# Patient Record
Sex: Female | Born: 1940 | Race: White | Hispanic: No | Smoking: Never smoker
Health system: Southern US, Community
[De-identification: ages and names within clinical notes are randomized; demographics above are authoritative.]

## PROBLEM LIST (undated history)

## (undated) DIAGNOSIS — R0600 Dyspnea, unspecified: Secondary | ICD-10-CM

## (undated) DIAGNOSIS — K5790 Diverticulosis of intestine, part unspecified, without perforation or abscess without bleeding: Secondary | ICD-10-CM

## (undated) DIAGNOSIS — M1990 Unspecified osteoarthritis, unspecified site: Secondary | ICD-10-CM

## (undated) DIAGNOSIS — R413 Other amnesia: Secondary | ICD-10-CM

## (undated) DIAGNOSIS — K219 Gastro-esophageal reflux disease without esophagitis: Secondary | ICD-10-CM

## (undated) DIAGNOSIS — I10 Essential (primary) hypertension: Secondary | ICD-10-CM

## (undated) DIAGNOSIS — Z800 Family history of malignant neoplasm of digestive organs: Secondary | ICD-10-CM

## (undated) DIAGNOSIS — K625 Hemorrhage of anus and rectum: Secondary | ICD-10-CM

## (undated) DIAGNOSIS — E785 Hyperlipidemia, unspecified: Secondary | ICD-10-CM

## (undated) HISTORY — PX: COLONOSCOPY: SHX174

## (undated) HISTORY — DX: Other amnesia: R41.3

## (undated) HISTORY — PX: TOTAL KNEE ARTHROPLASTY: SHX125

## (undated) HISTORY — PX: VAGINAL HYSTERECTOMY: SUR661

## (undated) HISTORY — PX: NASAL SEPTUM SURGERY: SHX37

## (undated) HISTORY — DX: Unspecified osteoarthritis, unspecified site: M19.90

## (undated) HISTORY — DX: Hyperlipidemia, unspecified: E78.5

## (undated) HISTORY — DX: Essential (primary) hypertension: I10

## (undated) HISTORY — DX: Gastro-esophageal reflux disease without esophagitis: K21.9

## (undated) HISTORY — DX: Diverticulosis of intestine, part unspecified, without perforation or abscess without bleeding: K57.90

## (undated) HISTORY — DX: Family history of malignant neoplasm of digestive organs: Z80.0

## (undated) HISTORY — PX: CARPAL TUNNEL RELEASE: SHX101

## (undated) HISTORY — PX: BREAST BIOPSY: SHX20

## (undated) HISTORY — PX: KNEE ARTHROSCOPY: SHX127

---
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
## 2004-10-04 ENCOUNTER — Encounter: Admission: RE | Admit: 2004-10-04 | Discharge: 2005-01-02 | Payer: Self-pay | Admitting: Cardiovascular Disease

## 2005-02-14 ENCOUNTER — Other Ambulatory Visit: Admission: RE | Admit: 2005-02-14 | Discharge: 2005-02-14 | Payer: Self-pay | Admitting: Obstetrics & Gynecology

## 2005-08-23 ENCOUNTER — Emergency Department (HOSPITAL_COMMUNITY): Admission: EM | Admit: 2005-08-23 | Discharge: 2005-08-24 | Payer: Self-pay | Admitting: Emergency Medicine

## 2006-04-18 ENCOUNTER — Ambulatory Visit: Payer: Self-pay | Admitting: Internal Medicine

## 2006-05-30 ENCOUNTER — Ambulatory Visit: Payer: Self-pay | Admitting: Internal Medicine

## 2006-12-25 ENCOUNTER — Ambulatory Visit: Payer: Self-pay | Admitting: Internal Medicine

## 2006-12-25 LAB — CONVERTED CEMR LAB
Basophils Absolute: 0.4 10*3/uL — ABNORMAL HIGH (ref 0.0–0.1)
Basophils Relative: 4.2 % — ABNORMAL HIGH (ref 0.0–1.0)
CO2: 27 meq/L (ref 19–32)
Creatinine, Ser: 0.7 mg/dL (ref 0.4–1.2)
Eosinophils Absolute: 0.3 10*3/uL (ref 0.0–0.6)
GFR calc Af Amer: 108 mL/min
GFR calc non Af Amer: 89 mL/min
Glucose, Bld: 96 mg/dL (ref 70–99)
Lymphocytes Relative: 18.6 % (ref 12.0–46.0)
MCHC: 34.4 g/dL (ref 30.0–36.0)
Monocytes Absolute: 0.5 10*3/uL (ref 0.2–0.7)
Platelets: 234 10*3/uL (ref 150–400)
RBC: 4.2 M/uL (ref 3.87–5.11)
Sodium: 138 meq/L (ref 135–145)
WBC: 9.1 10*3/uL (ref 4.5–10.5)

## 2007-01-02 ENCOUNTER — Ambulatory Visit (HOSPITAL_COMMUNITY): Admission: RE | Admit: 2007-01-02 | Discharge: 2007-01-02 | Payer: Self-pay | Admitting: Internal Medicine

## 2007-02-27 HISTORY — PX: TOTAL KNEE ARTHROPLASTY: SHX125

## 2007-04-22 DIAGNOSIS — E119 Type 2 diabetes mellitus without complications: Secondary | ICD-10-CM

## 2007-04-22 DIAGNOSIS — M129 Arthropathy, unspecified: Secondary | ICD-10-CM | POA: Insufficient documentation

## 2007-04-22 DIAGNOSIS — K5730 Diverticulosis of large intestine without perforation or abscess without bleeding: Secondary | ICD-10-CM | POA: Insufficient documentation

## 2007-04-22 DIAGNOSIS — J329 Chronic sinusitis, unspecified: Secondary | ICD-10-CM | POA: Insufficient documentation

## 2007-04-22 DIAGNOSIS — E785 Hyperlipidemia, unspecified: Secondary | ICD-10-CM

## 2007-04-22 DIAGNOSIS — R030 Elevated blood-pressure reading, without diagnosis of hypertension: Secondary | ICD-10-CM | POA: Insufficient documentation

## 2007-04-22 DIAGNOSIS — E079 Disorder of thyroid, unspecified: Secondary | ICD-10-CM | POA: Insufficient documentation

## 2007-09-22 ENCOUNTER — Telehealth: Payer: Self-pay | Admitting: Internal Medicine

## 2007-10-15 ENCOUNTER — Ambulatory Visit: Payer: Self-pay | Admitting: Internal Medicine

## 2007-12-18 ENCOUNTER — Inpatient Hospital Stay (HOSPITAL_COMMUNITY): Admission: RE | Admit: 2007-12-18 | Discharge: 2007-12-20 | Payer: Self-pay | Admitting: Obstetrics & Gynecology

## 2007-12-18 ENCOUNTER — Encounter (INDEPENDENT_AMBULATORY_CARE_PROVIDER_SITE_OTHER): Payer: Self-pay | Admitting: Obstetrics & Gynecology

## 2008-03-08 ENCOUNTER — Inpatient Hospital Stay (HOSPITAL_COMMUNITY): Admission: RE | Admit: 2008-03-08 | Discharge: 2008-03-11 | Payer: Self-pay | Admitting: Orthopedic Surgery

## 2008-03-26 ENCOUNTER — Ambulatory Visit: Payer: Self-pay | Admitting: Surgery

## 2008-03-26 ENCOUNTER — Ambulatory Visit: Admission: RE | Admit: 2008-03-26 | Discharge: 2008-03-26 | Payer: Self-pay | Admitting: Orthopedic Surgery

## 2008-03-26 ENCOUNTER — Encounter (INDEPENDENT_AMBULATORY_CARE_PROVIDER_SITE_OTHER): Payer: Self-pay | Admitting: Orthopedic Surgery

## 2009-02-04 ENCOUNTER — Encounter: Admission: RE | Admit: 2009-02-04 | Discharge: 2009-02-04 | Payer: Self-pay | Admitting: Family Medicine

## 2010-04-14 ENCOUNTER — Other Ambulatory Visit: Payer: Self-pay | Admitting: Orthopedic Surgery

## 2010-04-14 DIAGNOSIS — M25552 Pain in left hip: Secondary | ICD-10-CM

## 2010-04-16 ENCOUNTER — Ambulatory Visit
Admission: RE | Admit: 2010-04-16 | Discharge: 2010-04-16 | Disposition: A | Discharge status: Home / Self Care | Payer: Medicare Other | Source: Ambulatory Visit | Attending: Orthopedic Surgery | Admitting: Orthopedic Surgery

## 2010-04-16 DIAGNOSIS — M25552 Pain in left hip: Secondary | ICD-10-CM

## 2010-06-12 LAB — GLUCOSE, CAPILLARY
Glucose-Capillary: 148 mg/dL — ABNORMAL HIGH (ref 70–99)
Glucose-Capillary: 150 mg/dL — ABNORMAL HIGH (ref 70–99)
Glucose-Capillary: 153 mg/dL — ABNORMAL HIGH (ref 70–99)
Glucose-Capillary: 156 mg/dL — ABNORMAL HIGH (ref 70–99)
Glucose-Capillary: 162 mg/dL — ABNORMAL HIGH (ref 70–99)
Glucose-Capillary: 172 mg/dL — ABNORMAL HIGH (ref 70–99)
Glucose-Capillary: 286 mg/dL — ABNORMAL HIGH (ref 70–99)
Glucose-Capillary: 96 mg/dL (ref 70–99)

## 2010-06-12 LAB — ABO/RH: ABO/RH(D): O POS

## 2010-06-12 LAB — CBC
HCT: 27.7 % — ABNORMAL LOW (ref 36.0–46.0)
HCT: 37.1 % (ref 36.0–46.0)
Hemoglobin: 9.4 g/dL — ABNORMAL LOW (ref 12.0–15.0)
MCHC: 33.2 g/dL (ref 30.0–36.0)
MCHC: 33.5 g/dL (ref 30.0–36.0)
MCHC: 33.8 g/dL (ref 30.0–36.0)
MCV: 97.5 fL (ref 78.0–100.0)
Platelets: 168 10*3/uL (ref 150–400)
Platelets: 183 10*3/uL (ref 150–400)
RBC: 3.24 MIL/uL — ABNORMAL LOW (ref 3.87–5.11)
RDW: 13.4 % (ref 11.5–15.5)
WBC: 6.5 10*3/uL (ref 4.0–10.5)
WBC: 8.1 10*3/uL (ref 4.0–10.5)
WBC: 9.5 10*3/uL (ref 4.0–10.5)

## 2010-06-12 LAB — COMPREHENSIVE METABOLIC PANEL
ALT: 39 U/L — ABNORMAL HIGH (ref 0–35)
Albumin: 4 g/dL (ref 3.5–5.2)
Alkaline Phosphatase: 69 U/L (ref 39–117)
BUN: 15 mg/dL (ref 6–23)
CO2: 26 mEq/L (ref 19–32)
Glucose, Bld: 124 mg/dL — ABNORMAL HIGH (ref 70–99)
Potassium: 3.6 mEq/L (ref 3.5–5.1)
Total Bilirubin: 0.6 mg/dL (ref 0.3–1.2)

## 2010-06-12 LAB — BASIC METABOLIC PANEL
BUN: 15 mg/dL (ref 6–23)
BUN: 22 mg/dL (ref 6–23)
CO2: 28 mEq/L (ref 19–32)
CO2: 33 mEq/L — ABNORMAL HIGH (ref 19–32)
Chloride: 101 mEq/L (ref 96–112)
Chloride: 96 mEq/L (ref 96–112)
Creatinine, Ser: 0.64 mg/dL (ref 0.4–1.2)
Creatinine, Ser: 1.1 mg/dL (ref 0.4–1.2)
GFR calc Af Amer: 52 mL/min — ABNORMAL LOW (ref >60)
GFR calc Af Amer: >60 mL/min (ref >60)
GFR calc non Af Amer: 43 mL/min — ABNORMAL LOW (ref >60)
Glucose, Bld: 130 mg/dL — ABNORMAL HIGH (ref 70–99)
Glucose, Bld: 178 mg/dL — ABNORMAL HIGH (ref 70–99)
Potassium: 5 mEq/L (ref 3.5–5.1)
Potassium: 5.6 mEq/L — ABNORMAL HIGH (ref 3.5–5.1)
Sodium: 128 mEq/L — ABNORMAL LOW (ref 135–145)
Sodium: 132 mEq/L — ABNORMAL LOW (ref 135–145)
Sodium: 136 mEq/L (ref 135–145)

## 2010-06-12 LAB — PROTIME-INR
INR: 1 (ref 0.00–1.49)
Prothrombin Time: 12.9 seconds (ref 11.6–15.2)
Prothrombin Time: 15.2 seconds (ref 11.6–15.2)

## 2010-06-12 LAB — URINALYSIS, ROUTINE W REFLEX MICROSCOPIC
Bilirubin Urine: NEGATIVE
Hgb urine dipstick: NEGATIVE
pH: 6 (ref 5.0–8.0)

## 2010-06-12 LAB — APTT: aPTT: 22 seconds — ABNORMAL LOW (ref 24–37)

## 2010-07-11 NOTE — Assessment & Plan Note (Signed)
[facility]                         GASTROENTEROLOGY OFFICE NOTE   Michele, Dawson                       MRN:          [id number]  DATE:12/25/2006                            DOB:          07-08-1940    [name] is a 70 year old white Dawson who is an acute walk-in because  of rather acute left upper quadrant and left middle quadrant abdominal  pain that started about 48 hours ago, initially associated with  constipation, subsequently today with 4 diarrheal stools but no visible  blood. There has been no fever. Last colonoscopy in April 2008 showed  moderately severe diverticulosis of the sigmoid colon. This was  confirmed on CT scan of the abdomen in 2002 while patient lived in  [place]. She has had occasional attacks of the pain on this side. Since  she started on liquid diet and took Symax 0.375 mg twice a day, her  symptoms have improved in the last 24 hours. She is on Norvasc 5 mg a  day and lisinopril 20 mg b.i.d. Her blood sugars in the past week have  been somewhat elevated to over 120 to 150 while usually they are around  90. She is on metformin 500 mg 2 twice a day. There is a family history  of colon cancer in her sibling which gives her periodic colonoscopies  every 5 years.   PHYSICAL EXAMINATION:  Blood pressure 138/86, pulse 72 and weight 195  pounds. She was in no distress today.  LUNGS:  Were clear to auscultation.  COR:  With normal S1 and normal S2.  ABDOMEN:  Was soft but very tender in left upper quadrant, left middle  quadrant and also in left lower quadrant in the distribution of the left  colon. Right side and upper and lower quadrants were unremarkable. Bowel  sounds were normal. There was no distention.  RECTAL EXAM:  Was soft, hemoccult negative stool.   IMPRESSION:  A 70 year old white Dawson with acute onset abdominal pain  which is consistent with ischemic colitis versus attack of irritable  bowel syndrome.  Judging by the intensity of the pain and the physical  exam, I believe we are most likely dealing with ischemic colitis. She is  better today. One of the implicating factors in her colitis could be her  blood pressure medications which may cause reactive hypotensive. Her  usual blood pressures run 120/82. Today, it was somewhat elevated.   PLAN:  1. Full liquid diet for the next 24-48 hours.  2. CT scan of the abdomen and pelvis to look for ischemic colitis.  3. CBC and BMET today.  4. Continue Symax and antispasmodics twice a day.  5. Decrease Norvasc to 2.5 mg p.o. daily while continuing lisinopril.  6. Stay hydrated.  7. After reviewing the CT scan, I will touch base with the patient and      see how she is doing.     Michele Dawson. Michele Chance, MD  Electronically Signed    DMB/MedQ  DD: 12/25/2006  DT: 12/26/2006  Job #: [id number]   cc:   Michele Dawson, M.D.

## 2010-07-11 NOTE — Discharge Summary (Signed)
NAME:  Michele Dawson, Michele Dawson              ACCOUNT NO.:  [id number]   MEDICAL RECORD NO.:  [id number]          PATIENT TYPE:  INP   LOCATION:  1604                         FACILITY:  [facility]   PHYSICIAN:  Ollen Gross, M.D.    DATE OF BIRTH:  [birth year]   DATE OF ADMISSION:  03/08/2008  DATE OF DISCHARGE:  03/11/2008                               DISCHARGE SUMMARY   ADMITTING DIAGNOSES:  1. Bilateral knee osteoarthritis, left greater than right.  2. Hypertension.  3. Diabetes.  4. Reflux disease.  5. Hypothyroidism.   DISCHARGE DIAGNOSES:  1. Osteoarthritis left knee, status post left total replacement      arthroplasty.  2. Osteoarthritis right knee.  3. Postoperative hyponatremia, improved.  4. Mild postoperative renal insufficiency, improved.  5. Hypertension.  6. Diabetes.  7. Reflux disease.  8. Hypothyroidism.   PROCEDURE:  On March 08, 2008, left total knee.  Surgeon, Dr. Lequita Halt.  Assistant, Oneida Alar, PA-C.  Spinal anesthesia.   CONSULTS:  None.   BRIEF HISTORY:  Michele Dawson is a 70 year old female with end-stage  arthritis of the left knee with progressive worsening pain and  dysfunction, failed operative management, now presents for total knee  arthroplasty.   LABORATORY DATA:  Preop CBC showed a hemoglobin of 12.3, hematocrit  37.1, white cell count 6.5, platelets 183.  Chem panel, slightly  elevated glucose of 124, slightly elevated AST of 41 and slightly  elevated ALT of 39, otherwise Chem panel within normal limits.  PT/INR  12.9 and 1.0 with a PTT of 22.  Preop UA negative with a section of  trace leukocyte esterase and only 0-2 white cells.   Serial CBCs were followed.  Hemoglobin dropped down to 10.4, it got as  low as 9.4; last H and H with hemoglobin stabilized at 9.4 with  hematocrit of 28.3.  Serial BMETs were followed.  Sodium did drop down  to 132, got as low as 128, was back up to 133.  Preop creatinine was  normal at 0.6, did go up postoperatively to  1.2, came back down to 0.6.  Initial potassium was normal at 3.6, but it went up to 5.6 due to  potassium in the fluid.  We removed that and it came back down to 5.0.   EKG dated January 30, 2008, sinus rhythm, leftward axis confirmed.   HOSPITAL COURSE:  The patient admitted to [facility]  tolerated the procedure well, later transferred to the recovery room and  orthopedic floor.  Started on PCA and p.o. analgesics for pain control  following surgery.  She did have spinal with Duramorph added.  She had  some nausea through the night and on the morning of postop day #1.  It  was felt to be due to probably the Duramorph, which would wear off later  that day.  She had low urinary output and also some hypotension,  systolic in the 90s, so we bolused her with fluids.  She did have an ACE  inhibitor preop which was on hold.  Unfortunately, her normal creatinine  preoperatively of 0.61 went up to 1.2,  indicating some renal  insufficiency.  We did give her fluids that helped with her pressure.  That also increased her output.  She was started back on her home meds,  except the ACE inhibitor and the metformin held postoperatively.  Her  amlodipine was put on a parameter due to the low pressure.  She also had  some elevated potassium, but that was felt to be due to potassium with  fluids, possibly some hemolysis, so we just followed that and rechecked.  The potassium came back down.  By day 2, she was doing better.  The  nausea had resolved.  I figured it was the Duramorph had worn off.  She  started getting up out of bed, walking about 30 feet.  On dressing  change, the incision looked good.  Her creatinine was already back down  to a normal level of 0.6.  Her hemoglobin was down a little bit, down to  9.4.  We put her on some iron.  Renal insufficiency was improving.  Her  systolic was better, up above 841 systolic.  It is felt she is going to  need skilled facility.  She will look  into [facility].  We got the  social worker involved.  On the morning of day 3, the nausea had  completely resolved.  Her hemoglobin was stable at 94.4.  Output was  good, eating and drinking well, tolerating therapy and was transferred  over to [facility] at that time.   DISCHARGE/PLAN:  The patient was transferred over to [facility].   DISCHARGE DIAGNOSES:  Please see above.   DISCHARGE MEDICATIONS:  1. She is on Coumadin protocol.  Please titrate the Coumadin level for      a target INR between 2.0 and 3.0.  She needs to be on Coumadin for      3 weeks from the date of surgery of March 08, 2008.  Please see      bottom of the report for her Coumadin regimen in the hospital.  2. Colace 100 mg p.o. b.i.d.  3. Norvasc 10 mg p.o. daily.  We were holding for a systolic less than      130, due to her postop hypotension.  4. Lipitor 80 mg daily.  5. Synthroid 200 mcg p.o. daily.  6. Glucophage 100 mg p.o. b.i.d.  7. Nexium 40 mg daily.  8. Nu-Iron 150 mg p.o. daily.  She needs to be on this for 3 weeks      from the date of surgery of March 08, 2008, but may be      discontinued after 3 weeks.  9. Percocet 5 mg 1-2 every 4-6 hours need for moderate pain.  10.Tylenol 325 one or two every 4-6 hours as needed for mild pain.  11.Robaxin 500 mg p.o. q.6-8 h., p.r.n. spasm diet.   DIET:  Heart-healthy diabetic diet.   ACTIVITY:  She is weightbearing as tolerated to the left lower  extremity.  Continue gait training, ambulation, ADLs, PT and OT.  Total  knee protocol.  She may start showering; however, do not submerge the  incision under water.   FOLLOW UP:  She needs to follow up 2 weeks from the date of surgery.  Please contact the office at [phone] to help arrange appointment.  Follow-up with this patient [place] [facility] office of [facility].   DISPOSITION:  [facility].   CONDITION ON DISCHARGE:  Improving.   Coumadin regimen.  The INR on admission was  1.0.  She was a given a 5 mg  on the evening of surgery.  On postop day #1, her INR was 1.0.  She was  given another 5 mg.  On postop day #2, the INR was 1.2, slowly coming  up, so she was given 7.5 mg and on the morning of postop day #3, her INR  was slowly increased up to 1.5.      Alexzandrew L. Perkins, P.A.C.      Ollen Gross, M.D.  Electronically Signed    ALP/MEDQ  D:  03/11/2008  T:  03/11/2008  Job:  [id number]   cc:   Ollen Gross, M.D.  Fax: [phone]   Vikki Ports, M.D.  Fax: [phone]   Jake Bathe, MD  Fax: [phone]   [facility]

## 2010-07-11 NOTE — Op Note (Signed)
NAME:  Michele Dawson, Michele Dawson NO.:  [id number]   MEDICAL RECORD NO.:  [id number]          PATIENT TYPE:  INP   LOCATION:  9317                          FACILITY:  [facility]   PHYSICIAN:  Freddy Finner, M.D.   DATE OF BIRTH:  [birth year]   DATE OF PROCEDURE:  12/18/2007  DATE OF DISCHARGE:                               OPERATIVE REPORT   PREOPERATIVE DIAGNOSES:  Symptomatic pelvic relaxation with third-degree  cystocele, second-degree rectocele, second-degree uterine prolapse.   POSTOPERATIVE DIAGNOSES:  Symptomatic pelvic relaxation with third-  degree cystocele, second-degree rectocele, second-degree uterine  prolapse.   OPERATIVE PROCEDURE:  Laparoscopically assisted vaginal hysterectomy,  bilateral salpingo-oophorectomy, anterior and posterior vaginal repair,  sacrospinous ligament suspension of vagina, suprapubic cystotomy with  banana catheter.   ESTIMATED INTRAOPERATIVE BLOOD LOSS:  200 mL.   SURGEON:  Freddy Finner, M.D.   ASSISTANT:  Juluis Mire, M.D.   ANESTHESIA:  General endotracheal.   INTRAOPERATIVE COMPLICATIONS:  None.   DESCRIPTION OF PROCEDURE:  Details of present illness recorded in the  admission note.  The patient was admitted, given a bolus of antibiotic,  placed in serial compression devices on her lower extremities and  brought to the operating room.  She was placed under adequate general  endotracheal anesthesia and placed in the dorsal lithotomy position.  Allen stirrups were used.  Prep was carried out with Hibiclens because  of an allergy to iodine/shell fish.  Sterile drapes were applied.  The  bladder was evacuated with sterile technique.  Hulka tenaculum was  attached to the cervix.  Sterile drapes were applied.  Two small  incisions were made, one at the umbilicus and one just above the  symphysis and an 11-mm bladed disposable trocar was introduced at the  umbilicus while elevating the anterior abdominal wall manually.  Direct  inspection revealed adequate placement with no evidence of injury on  entry.  Pneumoperitoneum was allowed to accumulate using carbon dioxide  gas.  Exposure was compromised by obesity, but was adequate.  A 5-mm  trocar was placed under direct visualization through an incision just  above the symphysis.  Through it, a spring-loaded grasping forceps was  used.  Scanning inspection of the abdomen was carried out.  No apparent  abnormalities were noted either in the pelvis or in the upper abdomen.  The liver was visualized.  The appendix was not visualized.  Using the  Ensure device, the infundibulopelvic ligaments, round ligaments and  upper broad ligaments on each side were progressively sealed and  divided, releasing the tubes and ovaries.  Attention was turned  vaginally.  A posterior weighted retractor was placed.  Hulka was  replaced with a Christella Hartigan on the cervix.  Colpotomy incision was made while  tenting the mucosa posterior to the cervix.  A banana retractor was  placed.  The cervix was circumscribed with a scalpel.  The LigaSure  system was then used to develop uterosacral pedicles, bladder pillars.  The bladder was carefully advanced off the cervix.  The anterior  peritoneum was entered.  Cardinal ligament pedicles and vessel pedicles  were  sealed and divided using LigaSure.  This allowed delivery of the  uterus, tubes and ovaries.  The uterosacrals were plicated with an  interrupted 0 Monocryl.  The cuff was closed posterior one-half with  figure-of-eights of 0 Monocryl.  The edges of the anterior mucosa were  then grasped.  The mucosa was tented over the bladder in the midline.  With progressive sharp and blunt dissection, the bladder was freed from  the urogenital fascia.  Plication sutures of 0 Monocryl were then used  to reduce the cystocele and to elevate the urethrovesical angle.  Urethral patency was confirmed with a sterile Tresa Endo.  The remaining cuff  was closed with a  figure-of-eight of 0 Monocryl.  The anterior incision  was closed after segments of mucosa were excised with a running 2-0  Monocryl.  Posterior repair was then begun.  Allis's were used at the  fourchette.  Pyramidal segment of skin was excised from the perineum.  Mucosa overlying the rectum was tented.  It was dissected to free the  rectum from the mucosa.  The sacrospinous ligament suture was then  placed using Dexon and the __________system.  This was anchored to the  apex of the vagina.  Plication sutures were then used to reapproximate  perirectal tissue overlying the rectum and to recreate the rectovaginal  septum.  No mucosa was excised posteriorly.  The mucosal closure was  started with a running 2-0 Monocryl and after approximately 4 cm, this  suture was tied which dramatically elevated the vagina.  The closure was  then completed using a technique similar to an episiotomy.  Hemostasis  was very good.  No pack was placed.  The bladder was filled with 300 mL  of irrigating solution.  A suprapubic catheter placed in standard  fashion.  Using the banana catheter, it was anchored to the skin with  silk.  Reinspection laparoscopically revealed complete hemostasis.  The  instruments removed.  Gas was allowed to escape from the abdomen.  The  skin incisions were closed with interrupted subcuticular sutures of 3-0  Dexon.  Steri-Strips were applied to the lower incision.  The patient  tolerated the operative procedure well and was awakened and taken to  recovery in good condition.      Freddy Finner, M.D.  Electronically Signed     WRN/MEDQ  D:  12/18/2007  T:  12/18/2007  Job:  [id number]

## 2010-07-11 NOTE — H&P (Signed)
NAME:  Michele Dawson, Michele Dawson NO.:  [id number]   MEDICAL RECORD NO.:  [id number]          PATIENT TYPE:  AMB   LOCATION:  SDC                           FACILITY:  [facility]   PHYSICIAN:  Freddy Finner, M.D.   DATE OF BIRTH:  07-22-1940   DATE OF ADMISSION:  12/18/2007  DATE OF DISCHARGE:                              HISTORY & PHYSICAL   ADMITTING DIAGNOSES:  Symptomatic pelvic relaxation with third degree  cystocele, second degree rectocele, second degree uterine prolapse,  relaxation of the vaginal outlet.   The patient is a 70 year old white divorced female, gravida 2, para 2,  who has been followed in my office since 2006, at which time she  presented with a chief complaint of a feeling of pelvic structures  falling out.  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  Over the interval since  that time, her prolapse symptoms have increased and she has requested  definitive surgical intervention and is admitted at this time for  laparoscopically assisted vaginal hysterectomy, bilateral salpingo-  oophorectomy, anterior and posterior vaginal repair and sacrospinous  ligament suspension of the vagina.  She has been on estrogen vaginal  cream regularly for the last 2-3 weeks.  This is at my direction.   Except for her GYN complaints, the review of systems is negative.  There  are no cardiopulmonary, GI or GU complaints at the present time.   PAST MEDICAL HISTORY:  1. Hypertension for which she takes lisinopril and Norvasc.  2. She has hyperproteinemia for which she takes Lipitor 80 mg a day.  3. She has GERD for which she takes Nexium daily.  4. She has osteopenia for which she takes Evista 60 mg a day and      calcium and vitamin D supplements.  5. She has hypothyroidism for which she takes Synthroid 0.2 mg per      day.  6. She also is known to have adult onset diabetes for which she takes      500 mg of  metformin b.i.d.   She does use alcohol socially.  She does not smoke cigarettes.   PAST SURGICAL HISTORY:  1. Two vaginal deliveries which were uncomplicated.  2. She has had a rhinoplasty for deviated septum.  3. She has had surgical correction for carpal tunnel.  4. She has never had a blood transfusion.   ALLERGIES:  1. PENICILLIN.  2. SULFA.   FAMILY HISTORY:  Noncontributory.   PHYSICAL EXAMINATION:  HEENT:  Grossly within normal limits.  NECK:  To my examination, the thyroid gland is not palpably enlarged.  VITAL SIGNS:  Her blood pressure in the office was 128/90.  Weight 203  pounds.  CHEST:  Clear to auscultation throughout.  HEART:  Normal sinus rhythm without murmurs, rubs or gallops.  BREASTS:  Considered to be normal.  No palpable masses, no nipple  discharge or skin change.  ABDOMEN:  Obese, but soft and nontender.  There is no appreciable  organomegaly or palpable masses.  EXTREMITIES:  Without  clubbing, cyanosis or edema.  PELVIC:  Remarkable for marked pelvic relaxation with third degree  cystocele, second degree rectocele, second degree uterine descensus.  Manual does not reveal enlargement of the uterus or palpable adnexal  masses.  RECTUM:  Palpably normal and confirms the above findings.   ASSESSMENT:  Significant pelvic relaxation with third degree cystocele,  second degree rectocele, uterine descensus with prolapse to the  introitus.  Also remarkable are the LSNA findings, but these have been  well managed and under adequate control with clobetasol ointment on a  regular basis.   PLAN:  Laparoscopically assisted vaginal hysterectomy, anterior and  posterior vaginal repair, sacrospinous ligament suspension of the  vagina.      Freddy Finner, M.D.  Electronically Signed     WRN/MEDQ  D:  12/17/2007  T:  12/17/2007  Job:  [id number]

## 2010-07-11 NOTE — Op Note (Signed)
NAME:  Michele, Dawson              ACCOUNT NO.:  [id number]   MEDICAL RECORD NO.:  [id number]          PATIENT TYPE:  INP   LOCATION:  1604                         [facility]   PHYSICIAN:  Ollen Gross, M.D.    DATE OF BIRTH:  [birth year]   DATE OF PROCEDURE:  03/08/2008  DATE OF DISCHARGE:                               OPERATIVE REPORT   PREOPERATIVE DIAGNOSIS:  Osteoarthritis, left knee.   POSTOPERATIVE DIAGNOSIS:  Osteoarthritis, left knee.   PROCEDURE:  Left total knee arthroplasty.   ASSISTANT:  Jamelle Rushing, P.A.   ANESTHESIA:  Spinal.   BLOOD LOSS:  Minimal.   DRAIN:  None.   TOURNIQUET TIME:  28 minutes at 300 mmHg.   COMPLICATIONS:  None.   CONDITION:  Stable to recovery room.   CLINICAL NOTE:  Michele Dawson is a 70 year old female who has end-stage  arthritis of left knee with progressively worsening pain and  dysfunction.  She has failed nonoperative management and presents now  for total knee arthroplasty.   PROCEDURE IN DETAIL:  After successful administration of spinal  anesthetic, a tourniquet was placed high on her left thigh, and left  lower extremity was prepped and draped in the usual sterile fashion.  The extremity was wrapped in Esmarch, knee flexed, tourniquet inflated  to 300 mmHg.  A midline incision made with 10-blade through subcutaneous  tissue to the level of the extensor mechanism.  A fresh blade was used  make a medial parapatellar arthrotomy.  Soft tissue over the proximal  and medial tibia, subperiosteally elevated to the joint line with the  knife into the semimembranosus bursa with a Cobb elevator.  Soft tissue  laterally was elevated with attention being paid to avoid the patellar  tendon on its tibial tubercle.  The patella subluxed laterally and the  knee flexed to 90 degrees, ACL and PCL removed.  Drill was used to  create a starting hole in the distal femur, and the canal was thoroughly  irrigated.  The 5-degree left valgus  alignment guide was placed, and  referencing off the posterior condyles rotation is marked, and the block  pins removed 10 mm off the distal femur.  Distal femoral resection was  made with an oscillating saw.  Sizing blocks placed and size 4 narrow  was most appropriate.  Size 4 in the AP plane, but 3 in the mediolateral  plane.  The size 4 cutting block was placed with rotation marked at the  epicondylar axis.  The anterior and posterior chamfer cuts were then  made.   Tibia subluxed forward, and the menisci were removed.  The  extramedullary tibial alignment guide was placed, referencing proximally  at the medial aspect of the tibial tubercle and distally along the  second metatarsal axis and tibial crest.  The block was pinned to remove  about 10 mm of the non-deficient lateral side.  Tibial resection was  made with an oscillating saw.  Size 3 was the most appropriate tibial  component and the proximal tibia was repaired to modular drill and keel  punch for the size 3.  Femoral preparation was completed with the  intercondylar cut for the size 4.   Size 3 mobile bearing tibial trial, size 4 narrow posterior stabilized  femoral trial, and a 10-mm posterior stabilized rotating platform insert  trial were placed.  With the 10, full extension was achieved with  excellent varus/valgus and anterior/posterior balance throughout full  range of motion.  The patella was everted and the thickness measured to  be 21 mm.  Freehand resection was taken at 12 mm, 38 template was  placed, lug holes were drilled, trial patella was placed, and it tracks  normally.  Osteophytes removed off the posterior femur with the trial in  place.  All trials were removed and the cut bone surfaces were prepared  with pulsatile lavage.  Cement was mixed and once ready for implantation  a size 3 mobile bearing tibial tray, size 4 narrow posterior stabilized  femur, and 38 patella were cemented into place, and the  patella was held  with a clamp.  Trial 10-mm inserts placed, knee held in full extension,  and all extruded cement removed.  When the cement fully hardened, then  the permanent 10-mm posterior stabilized rotating platform insert was  placed into the tibial tray.  The wounds copiously irrigated with saline  solution.  FloSeal injected on the posterior capsule, medial and lateral  gutters and suprapatellar area.   A moist sponge was placed, and tourniquet released for a total  tourniquet time of 28 minutes.  Sponge is held for 2 minutes and then  removed.  Minimal bleeding was encountered.  The bleeding that was  encountered was stopped with electrocautery.  The wound is again  irrigated, and the arthrotomy closed with interrupted #1 PDS.  Flexion  against gravity was 135 degrees.  Subcu was then closed with interrupted  2-0 Vicryl and subcuticular running 4-0 Monocryl.  The incision is  cleaned and dried, and Steri-Strips and a bulky sterile dressing were  applied.  She was then placed into a knee immobilizer, awakened, and  transferred to recovery in stable condition.      Ollen Gross, M.D.  Electronically Signed     FA/MEDQ  D:  03/08/2008  T:  03/08/2008  Job:  [id number]

## 2010-07-11 NOTE — H&P (Signed)
NAME:  Michele Dawson, Michele Dawson NO.:  [id number]   MEDICAL RECORD NO.:  [id number]          PATIENT TYPE:  INP   LOCATION:                               FACILITY:  [facility]   PHYSICIAN:  Ollen Gross, M.D.    DATE OF BIRTH:  1940-12-14   DATE OF ADMISSION:  03/08/2008  DATE OF DISCHARGE:                              HISTORY & PHYSICAL   CHIEF COMPLAINT:  Bilateral knee pain left greater than right.   PRESENT ILLNESS:  The patient is a 70 year old female with several years  of progressively worsening pain in bilateral knees.  The left knee is  bothering her worse than the right.  She has pain with weightbearing and  range of motion.  She has failed conservative treatment.  X-ray shows  that she is bone-on-bone medial compartment left knee.   ALLERGIES:  SULFA AND PENICILLIN CAUSING RASH.   FOOD ALLERGIES:  SHELLFISH.   CURRENT MEDICATIONS:  1. Evista 60 mg a day.  2. Lipitor 80 mg a day.  3. Nexium 40 mg a day.  4. Synthroid 0.2 mg a day.  5. Lisinopril 20 mg twice a day.  6. Metformin 500 mg twice a day.  7. Norvasc 10 mg a day.  8. Simply Sleep 1 tablet at nightly.  9. Mobic 7.5 mg a day.  10.Calcium with magnesium once a day.  11.Vitamin D once a day.  12.Aspirin 81 mg a day.  13.Vagifem every other day.   CURRENT MEDICAL HISTORY:  1. Hypertension.  2. Reflux disease.  3. Diabetes.  4. Thyroid disease.   PRIMARY CARE PHYSICIAN:  Dr. Theresia Lo.   CARDIOLOGIST:  Dr. Donato Schultz from [facility].   REVIEW OF SYSTEMS:  GENERAL:  She does have occasional worn out, tired  feeling.  HEENT:  She does have some balance issues, at times feels like  she is off gait, is not sure if just related to her knees giving out on  her.  DERMATOLOGIC:  Unremarkable.  RESPIRATORY:  Unremarkable.  CARDIOVASCULAR:  Unremarkable.  GI:  She has occasional diarrhea,  constipation but the Nexium works well on her previous gastritis.  GU:  She does have occasional increased  urinary frequency, she was just  treated for a UTI back in November after her hysterectomy.  No recurrent  urinary tract infection-type complaints.   PAST SURGICAL HISTORY:  1. She has had 3 children.  2. She has had a deviated septum repaired.  3. Carpal tunnel repair.  4. Hysterectomy without any complications with anesthesia.   FAMILY MEDICAL HISTORY:  Father is deceased from complications of  emphysema and tongue cancer.  Mother is deceased from complications from  breast cancer.  One sibling deceased from colon cancer.  Two children  alive and well.   SOCIAL HISTORY:  Patient is divorced.  She is retired.  She does work  part-time in [employer] at [employer].  She has never smoked.  She  does have an occasional alcoholic beverage.  She has got two grown  children but she lives alone in a [place] house.  She would like  see  about skilled nursing facility postoperatively.  She does have a living  will and a power of attorney.   PHYSICAL EXAM:  VITALS:  Height is 5 foot 7, weight is 206 pounds,  patient is afebrile, pulse of 70, respirations 12, the patient's blood  pressure is 132/70.  GENERAL:  This is a healthy-appearing, slightly centrally obese female,  conscious, alert and appropriate, appears to be good historian.  NECK:  Supple, no palpable lymphadenopathy, good range of motion.  CHEST:  Lung sounds were clear and equal bilaterally.  No wheezes,  rales, rhonchi.  HEART:  Regular rate and rhythm.  ABDOMEN:  Soft, nontender.  Bowel sounds present.  EXTREMITIES:  Upper extremities had excellent range of motion of her  shoulders, elbows and wrists.  Motor strength is 5/5.  LOWER EXTREMITIES:  Both hips had full extension, flexion up to 120, 30  degrees of internal-external rotation without any discomfort.  Bilateral  knees were slightly boggy appearing, no signs of erythema or ecchymosis.  She had full extension.  She was able to flex both knees back to 130.  She was  tender along the medial joint line on the left.  She did have  crepitus in knee, the calf was soft and nontender.  NEURO:  The patient was conscious, alert and appropriate.  No gross  neurologic defects noted.  PERIPHERAL VASCULAR:  Carotid pulses were 2+, no bruits.  Radial pulses  were 2+.  Dorsalis pedis pulses were 1+.  She had trace edema bilateral  lower extremities.  No venous stasis changes.  Breast, rectal and GU exams were deferred at this time.   IMPRESSION:  1. End-stage osteoarthritis left knee, severe osteoarthritis right      knee.  2. Hypertension.  3. Diabetes.  4. Reflux disease.  5. Thyroid disease.  6. Recent stress test done on February 23, 2008 interpreted as normal      myocardial perfusion without any evidence of ischemia or infarct.      Post stress test left ventricular size is normal.  Post stress test      ejection fraction was 62%.  Unremarkable pharmacologic stress test.      No cardiac contraindications to planned surgery.   PLAN:  The patient will undergo all routine labs and tests prior to  having a left total knee arthroplasty by Dr. Lequita Halt at [facility] on March 08, 2008.      Jamelle Rushing, P.A.      Ollen Gross, M.D.  Electronically Signed    RWK/MEDQ  D:  02/26/2008  T:  02/26/2008  Job:  [id number]

## 2010-07-14 NOTE — Discharge Summary (Signed)
NAME:  Michele Dawson, RHATIGAN NO.:  [id number]   MEDICAL RECORD NO.:  [id number]          PATIENT TYPE:  INP   LOCATION:  9317                          FACILITY:  [facility]   PHYSICIAN:  Freddy Finner, M.D.   DATE OF BIRTH:  10/19/1940   DATE OF ADMISSION:  12/18/2007  DATE OF DISCHARGE:  12/20/2007                               DISCHARGE SUMMARY   DISCHARGE DIAGNOSES:  1. Symptomatic pelvic relaxation with third-degree cystocele.  2. Second-degree rectocele.  3. Second-degree uterine prolapse.  4. Relaxation of the vaginal introitus.   OPERATIVE PROCEDURES:  1. Anterior and posterior vaginal repair.  2. Laparoscopic-assisted vaginal hysterectomy.  3. Sacrospinous ligament suspension of vagina.   INTRAOPERATIVE AND POSTOPERATIVE COMPLICATIONS:  None.   Histologic examination of surgically removed tissues were benign with no  abnormal findings except for atrophy.   DISPOSITION:  The patient was in satisfactory improved condition at the  time of her discharge.  She is to resume all of her preoperative  medications.  She has numerous medical diagnoses outlined in the present  illness and past history.  She is to return to the office in 2 weeks for  her first postoperative visit.   HOSPITAL COURSE:  The patient was admitted on the morning of surgery.  Preoperative laboratory studies included normal CBC, normal EKG, normal  Chem-6 except for slight elevation of glucose to 121.  Prothrombin time  and PTT were normal.  The patient was treated perioperatively with IV  antibiotics and with PAS compression hose.  She was taken to the  operating room where the above-described surgical procedure was  accomplished.  There were no intraoperative complications and no  postoperative complications except for the presence of a very small  induration at the right sacrospinous ligament at the location of  replacement was suspension suture, this was stable and not  hemodynamically  significant.  This did not appear to be the reason for  her complaint of low back pain.  This progressively improved.  By the  second postoperative day, her condition was considered to be  satisfactory for discharge.  Her suprapubic catheter was removed.  She  was on voiding without difficulty.  She was discharged home with  disposition as noted above.      Freddy Finner, M.D.  Electronically Signed     WRN/MEDQ  D:  02/13/2008  T:  02/14/2008  Job:  [id number]

## 2010-07-14 NOTE — Assessment & Plan Note (Signed)
[facility]                         GASTROENTEROLOGY OFFICE NOTE   Michele Dawson, Michele Dawson                       MRN:          [id number]  DATE:04/18/2006                            DOB:          21-Jun-1940    Ms. Smay is a very nice 70 year old white female who is here today to  discuss colorectal screening.  She moved to [place] two years ago  from [place], where she worked for [employer].  She had a  colonoscopy in June 2002, by Dr. Demetrios Isaacs at [facility] in  [place].  We have obtained the report of the colonoscopy which showed  diverticulosis of the left colon but no polyps.  She at the same time  had an upper endoscopy showing mild gastritis.  Ms. Gilbertson sister had a  colon cancer approximately 20 years ago and has done very well.  She  lives in [place] and has been having colonoscopies on a yearly basis.  Ms. Mcbeth problem is rectal irritation which she attributes to  hemorrhoids and also constipation.  She deals with it by using a lot of  fiber and prunes on a daily basis.  She has intentionally lost about 50  pounds last year and has maintained the weight.   MEDICATIONS:  1. Norvasc 5 mg p.o. every day.  2. Lisinopril 20 mg p.o. b.i.d.  3. Synthroid 0.2 mg every day.  4. Lipitor 80 mg p.o. every day.  5. Nexium 40 mg p.o. every day.  6. Celebrex 200 mg p.o. every day.  7. Evista 60 mg p.o. every day.  8. Metformin 1 gram b.i.d.  9. Citrucel twice a day.  10.Caltrate twice a day.   PAST HISTORY:  1. High blood pressure.  2. Diabetes.  3. Hyperlipidemia.  4. Arthritis.  5. Thyroid problems.  6. Sinus trouble.   OPERATIONS:  1. Deviated septum, 1978.  2. Carpal tunnel on the left wrist in 2004.   FAMILY HISTORY:  Positive for breast cancer in her mother and two aunts.  Alcoholism in father.  Colon cancer in her sister.   SOCIAL HISTORY:  Divorced with 2 children, retired from [employer].  She has a  Transport planner.  She does not smoke.  Drinks alcohol  socially.   REVIEW OF SYSTEMS:  Positive for eyeglasses, arthritic complaints,  vision changes, back pain, muscle cramps.   PHYSICAL EXAMINATION:  VITAL SIGNS:  Blood pressure 120/82, pulse 80,  weight 188 pounds.  GENERAL:  She was alert, oriented, no distress.  HEENT:  Sclerae anicteric.  NECK:  Supple.  No adenopathy.  LUNGS:  Clear to auscultation.  COR:  Normal S1, normal S2.  ABDOMEN:  Soft, nontender with normoactive bowel sounds.  Liver edge at  the costal margin.  No splenic tip.  Low abdomen was normal.  No  fullness.  RECTAL:  Showed a normal perianal area with normal exit tones.  Stool  was soft, hemoccult negative.  No prolapsed hemorrhoids.   IMPRESSION:  47. A 70 year old white female with functional constipation and rectal      irrigation which may be related to  her straining.  She has some      symptom of pelvic relaxation.  2. Positive family history of colon cancer in her sister.  3. Known diverticulosis of the left colon based on colonoscopy in June      2002.   PLAN:  1. Continue high fiber diet and prunes.  2. Continue Citrucel.  3. Colonoscopy scheduled.  4. Samples of Analpram cream 2.5% given p.r.n. rectal irritation.     Hedwig Morton. Juanda Chance, MD  Electronically Signed    DMB/MedQ  DD: 04/18/2006  DT: 04/18/2006  Job #: [id number]   cc:   Vikki Ports, M.D.

## 2010-09-02 LAB — POC HEMOCCULT BLD/STL (HOME/3-CARD/SCREEN)

## 2010-09-08 ENCOUNTER — Telehealth: Payer: Self-pay | Admitting: Internal Medicine

## 2010-09-08 NOTE — Telephone Encounter (Signed)
Spoke with Clydie Braun and gave her the appointment time. Clydie Braun to fax records.

## 2010-09-08 NOTE — Telephone Encounter (Signed)
Scheduled patient on 09/25/10 at 8:45 AM.Left a message for Clydie Braun to call me.

## 2010-09-25 ENCOUNTER — Ambulatory Visit: Payer: Medicare Other | Admitting: Internal Medicine

## 2010-10-05 ENCOUNTER — Ambulatory Visit (INDEPENDENT_AMBULATORY_CARE_PROVIDER_SITE_OTHER): Payer: Medicare Other | Admitting: Internal Medicine

## 2010-10-05 ENCOUNTER — Encounter: Payer: Self-pay | Admitting: Internal Medicine

## 2010-10-05 VITALS — BP 140/76 | HR 68 | Ht 68.0 in | Wt 218.0 lb

## 2010-10-05 DIAGNOSIS — Z800 Family history of malignant neoplasm of digestive organs: Secondary | ICD-10-CM

## 2010-10-05 DIAGNOSIS — R195 Other fecal abnormalities: Secondary | ICD-10-CM

## 2010-10-05 MED ORDER — PEG-KCL-NACL-NASULF-NA ASC-C 100 G PO SOLR: 1.0000 | Freq: Once | ORAL | Status: DC

## 2010-10-05 NOTE — Patient Instructions (Addendum)
You have been scheduled for a colonoscopy. Please follow written instructions given to you at your visit today.  Please pick up your Moviprep kit at the pharmacy within the next 2-3 days. Dr Sigmund Hazel, Dr Richardean Chimera

## 2010-10-05 NOTE — Progress Notes (Signed)
Michele Dawson 1940/04/29 MRN [id number]     History of Present Illness:  This is a 70 year old white female with heme-positive stool tests for occult blood. Patient says she was constipated when she did the test. She denies seeing any bright red blood per rectum. She has occasional constipation for which she uses prunes. There is a family history of colon cancer in her sister who had a sigmoid resection 20 years ago. Patient had a colonoscopy in April 2008 and prior to that in July 2002 in [place]. No polyps were found.   Past Medical History  Diagnosis Date  . Arthritis   . Hyperlipidemia   . Diabetes mellitus   . Family history of malignant neoplasm of gastrointestinal tract   . Diverticulosis   . Hypertension   . GERD (gastroesophageal reflux disease)    Past Surgical History  Procedure Date  . Nasal septum surgery   . Carpal tunnel release     left  . Vaginal hysterectomy     with anterior and posterior vaginal repair    reports that she has never smoked. She has never used smokeless tobacco. She reports that she drinks alcohol. She reports that she does not use illicit drugs. family history includes Breast cancer in her mother and unspecified family member; Colon cancer in her sister; Heart disease in her father; and Tongue cancer in her father. Allergies  Allergen Reactions  . Penicillins     REACTION: tash        Review of Systems: No dysphagia, odynophagia, shortness of breath or chest pain.  The remainder of the 10  point ROS is negative except as outlined in H&P   Physical Exam: General appearance  Well developed in no distress. Eyes- non icteric. HEENT nontraumatic, normocephalic. Mouth no lesions, tongue papillated, no cheilosis. Neck supple without adenopathy, thyroid not enlarged, no carotid bruits, no JVD. Lungs Clear to auscultation bilaterally. Cor normal S1 normal S2, regular rhythm , no murmur,  quiet precordium. Abdomen obese, soft, nontender  with normoactive bowel sounds. Minimal discomfort left lower quadrant. No palpable mass. Rectal: Very small stool specimen normal perianal area and normal rectal tone. Stool is Hemoccult negative. Extremities no pedal edema. Skin no lesions. Neurological alert and oriented x 3. Psychological normal mood and affect.  Assessment and Plan:  Problem #1 Patient is at high risk for colon cancer because of her family history. She will be due for a colonoscopy in April 2013. She has had Hemoccult-positive stool cards. Although she is Hemoccult negative on my exam today I think it would be safer for her to undergo a followup colonoscopy at this time rather than waiting until the spring of 2013. She agreed with the plan. We will schedule her for an outpatient colonoscopy.  10/05/2010 Lina Sar

## 2010-11-21 ENCOUNTER — Encounter: Payer: Self-pay | Admitting: Internal Medicine

## 2010-11-21 ENCOUNTER — Ambulatory Visit (AMBULATORY_SURGERY_CENTER): Payer: Medicare Other | Admitting: Internal Medicine

## 2010-11-21 VITALS — BP 134/91 | HR 86 | Temp 97.7°F | Resp 13 | Ht 67.0 in | Wt 206.2 lb

## 2010-11-21 DIAGNOSIS — D126 Benign neoplasm of colon, unspecified: Secondary | ICD-10-CM

## 2010-11-21 DIAGNOSIS — R195 Other fecal abnormalities: Secondary | ICD-10-CM

## 2010-11-21 LAB — GLUCOSE, CAPILLARY: Glucose-Capillary: 147 mg/dL — ABNORMAL HIGH (ref 70–99)

## 2010-11-21 MED ORDER — SODIUM CHLORIDE 0.9 % IV SOLN: 500.0000 mL | INTRAVENOUS | Status: DC

## 2010-11-21 NOTE — Patient Instructions (Signed)
Discharge instructions given with verbal understanding.  Handouts on polyps and diverticulosis given.  Resume previous medications. 

## 2010-11-22 ENCOUNTER — Telehealth: Payer: Self-pay | Admitting: *Deleted

## 2010-11-22 NOTE — Telephone Encounter (Signed)
No id on voicemail . No mess. Left.

## 2010-11-27 ENCOUNTER — Encounter: Payer: Self-pay | Admitting: Internal Medicine

## 2010-11-28 LAB — CBC
HCT: 32.5 — ABNORMAL LOW
Hemoglobin: 10.8 — ABNORMAL LOW
MCV: 97.1
MCV: 98.5
RBC: 4.17
RDW: 13.9
WBC: 8.1

## 2010-11-28 LAB — PROTIME-INR
INR: 1
Prothrombin Time: 12.8

## 2010-11-28 LAB — BASIC METABOLIC PANEL
Calcium: 9.6
GFR calc Af Amer: >60
GFR calc non Af Amer: >60
Sodium: 137

## 2010-11-28 LAB — GLUCOSE, CAPILLARY: Glucose-Capillary: 122 — ABNORMAL HIGH

## 2010-11-30 ENCOUNTER — Telehealth: Payer: Self-pay | Admitting: Internal Medicine

## 2010-11-30 MED ORDER — DICYCLOMINE HCL 10 MG PO CAPS: ORAL_CAPSULE | ORAL | Status: DC

## 2010-11-30 NOTE — Telephone Encounter (Signed)
I have reviewed OV from 09/2010 and colon from 11/21/2010.Marland Kitchen Please start Bentyl 10 mg po tid, #30, and Metamucil 1 tsp po qg, also advice to take Activia yogurt 3 x /week  or an Probiotic to reestablish normal bacteria./ Her polyps were benign

## 2010-11-30 NOTE — Telephone Encounter (Signed)
Patient notified of Dr. Brodie's recommendations. Rx sent. 

## 2010-11-30 NOTE — Telephone Encounter (Signed)
Patient calling to report nausea, diarrhea and gas 10 days post colonoscopy. States she had diarrhea 6 times yesterday but none today. She states she had some diarrhea on Tuesday also. Nausea is off and on but anything greasy causes nausea. Denies vomiting. States she is sore on left side to her back area. States she always is sore in this area when she has GI problems.  Colonoscopy  Was on 11/21/10. Please, advise.

## 2011-01-24 ENCOUNTER — Other Ambulatory Visit: Payer: Self-pay | Admitting: Internal Medicine

## 2011-01-29 ENCOUNTER — Encounter: Payer: Self-pay | Admitting: Internal Medicine

## 2011-01-29 ENCOUNTER — Ambulatory Visit (INDEPENDENT_AMBULATORY_CARE_PROVIDER_SITE_OTHER): Payer: Medicare Other | Admitting: Internal Medicine

## 2011-01-29 ENCOUNTER — Other Ambulatory Visit (INDEPENDENT_AMBULATORY_CARE_PROVIDER_SITE_OTHER): Payer: Medicare Other

## 2011-01-29 VITALS — BP 142/90 | HR 97 | Ht 67.0 in | Wt 219.8 lb

## 2011-01-29 DIAGNOSIS — R1012 Left upper quadrant pain: Secondary | ICD-10-CM

## 2011-01-29 DIAGNOSIS — R109 Unspecified abdominal pain: Secondary | ICD-10-CM

## 2011-01-29 MED ORDER — DICYCLOMINE HCL 10 MG PO CAPS: 10.0000 mg | ORAL_CAPSULE | Freq: Two times a day (BID) | ORAL | Status: DC

## 2011-01-29 MED ORDER — ALIGN 4 MG PO CAPS: 1.0000 | ORAL_CAPSULE | ORAL | Status: DC

## 2011-01-29 NOTE — Progress Notes (Signed)
Michele Dawson [birth year] MRN [id number]        History of Present Illness:  This is a 70 year old to all white female 3 months post  screening colonoscopy.she has a family history of colorectal neoplasia and personal history of colon polyps. She was found to have adenomatous polyps. She called  several weeks later with complaints ofleft upper quadrant pain and tenderness radiating to her back along the left costal margin. This has persisted for almost 3 months since the colonoscopy. She initially also complained of "gas, diarrhea and bloating which responded to probiotics and Bentyl 10 mg twice a day. there was no fever, rectal bleeding nausea or vomiting    Past Medical History  Diagnosis Date  . Arthritis   . Hyperlipidemia   . Diabetes mellitus   . Family history of malignant neoplasm of gastrointestinal tract   . Diverticulosis   . Hypertension   . GERD (gastroesophageal reflux disease)    Past Surgical History  Procedure Date  . Nasal septum surgery   . Carpal tunnel release     left  . Vaginal hysterectomy     with anterior and posterior vaginal repair  . Total knee arthroplasty     left    reports that she has never smoked. She has never used smokeless tobacco. She reports that she drinks alcohol. She reports that she does not use illicit drugs. family history includes Breast cancer in her mother and unspecified family member; Colon cancer in her sister; Heart disease in her father; and Tongue cancer in her father. Allergies  Allergen Reactions  . Penicillins     REACTION: tash  . Shellfish Allergy     hives  . Sulfa Antibiotics     Hives/blotches        Review of Systems:  The remainder of the 10 point ROS is negative except as outlined in H&P   Physical Exam: General appearance  Well developed, in no distress. Eyes- non icteric. HEENT nontraumatic, normocephalic. Mouth no lesions, tongue papillated, no cheilosis. Neck supple without adenopathy,  thyroid not enlarged, no carotid bruits, no JVD. Lungs Clear to auscultation bilaterally. Cor normal S1, normal S2, regular rhythm, no murmur,  quiet precordium. Abdomen: Up with mild tenderness along the left costal margin. There is no fullness rebound. Exam of the rib cage reveals marked tenderness over the costochondral junction at the left costal margin as well as laterally. and to the left costovertebral angle. Does have point tenderness along lower thoracic spine Rectal: Not done Extremities no pedal edema. Skin no lesions. Neurological alert and oriented x 3. Psychological normal mood and affect.  Assessment and Plan:  Postprocedure residual abdominal and chest tenderness and CVA tenderness which initially started with the bloating diarrhea and dyspepsia. The symptoms might have been related to the prep which according to the patient was very difficult to digest. It could be related to abdominal pressure during the procedure, it may explain the rectal tenderness. 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 She has been definitely improved on probiotics and Bentyl. The costo chondral junction tenderness may be related to abdominal pressure exerted during the procedure. We will proceed with a CT scan of the abdomen to visualize the left upper quadrant and the lower chest. We will refill her Bentyl and asked her to continue probiotics   01/29/2011 Lina Sar

## 2011-01-29 NOTE — Patient Instructions (Addendum)
Your physician has requested that you go to the basement for the following lab work before leaving today: BUN, Creatinine You have been scheduled for a CT scan of the abdomen and pelvis at [facility] ([address]---this is in the same building as Architectural technologist).   You are scheduled on _________at____________ . You should arrive 15 minutes prior to your appointment time for registration. Please follow the written instructions below on the day of your exam:  WARNING: IF YOU ARE ALLERGIC TO IODINE/X-RAY DYE, PLEASE NOTIFY RADIOLOGY IMMEDIATELY AT [phone]! YOU WILL BE GIVEN A 13 HOUR PREMEDICATION PREP.  1) Do not eat or drink anything after ________ (4 hours prior to your test) 2) You have been given 2 bottles of oral contrast to drink. The solution may taste better if refrigerated, but do NOT add ice or any other liquid to this solution. Shake well before drinking.    Drink 1 bottle of contrast @ __________ (2 hours prior to your exam)  Drink 1 bottle of contrast @ __________ (1 hour prior to your exam)  You may take any medications as prescribed with a small amount of water except for the following: Metformin, Glucophage, Glucovance, Avandamet, Riomet, Fortamet, Actoplus Met, Janumet, Glumetza or Metaglip. The above medications must be held the day of the exam AND 48 hours after the exam.  The purpose of you drinking the oral contrast is to aid in the visualization of your intestinal tract. The contrast solution may cause some diarrhea. Before your exam is started, you will be given a small amount of fluid to drink. Depending on your individual set of symptoms, you may also receive an intravenous injection of x-ray contrast/dye. Plan on being at [facility] for 30 minutes or long, depending on the type of exam you are having performed.  If you have any questions regarding your exam or if you need to reschedule, you may call the CT department at [phone] between  the hours of 8:00 am and 5:00 pm, Monday-Friday.  ________________________________________________________________________ We have given you samples of Align. This puts good bacteria back into your intestines. You should take 1 capsule by mouth once daily. If this works well for you, it can be purchased over the counter. CC: Dr Hyacinth Meeker

## 2011-01-30 ENCOUNTER — Telehealth: Payer: Self-pay | Admitting: Internal Medicine

## 2011-01-30 LAB — CREATININE, SERUM: Creatinine, Ser: 0.7 mg/dL (ref 0.4–1.2)

## 2011-01-30 NOTE — Telephone Encounter (Signed)
Question answered. 

## 2011-02-01 ENCOUNTER — Ambulatory Visit (INDEPENDENT_AMBULATORY_CARE_PROVIDER_SITE_OTHER)
Admission: RE | Admit: 2011-02-01 | Discharge: 2011-02-01 | Disposition: A | Discharge status: Home / Self Care | Payer: Medicare Other | Source: Ambulatory Visit | Attending: Internal Medicine | Admitting: Internal Medicine

## 2011-02-01 DIAGNOSIS — R109 Unspecified abdominal pain: Secondary | ICD-10-CM

## 2011-02-01 MED ORDER — IOHEXOL 300 MG/ML  SOLN
100.0000 mL | Freq: Once | INTRAMUSCULAR | Status: AC | PRN
  Administered 2011-02-01: 100 mL via INTRAVENOUS

## 2011-04-18 DIAGNOSIS — I889 Nonspecific lymphadenitis, unspecified: Secondary | ICD-10-CM | POA: Diagnosis not present

## 2011-04-18 DIAGNOSIS — R51 Headache: Secondary | ICD-10-CM | POA: Diagnosis not present

## 2011-05-01 DIAGNOSIS — R7309 Other abnormal glucose: Secondary | ICD-10-CM | POA: Diagnosis not present

## 2011-05-01 DIAGNOSIS — R51 Headache: Secondary | ICD-10-CM | POA: Diagnosis not present

## 2011-05-01 DIAGNOSIS — E039 Hypothyroidism, unspecified: Secondary | ICD-10-CM | POA: Diagnosis not present

## 2011-05-01 DIAGNOSIS — E119 Type 2 diabetes mellitus without complications: Secondary | ICD-10-CM | POA: Diagnosis not present

## 2011-05-04 ENCOUNTER — Other Ambulatory Visit: Payer: Self-pay | Admitting: Family Medicine

## 2011-05-10 ENCOUNTER — Ambulatory Visit
Admission: RE | Admit: 2011-05-10 | Discharge: 2011-05-10 | Disposition: A | Discharge status: Home / Self Care | Payer: Medicare Other | Source: Ambulatory Visit | Attending: Family Medicine | Admitting: Family Medicine

## 2011-05-10 DIAGNOSIS — R51 Headache: Secondary | ICD-10-CM | POA: Diagnosis not present

## 2011-05-17 DIAGNOSIS — L259 Unspecified contact dermatitis, unspecified cause: Secondary | ICD-10-CM | POA: Diagnosis not present

## 2011-08-13 DIAGNOSIS — E119 Type 2 diabetes mellitus without complications: Secondary | ICD-10-CM | POA: Diagnosis not present

## 2011-08-13 DIAGNOSIS — I10 Essential (primary) hypertension: Secondary | ICD-10-CM | POA: Diagnosis not present

## 2011-08-13 DIAGNOSIS — M25519 Pain in unspecified shoulder: Secondary | ICD-10-CM | POA: Diagnosis not present

## 2011-08-13 DIAGNOSIS — Z23 Encounter for immunization: Secondary | ICD-10-CM | POA: Diagnosis not present

## 2011-08-13 DIAGNOSIS — K219 Gastro-esophageal reflux disease without esophagitis: Secondary | ICD-10-CM | POA: Diagnosis not present

## 2011-08-13 DIAGNOSIS — E039 Hypothyroidism, unspecified: Secondary | ICD-10-CM | POA: Diagnosis not present

## 2011-08-13 DIAGNOSIS — E7800 Pure hypercholesterolemia, unspecified: Secondary | ICD-10-CM | POA: Diagnosis not present

## 2011-08-28 DIAGNOSIS — E119 Type 2 diabetes mellitus without complications: Secondary | ICD-10-CM | POA: Diagnosis not present

## 2011-09-03 DIAGNOSIS — Z124 Encounter for screening for malignant neoplasm of cervix: Secondary | ICD-10-CM | POA: Diagnosis not present

## 2011-09-03 DIAGNOSIS — Z1231 Encounter for screening mammogram for malignant neoplasm of breast: Secondary | ICD-10-CM | POA: Diagnosis not present

## 2011-09-04 ENCOUNTER — Ambulatory Visit: Payer: Medicare Other | Attending: Family Medicine | Admitting: Physical Therapy

## 2011-09-04 DIAGNOSIS — M25519 Pain in unspecified shoulder: Secondary | ICD-10-CM | POA: Insufficient documentation

## 2011-09-04 DIAGNOSIS — IMO0001 Reserved for inherently not codable concepts without codable children: Secondary | ICD-10-CM | POA: Insufficient documentation

## 2011-09-04 DIAGNOSIS — M25619 Stiffness of unspecified shoulder, not elsewhere classified: Secondary | ICD-10-CM | POA: Diagnosis not present

## 2011-09-05 ENCOUNTER — Ambulatory Visit: Payer: Medicare Other | Admitting: Physical Therapy

## 2011-09-05 DIAGNOSIS — IMO0001 Reserved for inherently not codable concepts without codable children: Secondary | ICD-10-CM | POA: Diagnosis not present

## 2011-09-05 DIAGNOSIS — M25519 Pain in unspecified shoulder: Secondary | ICD-10-CM | POA: Diagnosis not present

## 2011-09-05 DIAGNOSIS — M25619 Stiffness of unspecified shoulder, not elsewhere classified: Secondary | ICD-10-CM | POA: Diagnosis not present

## 2011-09-10 ENCOUNTER — Ambulatory Visit: Payer: Medicare Other | Admitting: Physical Therapy

## 2011-09-10 DIAGNOSIS — M25519 Pain in unspecified shoulder: Secondary | ICD-10-CM | POA: Diagnosis not present

## 2011-09-10 DIAGNOSIS — IMO0001 Reserved for inherently not codable concepts without codable children: Secondary | ICD-10-CM | POA: Diagnosis not present

## 2011-09-10 DIAGNOSIS — M25619 Stiffness of unspecified shoulder, not elsewhere classified: Secondary | ICD-10-CM | POA: Diagnosis not present

## 2011-09-13 ENCOUNTER — Ambulatory Visit: Payer: Medicare Other | Admitting: Physical Therapy

## 2011-09-13 DIAGNOSIS — M25519 Pain in unspecified shoulder: Secondary | ICD-10-CM | POA: Diagnosis not present

## 2011-09-13 DIAGNOSIS — IMO0001 Reserved for inherently not codable concepts without codable children: Secondary | ICD-10-CM | POA: Diagnosis not present

## 2011-09-13 DIAGNOSIS — M25619 Stiffness of unspecified shoulder, not elsewhere classified: Secondary | ICD-10-CM | POA: Diagnosis not present

## 2011-09-17 ENCOUNTER — Ambulatory Visit: Payer: Medicare Other | Admitting: Physical Therapy

## 2011-09-17 DIAGNOSIS — M25519 Pain in unspecified shoulder: Secondary | ICD-10-CM | POA: Diagnosis not present

## 2011-09-17 DIAGNOSIS — IMO0001 Reserved for inherently not codable concepts without codable children: Secondary | ICD-10-CM | POA: Diagnosis not present

## 2011-09-17 DIAGNOSIS — M25619 Stiffness of unspecified shoulder, not elsewhere classified: Secondary | ICD-10-CM | POA: Diagnosis not present

## 2011-09-20 ENCOUNTER — Ambulatory Visit: Payer: Medicare Other | Admitting: Physical Therapy

## 2011-09-20 DIAGNOSIS — M25619 Stiffness of unspecified shoulder, not elsewhere classified: Secondary | ICD-10-CM | POA: Diagnosis not present

## 2011-09-20 DIAGNOSIS — IMO0001 Reserved for inherently not codable concepts without codable children: Secondary | ICD-10-CM | POA: Diagnosis not present

## 2011-09-20 DIAGNOSIS — M25519 Pain in unspecified shoulder: Secondary | ICD-10-CM | POA: Diagnosis not present

## 2011-09-24 ENCOUNTER — Ambulatory Visit: Payer: Medicare Other | Admitting: Physical Therapy

## 2011-09-24 DIAGNOSIS — M25519 Pain in unspecified shoulder: Secondary | ICD-10-CM | POA: Diagnosis not present

## 2011-09-24 DIAGNOSIS — M25619 Stiffness of unspecified shoulder, not elsewhere classified: Secondary | ICD-10-CM | POA: Diagnosis not present

## 2011-09-24 DIAGNOSIS — IMO0001 Reserved for inherently not codable concepts without codable children: Secondary | ICD-10-CM | POA: Diagnosis not present

## 2011-09-27 ENCOUNTER — Ambulatory Visit: Payer: Medicare Other | Attending: Family Medicine | Admitting: Physical Therapy

## 2011-09-27 DIAGNOSIS — IMO0001 Reserved for inherently not codable concepts without codable children: Secondary | ICD-10-CM | POA: Insufficient documentation

## 2011-09-27 DIAGNOSIS — M25619 Stiffness of unspecified shoulder, not elsewhere classified: Secondary | ICD-10-CM | POA: Insufficient documentation

## 2011-09-27 DIAGNOSIS — M25519 Pain in unspecified shoulder: Secondary | ICD-10-CM | POA: Insufficient documentation

## 2011-10-02 ENCOUNTER — Ambulatory Visit: Payer: Medicare Other | Admitting: Physical Therapy

## 2011-10-03 ENCOUNTER — Ambulatory Visit: Payer: Medicare Other | Admitting: Physical Therapy

## 2011-10-10 ENCOUNTER — Ambulatory Visit: Payer: Medicare Other | Admitting: Physical Therapy

## 2011-10-11 ENCOUNTER — Ambulatory Visit: Payer: Medicare Other | Admitting: Physical Therapy

## 2011-10-16 ENCOUNTER — Ambulatory Visit: Payer: Medicare Other | Admitting: Physical Therapy

## 2011-10-18 ENCOUNTER — Ambulatory Visit: Payer: Medicare Other | Admitting: Physical Therapy

## 2011-10-22 ENCOUNTER — Ambulatory Visit: Payer: Medicare Other | Admitting: Physical Therapy

## 2011-10-24 ENCOUNTER — Ambulatory Visit: Payer: Medicare Other | Admitting: Physical Therapy

## 2011-11-02 DIAGNOSIS — J309 Allergic rhinitis, unspecified: Secondary | ICD-10-CM | POA: Diagnosis not present

## 2011-11-02 DIAGNOSIS — H6980 Other specified disorders of Eustachian tube, unspecified ear: Secondary | ICD-10-CM | POA: Diagnosis not present

## 2011-12-20 ENCOUNTER — Other Ambulatory Visit: Payer: Self-pay | Admitting: Dermatology

## 2011-12-20 DIAGNOSIS — D045 Carcinoma in situ of skin of trunk: Secondary | ICD-10-CM | POA: Diagnosis not present

## 2012-01-10 DIAGNOSIS — M542 Cervicalgia: Secondary | ICD-10-CM | POA: Diagnosis not present

## 2012-01-29 ENCOUNTER — Other Ambulatory Visit: Payer: Self-pay | Admitting: Family Medicine

## 2012-01-29 DIAGNOSIS — M542 Cervicalgia: Secondary | ICD-10-CM

## 2012-02-03 ENCOUNTER — Other Ambulatory Visit: Payer: Medicare Other

## 2012-02-07 ENCOUNTER — Ambulatory Visit
Admission: RE | Admit: 2012-02-07 | Discharge: 2012-02-07 | Disposition: A | Discharge status: Home / Self Care | Payer: Medicare Other | Source: Ambulatory Visit | Attending: Family Medicine | Admitting: Family Medicine

## 2012-02-07 DIAGNOSIS — M5020 Other cervical disc displacement, unspecified cervical region: Secondary | ICD-10-CM | POA: Diagnosis not present

## 2012-02-07 DIAGNOSIS — M47812 Spondylosis without myelopathy or radiculopathy, cervical region: Secondary | ICD-10-CM | POA: Diagnosis not present

## 2012-02-07 DIAGNOSIS — M542 Cervicalgia: Secondary | ICD-10-CM

## 2012-03-03 DIAGNOSIS — E7800 Pure hypercholesterolemia, unspecified: Secondary | ICD-10-CM | POA: Diagnosis not present

## 2012-03-03 DIAGNOSIS — I10 Essential (primary) hypertension: Secondary | ICD-10-CM | POA: Diagnosis not present

## 2012-03-03 DIAGNOSIS — E039 Hypothyroidism, unspecified: Secondary | ICD-10-CM | POA: Diagnosis not present

## 2012-03-03 DIAGNOSIS — E669 Obesity, unspecified: Secondary | ICD-10-CM | POA: Diagnosis not present

## 2012-03-03 DIAGNOSIS — E119 Type 2 diabetes mellitus without complications: Secondary | ICD-10-CM | POA: Diagnosis not present

## 2012-03-05 DIAGNOSIS — G894 Chronic pain syndrome: Secondary | ICD-10-CM | POA: Diagnosis not present

## 2012-03-05 DIAGNOSIS — M5137 Other intervertebral disc degeneration, lumbosacral region: Secondary | ICD-10-CM | POA: Diagnosis not present

## 2012-03-10 DIAGNOSIS — M5030 Other cervical disc degeneration, unspecified cervical region: Secondary | ICD-10-CM | POA: Diagnosis not present

## 2012-03-12 DIAGNOSIS — M5030 Other cervical disc degeneration, unspecified cervical region: Secondary | ICD-10-CM | POA: Diagnosis not present

## 2012-03-17 DIAGNOSIS — M5030 Other cervical disc degeneration, unspecified cervical region: Secondary | ICD-10-CM | POA: Diagnosis not present

## 2012-03-19 DIAGNOSIS — M5030 Other cervical disc degeneration, unspecified cervical region: Secondary | ICD-10-CM | POA: Diagnosis not present

## 2012-04-02 DIAGNOSIS — M5030 Other cervical disc degeneration, unspecified cervical region: Secondary | ICD-10-CM | POA: Diagnosis not present

## 2012-06-25 DIAGNOSIS — M76899 Other specified enthesopathies of unspecified lower limb, excluding foot: Secondary | ICD-10-CM | POA: Diagnosis not present

## 2012-06-25 DIAGNOSIS — M7650 Patellar tendinitis, unspecified knee: Secondary | ICD-10-CM | POA: Diagnosis not present

## 2012-06-26 DIAGNOSIS — M47812 Spondylosis without myelopathy or radiculopathy, cervical region: Secondary | ICD-10-CM | POA: Diagnosis not present

## 2012-06-26 DIAGNOSIS — M531 Cervicobrachial syndrome: Secondary | ICD-10-CM | POA: Diagnosis not present

## 2012-06-26 DIAGNOSIS — G4489 Other headache syndrome: Secondary | ICD-10-CM | POA: Diagnosis not present

## 2012-06-26 DIAGNOSIS — M542 Cervicalgia: Secondary | ICD-10-CM | POA: Diagnosis not present

## 2012-07-28 DIAGNOSIS — M531 Cervicobrachial syndrome: Secondary | ICD-10-CM | POA: Diagnosis not present

## 2012-07-28 DIAGNOSIS — G4489 Other headache syndrome: Secondary | ICD-10-CM | POA: Diagnosis not present

## 2012-07-31 DIAGNOSIS — IMO0002 Reserved for concepts with insufficient information to code with codable children: Secondary | ICD-10-CM | POA: Diagnosis not present

## 2012-07-31 DIAGNOSIS — M1710 Unilateral primary osteoarthritis, unspecified knee: Secondary | ICD-10-CM | POA: Diagnosis not present

## 2012-09-01 DIAGNOSIS — E119 Type 2 diabetes mellitus without complications: Secondary | ICD-10-CM | POA: Diagnosis not present

## 2012-09-01 DIAGNOSIS — E11329 Type 2 diabetes mellitus with mild nonproliferative diabetic retinopathy without macular edema: Secondary | ICD-10-CM | POA: Diagnosis not present

## 2012-09-01 DIAGNOSIS — E1139 Type 2 diabetes mellitus with other diabetic ophthalmic complication: Secondary | ICD-10-CM | POA: Diagnosis not present

## 2012-09-01 DIAGNOSIS — H52229 Regular astigmatism, unspecified eye: Secondary | ICD-10-CM | POA: Diagnosis not present

## 2012-09-01 DIAGNOSIS — E039 Hypothyroidism, unspecified: Secondary | ICD-10-CM | POA: Diagnosis not present

## 2012-09-01 DIAGNOSIS — H00029 Hordeolum internum unspecified eye, unspecified eyelid: Secondary | ICD-10-CM | POA: Diagnosis not present

## 2012-09-01 DIAGNOSIS — I10 Essential (primary) hypertension: Secondary | ICD-10-CM | POA: Diagnosis not present

## 2012-09-01 DIAGNOSIS — E669 Obesity, unspecified: Secondary | ICD-10-CM | POA: Diagnosis not present

## 2012-09-01 DIAGNOSIS — K219 Gastro-esophageal reflux disease without esophagitis: Secondary | ICD-10-CM | POA: Diagnosis not present

## 2012-09-01 DIAGNOSIS — E7800 Pure hypercholesterolemia, unspecified: Secondary | ICD-10-CM | POA: Diagnosis not present

## 2012-09-04 DIAGNOSIS — Z1231 Encounter for screening mammogram for malignant neoplasm of breast: Secondary | ICD-10-CM | POA: Diagnosis not present

## 2012-09-04 DIAGNOSIS — Z124 Encounter for screening for malignant neoplasm of cervix: Secondary | ICD-10-CM | POA: Diagnosis not present

## 2012-09-04 DIAGNOSIS — Z130 Encounter for screening for diseases of the blood and blood-forming organs and certain disorders involving the immune mechanism: Secondary | ICD-10-CM | POA: Diagnosis not present

## 2012-09-10 DIAGNOSIS — E119 Type 2 diabetes mellitus without complications: Secondary | ICD-10-CM | POA: Diagnosis not present

## 2013-03-16 DIAGNOSIS — E1139 Type 2 diabetes mellitus with other diabetic ophthalmic complication: Secondary | ICD-10-CM | POA: Diagnosis not present

## 2013-03-16 DIAGNOSIS — E7800 Pure hypercholesterolemia, unspecified: Secondary | ICD-10-CM | POA: Diagnosis not present

## 2013-03-16 DIAGNOSIS — E669 Obesity, unspecified: Secondary | ICD-10-CM | POA: Diagnosis not present

## 2013-03-16 DIAGNOSIS — I10 Essential (primary) hypertension: Secondary | ICD-10-CM | POA: Diagnosis not present

## 2013-03-16 DIAGNOSIS — Z23 Encounter for immunization: Secondary | ICD-10-CM | POA: Diagnosis not present

## 2013-03-16 DIAGNOSIS — E039 Hypothyroidism, unspecified: Secondary | ICD-10-CM | POA: Diagnosis not present

## 2013-04-02 ENCOUNTER — Other Ambulatory Visit (HOSPITAL_COMMUNITY): Payer: Self-pay | Admitting: Family Medicine

## 2013-04-02 ENCOUNTER — Ambulatory Visit (HOSPITAL_COMMUNITY)
Admission: RE | Admit: 2013-04-02 | Discharge: 2013-04-02 | Disposition: A | Discharge status: Home / Self Care | Payer: Medicare Other | Source: Ambulatory Visit | Attending: Family Medicine | Admitting: Family Medicine

## 2013-04-02 DIAGNOSIS — M25569 Pain in unspecified knee: Secondary | ICD-10-CM

## 2013-04-02 DIAGNOSIS — M79609 Pain in unspecified limb: Secondary | ICD-10-CM | POA: Diagnosis not present

## 2013-04-02 DIAGNOSIS — M7989 Other specified soft tissue disorders: Secondary | ICD-10-CM

## 2013-04-03 DIAGNOSIS — M19079 Primary osteoarthritis, unspecified ankle and foot: Secondary | ICD-10-CM | POA: Diagnosis not present

## 2013-04-03 DIAGNOSIS — M1710 Unilateral primary osteoarthritis, unspecified knee: Secondary | ICD-10-CM | POA: Diagnosis not present

## 2013-04-03 DIAGNOSIS — M5137 Other intervertebral disc degeneration, lumbosacral region: Secondary | ICD-10-CM | POA: Diagnosis not present

## 2013-04-03 DIAGNOSIS — IMO0002 Reserved for concepts with insufficient information to code with codable children: Secondary | ICD-10-CM | POA: Diagnosis not present

## 2013-04-17 DIAGNOSIS — M1710 Unilateral primary osteoarthritis, unspecified knee: Secondary | ICD-10-CM | POA: Diagnosis not present

## 2013-04-17 DIAGNOSIS — IMO0002 Reserved for concepts with insufficient information to code with codable children: Secondary | ICD-10-CM | POA: Diagnosis not present

## 2013-04-17 DIAGNOSIS — Z96659 Presence of unspecified artificial knee joint: Secondary | ICD-10-CM | POA: Diagnosis not present

## 2013-09-01 DIAGNOSIS — H524 Presbyopia: Secondary | ICD-10-CM | POA: Diagnosis not present

## 2013-09-01 DIAGNOSIS — H2510 Age-related nuclear cataract, unspecified eye: Secondary | ICD-10-CM | POA: Diagnosis not present

## 2013-09-01 DIAGNOSIS — H5210 Myopia, unspecified eye: Secondary | ICD-10-CM | POA: Diagnosis not present

## 2013-09-01 DIAGNOSIS — E119 Type 2 diabetes mellitus without complications: Secondary | ICD-10-CM | POA: Diagnosis not present

## 2013-09-01 DIAGNOSIS — H52229 Regular astigmatism, unspecified eye: Secondary | ICD-10-CM | POA: Diagnosis not present

## 2013-09-14 DIAGNOSIS — K219 Gastro-esophageal reflux disease without esophagitis: Secondary | ICD-10-CM | POA: Diagnosis not present

## 2013-09-14 DIAGNOSIS — E039 Hypothyroidism, unspecified: Secondary | ICD-10-CM | POA: Diagnosis not present

## 2013-09-14 DIAGNOSIS — E669 Obesity, unspecified: Secondary | ICD-10-CM | POA: Diagnosis not present

## 2013-09-14 DIAGNOSIS — E119 Type 2 diabetes mellitus without complications: Secondary | ICD-10-CM | POA: Diagnosis not present

## 2013-09-14 DIAGNOSIS — I10 Essential (primary) hypertension: Secondary | ICD-10-CM | POA: Diagnosis not present

## 2013-09-14 DIAGNOSIS — Z79899 Other long term (current) drug therapy: Secondary | ICD-10-CM | POA: Diagnosis not present

## 2013-09-14 DIAGNOSIS — E7800 Pure hypercholesterolemia, unspecified: Secondary | ICD-10-CM | POA: Diagnosis not present

## 2013-09-21 DIAGNOSIS — M949 Disorder of cartilage, unspecified: Secondary | ICD-10-CM | POA: Diagnosis not present

## 2013-09-21 DIAGNOSIS — Z1231 Encounter for screening mammogram for malignant neoplasm of breast: Secondary | ICD-10-CM | POA: Diagnosis not present

## 2013-09-21 DIAGNOSIS — M899 Disorder of bone, unspecified: Secondary | ICD-10-CM | POA: Diagnosis not present

## 2013-09-21 DIAGNOSIS — R350 Frequency of micturition: Secondary | ICD-10-CM | POA: Diagnosis not present

## 2013-09-21 DIAGNOSIS — Z01419 Encounter for gynecological examination (general) (routine) without abnormal findings: Secondary | ICD-10-CM | POA: Diagnosis not present

## 2013-09-21 DIAGNOSIS — Z130 Encounter for screening for diseases of the blood and blood-forming organs and certain disorders involving the immune mechanism: Secondary | ICD-10-CM | POA: Diagnosis not present

## 2013-09-21 DIAGNOSIS — N959 Unspecified menopausal and perimenopausal disorder: Secondary | ICD-10-CM | POA: Diagnosis not present

## 2013-09-23 ENCOUNTER — Other Ambulatory Visit: Payer: Self-pay | Admitting: Obstetrics & Gynecology

## 2013-09-23 DIAGNOSIS — R928 Other abnormal and inconclusive findings on diagnostic imaging of breast: Secondary | ICD-10-CM

## 2013-09-30 ENCOUNTER — Ambulatory Visit
Admission: RE | Admit: 2013-09-30 | Discharge: 2013-09-30 | Disposition: A | Discharge status: Home / Self Care | Payer: Medicare Other | Source: Ambulatory Visit | Attending: Obstetrics & Gynecology | Admitting: Obstetrics & Gynecology

## 2013-09-30 ENCOUNTER — Encounter (INDEPENDENT_AMBULATORY_CARE_PROVIDER_SITE_OTHER): Payer: Self-pay

## 2013-09-30 DIAGNOSIS — R928 Other abnormal and inconclusive findings on diagnostic imaging of breast: Secondary | ICD-10-CM

## 2014-03-10 ENCOUNTER — Other Ambulatory Visit: Payer: Self-pay | Admitting: Obstetrics & Gynecology

## 2014-03-10 DIAGNOSIS — Z803 Family history of malignant neoplasm of breast: Secondary | ICD-10-CM

## 2014-03-10 DIAGNOSIS — R921 Mammographic calcification found on diagnostic imaging of breast: Secondary | ICD-10-CM

## 2014-03-16 DIAGNOSIS — Z23 Encounter for immunization: Secondary | ICD-10-CM | POA: Diagnosis not present

## 2014-03-16 DIAGNOSIS — I499 Cardiac arrhythmia, unspecified: Secondary | ICD-10-CM | POA: Diagnosis not present

## 2014-03-16 DIAGNOSIS — E039 Hypothyroidism, unspecified: Secondary | ICD-10-CM | POA: Diagnosis not present

## 2014-03-16 DIAGNOSIS — K219 Gastro-esophageal reflux disease without esophagitis: Secondary | ICD-10-CM | POA: Diagnosis not present

## 2014-03-16 DIAGNOSIS — Z79899 Other long term (current) drug therapy: Secondary | ICD-10-CM | POA: Diagnosis not present

## 2014-03-16 DIAGNOSIS — E780 Pure hypercholesterolemia: Secondary | ICD-10-CM | POA: Diagnosis not present

## 2014-03-16 DIAGNOSIS — Z6836 Body mass index (BMI) 36.0-36.9, adult: Secondary | ICD-10-CM | POA: Diagnosis not present

## 2014-03-16 DIAGNOSIS — E119 Type 2 diabetes mellitus without complications: Secondary | ICD-10-CM | POA: Diagnosis not present

## 2014-03-16 DIAGNOSIS — E6609 Other obesity due to excess calories: Secondary | ICD-10-CM | POA: Diagnosis not present

## 2014-03-16 DIAGNOSIS — I10 Essential (primary) hypertension: Secondary | ICD-10-CM | POA: Diagnosis not present

## 2014-04-07 DIAGNOSIS — Z79899 Other long term (current) drug therapy: Secondary | ICD-10-CM | POA: Diagnosis not present

## 2014-04-08 ENCOUNTER — Ambulatory Visit
Admission: RE | Admit: 2014-04-08 | Discharge: 2014-04-08 | Disposition: A | Discharge status: Home / Self Care | Payer: Medicare Other | Source: Ambulatory Visit | Attending: Obstetrics & Gynecology | Admitting: Obstetrics & Gynecology

## 2014-04-08 DIAGNOSIS — Z803 Family history of malignant neoplasm of breast: Secondary | ICD-10-CM

## 2014-04-08 DIAGNOSIS — R921 Mammographic calcification found on diagnostic imaging of breast: Secondary | ICD-10-CM

## 2014-04-08 DIAGNOSIS — D241 Benign neoplasm of right breast: Secondary | ICD-10-CM | POA: Diagnosis not present

## 2014-05-07 DIAGNOSIS — L821 Other seborrheic keratosis: Secondary | ICD-10-CM | POA: Diagnosis not present

## 2014-05-07 DIAGNOSIS — Z85828 Personal history of other malignant neoplasm of skin: Secondary | ICD-10-CM | POA: Diagnosis not present

## 2014-09-09 ENCOUNTER — Other Ambulatory Visit: Payer: Self-pay | Admitting: Obstetrics & Gynecology

## 2014-09-09 DIAGNOSIS — R921 Mammographic calcification found on diagnostic imaging of breast: Secondary | ICD-10-CM

## 2014-09-23 DIAGNOSIS — H52223 Regular astigmatism, bilateral: Secondary | ICD-10-CM | POA: Diagnosis not present

## 2014-09-23 DIAGNOSIS — E119 Type 2 diabetes mellitus without complications: Secondary | ICD-10-CM | POA: Diagnosis not present

## 2014-09-23 DIAGNOSIS — H524 Presbyopia: Secondary | ICD-10-CM | POA: Diagnosis not present

## 2014-09-23 DIAGNOSIS — H2513 Age-related nuclear cataract, bilateral: Secondary | ICD-10-CM | POA: Diagnosis not present

## 2014-09-23 DIAGNOSIS — H5213 Myopia, bilateral: Secondary | ICD-10-CM | POA: Diagnosis not present

## 2014-10-06 DIAGNOSIS — K219 Gastro-esophageal reflux disease without esophagitis: Secondary | ICD-10-CM | POA: Diagnosis not present

## 2014-10-06 DIAGNOSIS — M79671 Pain in right foot: Secondary | ICD-10-CM | POA: Diagnosis not present

## 2014-10-06 DIAGNOSIS — I10 Essential (primary) hypertension: Secondary | ICD-10-CM | POA: Diagnosis not present

## 2014-10-06 DIAGNOSIS — E780 Pure hypercholesterolemia: Secondary | ICD-10-CM | POA: Diagnosis not present

## 2014-10-06 DIAGNOSIS — E039 Hypothyroidism, unspecified: Secondary | ICD-10-CM | POA: Diagnosis not present

## 2014-10-06 DIAGNOSIS — E119 Type 2 diabetes mellitus without complications: Secondary | ICD-10-CM | POA: Diagnosis not present

## 2014-10-11 ENCOUNTER — Other Ambulatory Visit: Payer: Self-pay | Admitting: Obstetrics & Gynecology

## 2014-10-11 DIAGNOSIS — Z01419 Encounter for gynecological examination (general) (routine) without abnormal findings: Secondary | ICD-10-CM | POA: Diagnosis not present

## 2014-10-11 DIAGNOSIS — Z124 Encounter for screening for malignant neoplasm of cervix: Secondary | ICD-10-CM | POA: Diagnosis not present

## 2014-10-11 DIAGNOSIS — Z6835 Body mass index (BMI) 35.0-35.9, adult: Secondary | ICD-10-CM | POA: Diagnosis not present

## 2014-10-11 DIAGNOSIS — N760 Acute vaginitis: Secondary | ICD-10-CM | POA: Diagnosis not present

## 2014-10-11 DIAGNOSIS — Z1272 Encounter for screening for malignant neoplasm of vagina: Secondary | ICD-10-CM | POA: Diagnosis not present

## 2014-10-12 LAB — CYTOLOGY - PAP

## 2014-10-13 ENCOUNTER — Encounter (INDEPENDENT_AMBULATORY_CARE_PROVIDER_SITE_OTHER): Payer: Medicare Other | Admitting: Ophthalmology

## 2014-10-13 DIAGNOSIS — H2513 Age-related nuclear cataract, bilateral: Secondary | ICD-10-CM | POA: Diagnosis not present

## 2014-10-13 DIAGNOSIS — H43813 Vitreous degeneration, bilateral: Secondary | ICD-10-CM | POA: Diagnosis not present

## 2014-10-13 DIAGNOSIS — I10 Essential (primary) hypertension: Secondary | ICD-10-CM | POA: Diagnosis not present

## 2014-10-13 DIAGNOSIS — H35033 Hypertensive retinopathy, bilateral: Secondary | ICD-10-CM

## 2014-10-13 DIAGNOSIS — E11329 Type 2 diabetes mellitus with mild nonproliferative diabetic retinopathy without macular edema: Secondary | ICD-10-CM | POA: Diagnosis not present

## 2014-10-13 DIAGNOSIS — D3132 Benign neoplasm of left choroid: Secondary | ICD-10-CM

## 2014-10-13 DIAGNOSIS — E11319 Type 2 diabetes mellitus with unspecified diabetic retinopathy without macular edema: Secondary | ICD-10-CM

## 2014-10-14 ENCOUNTER — Ambulatory Visit
Admission: RE | Admit: 2014-10-14 | Discharge: 2014-10-14 | Disposition: A | Discharge status: Home / Self Care | Payer: Medicare Other | Source: Ambulatory Visit | Attending: Obstetrics & Gynecology | Admitting: Obstetrics & Gynecology

## 2014-10-14 DIAGNOSIS — R921 Mammographic calcification found on diagnostic imaging of breast: Secondary | ICD-10-CM

## 2014-10-21 DIAGNOSIS — M2011 Hallux valgus (acquired), right foot: Secondary | ICD-10-CM | POA: Diagnosis not present

## 2014-10-21 DIAGNOSIS — M19071 Primary osteoarthritis, right ankle and foot: Secondary | ICD-10-CM | POA: Diagnosis not present

## 2015-03-28 DIAGNOSIS — K219 Gastro-esophageal reflux disease without esophagitis: Secondary | ICD-10-CM | POA: Diagnosis not present

## 2015-03-28 DIAGNOSIS — R110 Nausea: Secondary | ICD-10-CM | POA: Diagnosis not present

## 2015-03-28 DIAGNOSIS — M546 Pain in thoracic spine: Secondary | ICD-10-CM | POA: Diagnosis not present

## 2015-03-28 DIAGNOSIS — I499 Cardiac arrhythmia, unspecified: Secondary | ICD-10-CM | POA: Diagnosis not present

## 2015-03-31 ENCOUNTER — Encounter: Payer: Self-pay | Admitting: Gastroenterology

## 2015-05-24 ENCOUNTER — Ambulatory Visit: Payer: Medicare Other | Admitting: Gastroenterology

## 2015-06-29 DIAGNOSIS — M1711 Unilateral primary osteoarthritis, right knee: Secondary | ICD-10-CM | POA: Diagnosis not present

## 2015-06-29 DIAGNOSIS — Z471 Aftercare following joint replacement surgery: Secondary | ICD-10-CM | POA: Diagnosis not present

## 2015-06-29 DIAGNOSIS — Z96652 Presence of left artificial knee joint: Secondary | ICD-10-CM | POA: Diagnosis not present

## 2015-07-07 DIAGNOSIS — M1712 Unilateral primary osteoarthritis, left knee: Secondary | ICD-10-CM | POA: Diagnosis not present

## 2015-07-12 DIAGNOSIS — M1712 Unilateral primary osteoarthritis, left knee: Secondary | ICD-10-CM | POA: Diagnosis not present

## 2015-07-15 DIAGNOSIS — M1712 Unilateral primary osteoarthritis, left knee: Secondary | ICD-10-CM | POA: Diagnosis not present

## 2015-07-19 DIAGNOSIS — M1712 Unilateral primary osteoarthritis, left knee: Secondary | ICD-10-CM | POA: Diagnosis not present

## 2015-07-20 DIAGNOSIS — K219 Gastro-esophageal reflux disease without esophagitis: Secondary | ICD-10-CM | POA: Diagnosis not present

## 2015-07-20 DIAGNOSIS — E7800 Pure hypercholesterolemia, unspecified: Secondary | ICD-10-CM | POA: Diagnosis not present

## 2015-07-20 DIAGNOSIS — M4722 Other spondylosis with radiculopathy, cervical region: Secondary | ICD-10-CM | POA: Diagnosis not present

## 2015-07-20 DIAGNOSIS — I10 Essential (primary) hypertension: Secondary | ICD-10-CM | POA: Diagnosis not present

## 2015-07-20 DIAGNOSIS — E039 Hypothyroidism, unspecified: Secondary | ICD-10-CM | POA: Diagnosis not present

## 2015-07-20 DIAGNOSIS — Z6837 Body mass index (BMI) 37.0-37.9, adult: Secondary | ICD-10-CM | POA: Diagnosis not present

## 2015-07-20 DIAGNOSIS — Z86010 Personal history of colonic polyps: Secondary | ICD-10-CM | POA: Diagnosis not present

## 2015-07-20 DIAGNOSIS — E6609 Other obesity due to excess calories: Secondary | ICD-10-CM | POA: Diagnosis not present

## 2015-07-20 DIAGNOSIS — Z7984 Long term (current) use of oral hypoglycemic drugs: Secondary | ICD-10-CM | POA: Diagnosis not present

## 2015-07-20 DIAGNOSIS — Z0000 Encounter for general adult medical examination without abnormal findings: Secondary | ICD-10-CM | POA: Diagnosis not present

## 2015-07-20 DIAGNOSIS — E119 Type 2 diabetes mellitus without complications: Secondary | ICD-10-CM | POA: Diagnosis not present

## 2015-07-22 DIAGNOSIS — M1712 Unilateral primary osteoarthritis, left knee: Secondary | ICD-10-CM | POA: Diagnosis not present

## 2015-07-26 DIAGNOSIS — M1712 Unilateral primary osteoarthritis, left knee: Secondary | ICD-10-CM | POA: Diagnosis not present

## 2015-07-29 DIAGNOSIS — M1712 Unilateral primary osteoarthritis, left knee: Secondary | ICD-10-CM | POA: Diagnosis not present

## 2015-07-31 DIAGNOSIS — H6980 Other specified disorders of Eustachian tube, unspecified ear: Secondary | ICD-10-CM | POA: Diagnosis not present

## 2015-07-31 DIAGNOSIS — B349 Viral infection, unspecified: Secondary | ICD-10-CM | POA: Diagnosis not present

## 2015-08-02 DIAGNOSIS — M1712 Unilateral primary osteoarthritis, left knee: Secondary | ICD-10-CM | POA: Diagnosis not present

## 2015-08-04 DIAGNOSIS — J0100 Acute maxillary sinusitis, unspecified: Secondary | ICD-10-CM | POA: Diagnosis not present

## 2015-08-05 DIAGNOSIS — M1712 Unilateral primary osteoarthritis, left knee: Secondary | ICD-10-CM | POA: Diagnosis not present

## 2015-08-09 DIAGNOSIS — M1712 Unilateral primary osteoarthritis, left knee: Secondary | ICD-10-CM | POA: Diagnosis not present

## 2015-08-12 DIAGNOSIS — M1712 Unilateral primary osteoarthritis, left knee: Secondary | ICD-10-CM | POA: Diagnosis not present

## 2015-08-16 DIAGNOSIS — M1712 Unilateral primary osteoarthritis, left knee: Secondary | ICD-10-CM | POA: Diagnosis not present

## 2015-08-19 DIAGNOSIS — Z471 Aftercare following joint replacement surgery: Secondary | ICD-10-CM | POA: Diagnosis not present

## 2015-08-19 DIAGNOSIS — Z96652 Presence of left artificial knee joint: Secondary | ICD-10-CM | POA: Diagnosis not present

## 2015-08-19 DIAGNOSIS — M1711 Unilateral primary osteoarthritis, right knee: Secondary | ICD-10-CM | POA: Diagnosis not present

## 2015-09-12 DIAGNOSIS — E119 Type 2 diabetes mellitus without complications: Secondary | ICD-10-CM | POA: Diagnosis not present

## 2015-09-20 ENCOUNTER — Other Ambulatory Visit: Payer: Self-pay | Admitting: Obstetrics & Gynecology

## 2015-09-20 DIAGNOSIS — R921 Mammographic calcification found on diagnostic imaging of breast: Secondary | ICD-10-CM

## 2015-10-17 DIAGNOSIS — Z6836 Body mass index (BMI) 36.0-36.9, adult: Secondary | ICD-10-CM | POA: Diagnosis not present

## 2015-10-17 DIAGNOSIS — Z01419 Encounter for gynecological examination (general) (routine) without abnormal findings: Secondary | ICD-10-CM | POA: Diagnosis not present

## 2015-10-17 DIAGNOSIS — R102 Pelvic and perineal pain: Secondary | ICD-10-CM | POA: Diagnosis not present

## 2015-10-20 ENCOUNTER — Ambulatory Visit
Admission: RE | Admit: 2015-10-20 | Discharge: 2015-10-20 | Disposition: A | Discharge status: Home / Self Care | Payer: Medicare Other | Source: Ambulatory Visit | Attending: Obstetrics & Gynecology | Admitting: Obstetrics & Gynecology

## 2015-10-20 DIAGNOSIS — R921 Mammographic calcification found on diagnostic imaging of breast: Secondary | ICD-10-CM

## 2015-11-22 DIAGNOSIS — Z86010 Personal history of colonic polyps: Secondary | ICD-10-CM | POA: Diagnosis not present

## 2015-11-22 DIAGNOSIS — Z1211 Encounter for screening for malignant neoplasm of colon: Secondary | ICD-10-CM | POA: Diagnosis not present

## 2015-11-22 DIAGNOSIS — K5730 Diverticulosis of large intestine without perforation or abscess without bleeding: Secondary | ICD-10-CM | POA: Diagnosis not present

## 2015-11-22 DIAGNOSIS — K219 Gastro-esophageal reflux disease without esophagitis: Secondary | ICD-10-CM | POA: Diagnosis not present

## 2015-12-09 DIAGNOSIS — Z1211 Encounter for screening for malignant neoplasm of colon: Secondary | ICD-10-CM | POA: Diagnosis not present

## 2015-12-09 DIAGNOSIS — Z800 Family history of malignant neoplasm of digestive organs: Secondary | ICD-10-CM | POA: Diagnosis not present

## 2015-12-09 DIAGNOSIS — K635 Polyp of colon: Secondary | ICD-10-CM | POA: Diagnosis not present

## 2015-12-09 DIAGNOSIS — D122 Benign neoplasm of ascending colon: Secondary | ICD-10-CM | POA: Diagnosis not present

## 2015-12-09 DIAGNOSIS — Z86010 Personal history of colonic polyps: Secondary | ICD-10-CM | POA: Diagnosis not present

## 2015-12-13 ENCOUNTER — Encounter: Payer: Self-pay | Admitting: Gastroenterology

## 2016-03-11 ENCOUNTER — Encounter (HOSPITAL_COMMUNITY): Payer: Self-pay | Admitting: Emergency Medicine

## 2016-03-11 ENCOUNTER — Emergency Department (HOSPITAL_COMMUNITY)
Admission: EM | Admit: 2016-03-11 | Discharge: 2016-03-11 | Disposition: A | Discharge status: Home / Self Care | Payer: Medicare Other | Attending: Emergency Medicine | Admitting: Emergency Medicine

## 2016-03-11 DIAGNOSIS — Y929 Unspecified place or not applicable: Secondary | ICD-10-CM | POA: Diagnosis not present

## 2016-03-11 DIAGNOSIS — Z7984 Long term (current) use of oral hypoglycemic drugs: Secondary | ICD-10-CM | POA: Insufficient documentation

## 2016-03-11 DIAGNOSIS — S299XXA Unspecified injury of thorax, initial encounter: Secondary | ICD-10-CM | POA: Diagnosis present

## 2016-03-11 DIAGNOSIS — Z79899 Other long term (current) drug therapy: Secondary | ICD-10-CM | POA: Diagnosis not present

## 2016-03-11 DIAGNOSIS — E119 Type 2 diabetes mellitus without complications: Secondary | ICD-10-CM | POA: Diagnosis not present

## 2016-03-11 DIAGNOSIS — Z96652 Presence of left artificial knee joint: Secondary | ICD-10-CM | POA: Diagnosis not present

## 2016-03-11 DIAGNOSIS — Z7982 Long term (current) use of aspirin: Secondary | ICD-10-CM | POA: Insufficient documentation

## 2016-03-11 DIAGNOSIS — S29011A Strain of muscle and tendon of front wall of thorax, initial encounter: Secondary | ICD-10-CM | POA: Diagnosis not present

## 2016-03-11 DIAGNOSIS — I10 Essential (primary) hypertension: Secondary | ICD-10-CM | POA: Insufficient documentation

## 2016-03-11 DIAGNOSIS — X500XXA Overexertion from strenuous movement or load, initial encounter: Secondary | ICD-10-CM | POA: Insufficient documentation

## 2016-03-11 DIAGNOSIS — Y939 Activity, unspecified: Secondary | ICD-10-CM | POA: Insufficient documentation

## 2016-03-11 DIAGNOSIS — Y990 Civilian activity done for income or pay: Secondary | ICD-10-CM | POA: Diagnosis not present

## 2016-03-11 DIAGNOSIS — T148XXA Other injury of unspecified body region, initial encounter: Secondary | ICD-10-CM

## 2016-03-11 LAB — COMPREHENSIVE METABOLIC PANEL
ALK PHOS: 71 U/L (ref 38–126)
ALT: 16 U/L (ref 14–54)
ANION GAP: 10 (ref 5–15)
AST: 22 U/L (ref 15–41)
Albumin: 5 g/dL (ref 3.5–5.0)
BILIRUBIN TOTAL: 0.6 mg/dL (ref 0.3–1.2)
BUN: 20 mg/dL (ref 6–20)
CALCIUM: 9.8 mg/dL (ref 8.9–10.3)
CO2: 28 mmol/L (ref 22–32)
Chloride: 102 mmol/L (ref 101–111)
Creatinine, Ser: 0.7 mg/dL (ref 0.44–1.00)
GFR calc Af Amer: >60 mL/min (ref >60)
Glucose, Bld: 136 mg/dL — ABNORMAL HIGH (ref 65–99)
POTASSIUM: 3.5 mmol/L (ref 3.5–5.1)
Sodium: 140 mmol/L (ref 135–145)
Total Protein: 7.9 g/dL (ref 6.5–8.1)

## 2016-03-11 LAB — CBC
HEMATOCRIT: 37.9 % (ref 36.0–46.0)
Hemoglobin: 12 g/dL (ref 12.0–15.0)
MCH: 29.2 pg (ref 26.0–34.0)
MCHC: 31.7 g/dL (ref 30.0–36.0)
MCV: 92.2 fL (ref 78.0–100.0)
Platelets: 260 10*3/uL (ref 150–400)
RBC: 4.11 MIL/uL (ref 3.87–5.11)
RDW: 14.2 % (ref 11.5–15.5)
WBC: 8 10*3/uL (ref 4.0–10.5)

## 2016-03-11 LAB — URINALYSIS, ROUTINE W REFLEX MICROSCOPIC
Bacteria, UA: NONE SEEN
Bilirubin Urine: NEGATIVE
GLUCOSE, UA: NEGATIVE mg/dL
HGB URINE DIPSTICK: NEGATIVE
Ketones, ur: NEGATIVE mg/dL
Nitrite: NEGATIVE
PH: 6 (ref 5.0–8.0)
PROTEIN: NEGATIVE mg/dL
SPECIFIC GRAVITY, URINE: 1.008 (ref 1.005–1.030)

## 2016-03-11 LAB — LIPASE, BLOOD: Lipase: 27 U/L (ref 11–51)

## 2016-03-11 NOTE — ED Provider Notes (Signed)
[facility] DEPT Provider Note   CSN: [id number] Arrival date & time: 03/11/16  1611     History   Chief Complaint Chief Complaint  Patient presents with  . Back Pain  . Abdominal Pain    HPI Michele Dawson is a 76 y.o. female.  She presents For evaluation of right posterior chest pain, which started about a week ago, when she bent over and picked up a heavy box of quarters, at work. The pain was initially, aching, and has been persistent. Today she suddenly stood up and had sharp pain in her back, which concerned her. She denies fever, chills, nausea, vomiting, cough, shortness of breath, weakness or dizziness. There are no other known modifying factors.  HPI  Past Medical History:  Diagnosis Date  . Arthritis   . Diabetes mellitus   . Diverticulosis   . Family history of malignant neoplasm of gastrointestinal tract   . GERD (gastroesophageal reflux disease)   . Hyperlipidemia   . Hypertension     Patient Active Problem List   Diagnosis Date Noted  . THYROID DISORDER 04/22/2007  . DM 04/22/2007  . HYPERLIPIDEMIA 04/22/2007  . UNSPECIFIED SINUSITIS 04/22/2007  . DIVERTICULOSIS, COLON 04/22/2007  . ARTHRITIS 04/22/2007  . ELEVATED BLOOD PRESSURE 04/22/2007    Past Surgical History:  Procedure Laterality Date  . CARPAL TUNNEL RELEASE     left  . NASAL SEPTUM SURGERY    . TOTAL KNEE ARTHROPLASTY     left  . VAGINAL HYSTERECTOMY     with anterior and posterior vaginal repair    OB History    No data available       Home Medications    Prior to Admission medications   Medication Sig Start Date End Date Taking? Authorizing Provider  amLODipine (NORVASC) 10 MG tablet Take 10 mg by mouth daily.      Historical Provider, MD  aspirin 81 MG tablet Take 81 mg by mouth daily.      Historical Provider, MD  atorvastatin (LIPITOR) 80 MG tablet Take 80 mg by mouth daily.      Historical Provider, MD  Calcium-Magnesium-Zinc (CAL-MAG-ZINC PO) Take by mouth. 2-3  daily     Historical Provider, MD  Cholecalciferol (VITAMIN D3) 1000 UNITS CAPS Take 1 capsule by mouth 2 (two) times daily.      Historical Provider, MD  dicyclomine (BENTYL) 10 MG capsule Take 1 capsule (10 mg total) by mouth 2 (two) times daily. Take one po TID 01/29/11   Lafayette Dragon, MD  levothyroxine (SYNTHROID, LEVOTHROID) 125 MCG tablet Take 125 mcg by mouth daily.      Historical Provider, MD  lisinopril (PRINIVIL,ZESTRIL) 20 MG tablet Take 20 mg by mouth daily.      Historical Provider, MD  metFORMIN (GLUMETZA) 500 MG (MOD) 24 hr tablet Take 2 tablets by mouth twice daily     Historical Provider, MD  pantoprazole (PROTONIX) 40 MG tablet Take 40 mg by mouth daily.      Historical Provider, MD  Probiotic Product (ALIGN) 4 MG CAPS Take 1 capsule by mouth 1 day or 1 dose. 01/29/11   Lafayette Dragon, MD  vitamin B-12 (CYANOCOBALAMIN) 1000 MCG tablet Take 1,000 mcg by mouth daily.      Historical Provider, MD    Family History Family History  Problem Relation Age of Onset  . Colon cancer Sister   . Breast cancer Mother   . Breast cancer      aunt x 2  .  Tongue cancer Father   . Heart disease Father     Social History Social History  Substance Use Topics  . Smoking status: Never Smoker  . Smokeless tobacco: Never Used  . Alcohol use Yes     Comment: socially     Allergies   Penicillins; Shellfish allergy; and Sulfa antibiotics   Review of Systems Review of Systems  All other systems reviewed and are negative.    Physical Exam Updated Vital Signs BP 139/89 (BP Location: Left Arm)   Pulse 88   Temp 98.2 F (36.8 C) (Oral)   Resp 16   Ht 5\' 7"  (1.702 m)   Wt 230 lb 9.6 oz (104.6 kg)   SpO2 94%   BMI 36.12 kg/m   Physical Exam  Constitutional: She is oriented to person, place, and time. She appears well-developed and well-nourished.  HENT:  Head: Normocephalic and atraumatic.  Right Ear: External ear normal.  Left Ear: External ear normal.  Eyes: Conjunctivae  and EOM are normal. Pupils are equal, round, and reactive to light.  Neck: Normal range of motion and phonation normal. Neck supple.  Cardiovascular: Normal rate, regular rhythm and normal heart sounds.   Pulmonary/Chest: Effort normal and breath sounds normal. She exhibits tenderness (Mild diffuse right posterior chest wall tenderness, without ecchymosis, crepitation or deformity.). She exhibits no bony tenderness.  Musculoskeletal: Normal range of motion.  Normal gait.  Neurological: She is alert and oriented to person, place, and time. No cranial nerve deficit or sensory deficit. She exhibits normal muscle tone. Coordination normal.  Skin: Skin is warm, dry and intact.  Psychiatric: She has a normal mood and affect. Her behavior is normal. Judgment and thought content normal.  Nursing note and vitals reviewed.    ED Treatments / Results  Labs (all labs ordered are listed, but only abnormal results are displayed) Labs Reviewed  COMPREHENSIVE METABOLIC PANEL - Abnormal; Notable for the following:       Result Value   Glucose, Bld 136 (*)    All other components within normal limits  URINALYSIS, ROUTINE W REFLEX MICROSCOPIC - Abnormal; Notable for the following:    Color, Urine STRAW (*)    Leukocytes, UA MODERATE (*)    Squamous Epithelial / LPF 0-5 (*)    All other components within normal limits  LIPASE, BLOOD  CBC    EKG  EKG Interpretation  Date/Time:  Sunday March 11 2016 16:31:51 EST Ventricular Rate:  90 PR Interval:    QRS Duration: 101 QT Interval:  383 QTC Calculation: 469 R Axis:   -39 Text Interpretation:  Sinus rhythm Multiform ventricular premature complexes Left axis deviation Since last tracing PVC are new Confirmed by Eulis Foster  MD, Roslin Norwood [id number]) on 03/11/2016 5:56:41 PM       Radiology No results found.  Procedures Procedures (including critical care time)  Medications Ordered in ED Medications - No data to display   Initial Impression /  Assessment and Plan / ED Course  I have reviewed the triage vital signs and the nursing notes.  Pertinent labs & imaging results that were available during my care of the patient were reviewed by me and considered in my medical decision making (see chart for details).  Clinical Course     Medications - No data to display  Patient Vitals for the past 24 hrs:  BP Temp Temp src Pulse Resp SpO2 Height Weight  03/11/16 2203 139/89 - - 88 16 94 % - -  03/11/16 1853  139/80 98.2 F (36.8 C) Oral 93 18 100 % - -  03/11/16 1641 - - - 98 - - - -  03/11/16 1618 (!) 144/120 97.5 F (36.4 C) Oral (!) 32 24 100 % 5\' 7"  (1.702 m) 230 lb 9.6 oz (104.6 kg)    10:14 PM Reevaluation with update and discussion. After initial assessment and treatment, an updated evaluation reveals No change in clinical status. Findings discussed with the patient and all questions were answered. Iysha Mishkin L    Final Clinical Impressions(s) / ED Diagnoses   Final diagnoses:  Muscle strain   Chest wall pain, persistent, without significant disability. Doubt rib fracture, pneumothorax, radiculopathy.  Nursing Notes Reviewed/ Care Coordinated Applicable Imaging Reviewed Interpretation of Laboratory Data incorporated into ED treatment  The patient appears reasonably screened and/or stabilized for discharge and I doubt any other medical condition or other [facility] requiring further screening, evaluation, or treatment in the ED at this time prior to discharge.  Plan: Home Medications- APAP prn; Home Treatments- Heat to sore area; return here if the recommended treatment, does not improve the symptoms; Recommended follow up- PCP prn    New Prescriptions New Prescriptions   No medications on file     Daleen Bo, MD 03/11/16 2216

## 2016-03-11 NOTE — Discharge Instructions (Signed)
Use heat on the sore area 3 or 4 times a day.  Take Tylenol every 4 hours as needed for pain.

## 2016-03-11 NOTE — ED Notes (Signed)
Patient states she thought maybe she pulled a muscle. 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 She later began feeling the back pain. It kind of eased off but has come back and now having some pain and soreness across the bilateral flank area. States the pain is worst with movement. It eases off and is more comfortable if she is laying still or sitting with limited movement.

## 2016-03-11 NOTE — ED Triage Notes (Signed)
Pt c/o back pain onset a few days ago, worsening, now radiating to right side, along with intermittent lightheadedness. Worse with palpation and certain movements. Feeling swollen in lower abdominal area, right more than left, jeans difficult to fasten. Right abdomen more firm than left. Tenderness to palpation in LUQ.

## 2016-03-12 DIAGNOSIS — M546 Pain in thoracic spine: Secondary | ICD-10-CM | POA: Diagnosis not present

## 2016-03-20 DIAGNOSIS — R1012 Left upper quadrant pain: Secondary | ICD-10-CM | POA: Diagnosis not present

## 2016-03-20 DIAGNOSIS — R140 Abdominal distension (gaseous): Secondary | ICD-10-CM | POA: Diagnosis not present

## 2016-03-20 DIAGNOSIS — K5730 Diverticulosis of large intestine without perforation or abscess without bleeding: Secondary | ICD-10-CM | POA: Diagnosis not present

## 2016-03-20 DIAGNOSIS — K219 Gastro-esophageal reflux disease without esophagitis: Secondary | ICD-10-CM | POA: Diagnosis not present

## 2016-06-13 DIAGNOSIS — I10 Essential (primary) hypertension: Secondary | ICD-10-CM | POA: Diagnosis not present

## 2016-06-13 DIAGNOSIS — E6609 Other obesity due to excess calories: Secondary | ICD-10-CM | POA: Diagnosis not present

## 2016-06-13 DIAGNOSIS — R600 Localized edema: Secondary | ICD-10-CM | POA: Diagnosis not present

## 2016-06-13 DIAGNOSIS — Z79899 Other long term (current) drug therapy: Secondary | ICD-10-CM | POA: Diagnosis not present

## 2016-06-13 DIAGNOSIS — Z6837 Body mass index (BMI) 37.0-37.9, adult: Secondary | ICD-10-CM | POA: Diagnosis not present

## 2016-06-13 DIAGNOSIS — Z7984 Long term (current) use of oral hypoglycemic drugs: Secondary | ICD-10-CM | POA: Diagnosis not present

## 2016-06-13 DIAGNOSIS — E039 Hypothyroidism, unspecified: Secondary | ICD-10-CM | POA: Diagnosis not present

## 2016-06-13 DIAGNOSIS — E119 Type 2 diabetes mellitus without complications: Secondary | ICD-10-CM | POA: Diagnosis not present

## 2016-06-13 DIAGNOSIS — E7800 Pure hypercholesterolemia, unspecified: Secondary | ICD-10-CM | POA: Diagnosis not present

## 2016-07-12 DIAGNOSIS — M1711 Unilateral primary osteoarthritis, right knee: Secondary | ICD-10-CM | POA: Diagnosis not present

## 2016-07-12 DIAGNOSIS — Z96652 Presence of left artificial knee joint: Secondary | ICD-10-CM | POA: Diagnosis not present

## 2016-07-12 DIAGNOSIS — Z471 Aftercare following joint replacement surgery: Secondary | ICD-10-CM | POA: Diagnosis not present

## 2016-07-12 DIAGNOSIS — M1712 Unilateral primary osteoarthritis, left knee: Secondary | ICD-10-CM | POA: Diagnosis not present

## 2016-07-20 DIAGNOSIS — Z0000 Encounter for general adult medical examination without abnormal findings: Secondary | ICD-10-CM | POA: Diagnosis not present

## 2016-07-20 DIAGNOSIS — D508 Other iron deficiency anemias: Secondary | ICD-10-CM | POA: Diagnosis not present

## 2016-09-17 ENCOUNTER — Other Ambulatory Visit: Payer: Self-pay | Admitting: Obstetrics & Gynecology

## 2016-09-17 DIAGNOSIS — Z1231 Encounter for screening mammogram for malignant neoplasm of breast: Secondary | ICD-10-CM

## 2016-09-27 DIAGNOSIS — K5730 Diverticulosis of large intestine without perforation or abscess without bleeding: Secondary | ICD-10-CM | POA: Diagnosis not present

## 2016-09-27 DIAGNOSIS — R140 Abdominal distension (gaseous): Secondary | ICD-10-CM | POA: Diagnosis not present

## 2016-09-27 DIAGNOSIS — R1012 Left upper quadrant pain: Secondary | ICD-10-CM | POA: Diagnosis not present

## 2016-10-02 DIAGNOSIS — H5213 Myopia, bilateral: Secondary | ICD-10-CM | POA: Diagnosis not present

## 2016-10-02 DIAGNOSIS — H524 Presbyopia: Secondary | ICD-10-CM | POA: Diagnosis not present

## 2016-10-02 DIAGNOSIS — E113291 Type 2 diabetes mellitus with mild nonproliferative diabetic retinopathy without macular edema, right eye: Secondary | ICD-10-CM | POA: Diagnosis not present

## 2016-10-02 DIAGNOSIS — H52223 Regular astigmatism, bilateral: Secondary | ICD-10-CM | POA: Diagnosis not present

## 2016-10-09 DIAGNOSIS — E113212 Type 2 diabetes mellitus with mild nonproliferative diabetic retinopathy with macular edema, left eye: Secondary | ICD-10-CM | POA: Diagnosis not present

## 2016-10-18 DIAGNOSIS — Z124 Encounter for screening for malignant neoplasm of cervix: Secondary | ICD-10-CM | POA: Diagnosis not present

## 2016-10-18 DIAGNOSIS — Z1272 Encounter for screening for malignant neoplasm of vagina: Secondary | ICD-10-CM | POA: Diagnosis not present

## 2016-10-18 DIAGNOSIS — Z6836 Body mass index (BMI) 36.0-36.9, adult: Secondary | ICD-10-CM | POA: Diagnosis not present

## 2016-10-25 ENCOUNTER — Ambulatory Visit
Admission: RE | Admit: 2016-10-25 | Discharge: 2016-10-25 | Disposition: A | Discharge status: Home / Self Care | Payer: Medicare Other | Source: Ambulatory Visit | Attending: Obstetrics & Gynecology | Admitting: Obstetrics & Gynecology

## 2016-10-25 DIAGNOSIS — Z1231 Encounter for screening mammogram for malignant neoplasm of breast: Secondary | ICD-10-CM | POA: Diagnosis not present

## 2016-10-26 ENCOUNTER — Other Ambulatory Visit: Payer: Self-pay | Admitting: Obstetrics & Gynecology

## 2016-10-26 DIAGNOSIS — R928 Other abnormal and inconclusive findings on diagnostic imaging of breast: Secondary | ICD-10-CM

## 2016-11-08 ENCOUNTER — Other Ambulatory Visit: Payer: Self-pay | Admitting: Obstetrics & Gynecology

## 2016-11-08 ENCOUNTER — Ambulatory Visit
Admission: RE | Admit: 2016-11-08 | Discharge: 2016-11-08 | Disposition: A | Discharge status: Home / Self Care | Payer: Medicare Other | Source: Ambulatory Visit | Attending: Obstetrics & Gynecology | Admitting: Obstetrics & Gynecology

## 2016-11-08 DIAGNOSIS — R928 Other abnormal and inconclusive findings on diagnostic imaging of breast: Secondary | ICD-10-CM

## 2016-11-08 DIAGNOSIS — R921 Mammographic calcification found on diagnostic imaging of breast: Secondary | ICD-10-CM | POA: Diagnosis not present

## 2016-11-16 ENCOUNTER — Ambulatory Visit
Admission: RE | Admit: 2016-11-16 | Discharge: 2016-11-16 | Disposition: A | Discharge status: Home / Self Care | Payer: Medicare Other | Source: Ambulatory Visit | Attending: Obstetrics & Gynecology | Admitting: Obstetrics & Gynecology

## 2016-11-16 ENCOUNTER — Other Ambulatory Visit: Payer: Self-pay | Admitting: Obstetrics & Gynecology

## 2016-11-16 DIAGNOSIS — D241 Benign neoplasm of right breast: Secondary | ICD-10-CM | POA: Diagnosis not present

## 2016-11-16 DIAGNOSIS — R921 Mammographic calcification found on diagnostic imaging of breast: Secondary | ICD-10-CM

## 2016-11-16 DIAGNOSIS — R928 Other abnormal and inconclusive findings on diagnostic imaging of breast: Secondary | ICD-10-CM

## 2017-01-21 DIAGNOSIS — R0789 Other chest pain: Secondary | ICD-10-CM | POA: Diagnosis not present

## 2017-01-21 DIAGNOSIS — R1012 Left upper quadrant pain: Secondary | ICD-10-CM | POA: Diagnosis not present

## 2017-01-21 DIAGNOSIS — E119 Type 2 diabetes mellitus without complications: Secondary | ICD-10-CM | POA: Diagnosis not present

## 2017-04-09 DIAGNOSIS — E119 Type 2 diabetes mellitus without complications: Secondary | ICD-10-CM | POA: Diagnosis not present

## 2017-04-30 DIAGNOSIS — R1012 Left upper quadrant pain: Secondary | ICD-10-CM | POA: Diagnosis not present

## 2017-04-30 DIAGNOSIS — Z86010 Personal history of colonic polyps: Secondary | ICD-10-CM | POA: Diagnosis not present

## 2017-04-30 DIAGNOSIS — K5730 Diverticulosis of large intestine without perforation or abscess without bleeding: Secondary | ICD-10-CM | POA: Diagnosis not present

## 2017-04-30 DIAGNOSIS — M940 Chondrocostal junction syndrome [Tietze]: Secondary | ICD-10-CM | POA: Diagnosis not present

## 2017-07-26 DIAGNOSIS — G8929 Other chronic pain: Secondary | ICD-10-CM | POA: Diagnosis not present

## 2017-07-26 DIAGNOSIS — R350 Frequency of micturition: Secondary | ICD-10-CM | POA: Diagnosis not present

## 2017-07-26 DIAGNOSIS — Z1389 Encounter for screening for other disorder: Secondary | ICD-10-CM | POA: Diagnosis not present

## 2017-07-26 DIAGNOSIS — Z0000 Encounter for general adult medical examination without abnormal findings: Secondary | ICD-10-CM | POA: Diagnosis not present

## 2017-07-26 DIAGNOSIS — M545 Low back pain: Secondary | ICD-10-CM | POA: Diagnosis not present

## 2017-07-26 DIAGNOSIS — D649 Anemia, unspecified: Secondary | ICD-10-CM | POA: Diagnosis not present

## 2017-07-26 DIAGNOSIS — Z6837 Body mass index (BMI) 37.0-37.9, adult: Secondary | ICD-10-CM | POA: Diagnosis not present

## 2017-07-26 DIAGNOSIS — E113291 Type 2 diabetes mellitus with mild nonproliferative diabetic retinopathy without macular edema, right eye: Secondary | ICD-10-CM | POA: Diagnosis not present

## 2017-07-26 DIAGNOSIS — E7800 Pure hypercholesterolemia, unspecified: Secondary | ICD-10-CM | POA: Diagnosis not present

## 2017-07-26 DIAGNOSIS — E039 Hypothyroidism, unspecified: Secondary | ICD-10-CM | POA: Diagnosis not present

## 2017-07-26 DIAGNOSIS — I10 Essential (primary) hypertension: Secondary | ICD-10-CM | POA: Diagnosis not present

## 2017-07-29 DIAGNOSIS — R350 Frequency of micturition: Secondary | ICD-10-CM | POA: Diagnosis not present

## 2017-08-26 ENCOUNTER — Other Ambulatory Visit: Payer: Self-pay | Admitting: Obstetrics & Gynecology

## 2017-08-26 DIAGNOSIS — Z1231 Encounter for screening mammogram for malignant neoplasm of breast: Secondary | ICD-10-CM

## 2017-10-21 DIAGNOSIS — Z6837 Body mass index (BMI) 37.0-37.9, adult: Secondary | ICD-10-CM | POA: Diagnosis not present

## 2017-10-21 DIAGNOSIS — Z01419 Encounter for gynecological examination (general) (routine) without abnormal findings: Secondary | ICD-10-CM | POA: Diagnosis not present

## 2017-10-21 DIAGNOSIS — N760 Acute vaginitis: Secondary | ICD-10-CM | POA: Diagnosis not present

## 2017-10-22 DIAGNOSIS — E113291 Type 2 diabetes mellitus with mild nonproliferative diabetic retinopathy without macular edema, right eye: Secondary | ICD-10-CM | POA: Diagnosis not present

## 2017-10-24 DIAGNOSIS — J0100 Acute maxillary sinusitis, unspecified: Secondary | ICD-10-CM | POA: Diagnosis not present

## 2017-10-29 ENCOUNTER — Ambulatory Visit: Payer: Medicare Other

## 2017-11-21 ENCOUNTER — Ambulatory Visit
Admission: RE | Admit: 2017-11-21 | Discharge: 2017-11-21 | Disposition: A | Discharge status: Home / Self Care | Payer: Medicare Other | Source: Ambulatory Visit | Attending: Obstetrics & Gynecology | Admitting: Obstetrics & Gynecology

## 2017-11-21 DIAGNOSIS — Z1231 Encounter for screening mammogram for malignant neoplasm of breast: Secondary | ICD-10-CM

## 2017-11-22 ENCOUNTER — Other Ambulatory Visit: Payer: Self-pay | Admitting: Obstetrics & Gynecology

## 2017-11-22 DIAGNOSIS — R928 Other abnormal and inconclusive findings on diagnostic imaging of breast: Secondary | ICD-10-CM

## 2017-11-28 ENCOUNTER — Other Ambulatory Visit: Payer: Self-pay | Admitting: Obstetrics & Gynecology

## 2017-11-28 ENCOUNTER — Ambulatory Visit: Payer: Medicare Other

## 2017-11-28 ENCOUNTER — Ambulatory Visit
Admission: RE | Admit: 2017-11-28 | Discharge: 2017-11-28 | Disposition: A | Discharge status: Home / Self Care | Payer: Medicare Other | Source: Ambulatory Visit | Attending: Obstetrics & Gynecology | Admitting: Obstetrics & Gynecology

## 2017-11-28 DIAGNOSIS — R928 Other abnormal and inconclusive findings on diagnostic imaging of breast: Secondary | ICD-10-CM | POA: Diagnosis not present

## 2017-11-28 DIAGNOSIS — N6489 Other specified disorders of breast: Secondary | ICD-10-CM

## 2017-12-02 DIAGNOSIS — M549 Dorsalgia, unspecified: Secondary | ICD-10-CM | POA: Diagnosis not present

## 2017-12-02 DIAGNOSIS — M48061 Spinal stenosis, lumbar region without neurogenic claudication: Secondary | ICD-10-CM | POA: Diagnosis not present

## 2017-12-17 DIAGNOSIS — M5136 Other intervertebral disc degeneration, lumbar region: Secondary | ICD-10-CM | POA: Diagnosis not present

## 2017-12-27 DIAGNOSIS — M25562 Pain in left knee: Secondary | ICD-10-CM | POA: Diagnosis not present

## 2017-12-27 DIAGNOSIS — M1711 Unilateral primary osteoarthritis, right knee: Secondary | ICD-10-CM | POA: Diagnosis not present

## 2017-12-27 DIAGNOSIS — M25561 Pain in right knee: Secondary | ICD-10-CM | POA: Diagnosis not present

## 2018-01-06 ENCOUNTER — Other Ambulatory Visit (HOSPITAL_COMMUNITY): Payer: Self-pay | Admitting: Student

## 2018-01-06 DIAGNOSIS — M25561 Pain in right knee: Secondary | ICD-10-CM

## 2018-01-17 ENCOUNTER — Encounter (HOSPITAL_COMMUNITY)
Admission: RE | Admit: 2018-01-17 | Discharge: 2018-01-17 | Disposition: A | Discharge status: Home / Self Care | Payer: Medicare Other | Source: Ambulatory Visit | Attending: Student | Admitting: Student

## 2018-01-17 DIAGNOSIS — M25561 Pain in right knee: Secondary | ICD-10-CM | POA: Insufficient documentation

## 2018-01-17 MED ORDER — TECHNETIUM TC 99M MEDRONATE IV KIT
20.0000 | PACK | Freq: Once | INTRAVENOUS | Status: AC | PRN
  Administered 2018-01-17: 20 via INTRAVENOUS

## 2018-01-28 DIAGNOSIS — M545 Low back pain: Secondary | ICD-10-CM | POA: Diagnosis not present

## 2018-01-31 DIAGNOSIS — E1140 Type 2 diabetes mellitus with diabetic neuropathy, unspecified: Secondary | ICD-10-CM | POA: Diagnosis not present

## 2018-01-31 DIAGNOSIS — M47896 Other spondylosis, lumbar region: Secondary | ICD-10-CM | POA: Diagnosis not present

## 2018-01-31 DIAGNOSIS — E113291 Type 2 diabetes mellitus with mild nonproliferative diabetic retinopathy without macular edema, right eye: Secondary | ICD-10-CM | POA: Diagnosis not present

## 2018-01-31 DIAGNOSIS — M545 Low back pain: Secondary | ICD-10-CM | POA: Diagnosis not present

## 2018-01-31 DIAGNOSIS — E7800 Pure hypercholesterolemia, unspecified: Secondary | ICD-10-CM | POA: Diagnosis not present

## 2018-01-31 DIAGNOSIS — I10 Essential (primary) hypertension: Secondary | ICD-10-CM | POA: Diagnosis not present

## 2018-01-31 DIAGNOSIS — E039 Hypothyroidism, unspecified: Secondary | ICD-10-CM | POA: Diagnosis not present

## 2018-01-31 DIAGNOSIS — Z6837 Body mass index (BMI) 37.0-37.9, adult: Secondary | ICD-10-CM | POA: Diagnosis not present

## 2018-02-07 DIAGNOSIS — M545 Low back pain: Secondary | ICD-10-CM | POA: Diagnosis not present

## 2018-02-10 DIAGNOSIS — M545 Low back pain: Secondary | ICD-10-CM | POA: Diagnosis not present

## 2018-02-13 DIAGNOSIS — M545 Low back pain: Secondary | ICD-10-CM | POA: Diagnosis not present

## 2018-02-24 DIAGNOSIS — M545 Low back pain: Secondary | ICD-10-CM | POA: Diagnosis not present

## 2018-02-27 DIAGNOSIS — M545 Low back pain: Secondary | ICD-10-CM | POA: Diagnosis not present

## 2018-03-07 DIAGNOSIS — M545 Low back pain: Secondary | ICD-10-CM | POA: Diagnosis not present

## 2018-03-11 DIAGNOSIS — M25869 Other specified joint disorders, unspecified knee: Secondary | ICD-10-CM | POA: Diagnosis not present

## 2018-03-11 DIAGNOSIS — M25562 Pain in left knee: Secondary | ICD-10-CM | POA: Diagnosis not present

## 2018-03-18 DIAGNOSIS — M19011 Primary osteoarthritis, right shoulder: Secondary | ICD-10-CM | POA: Diagnosis not present

## 2018-03-18 DIAGNOSIS — M25511 Pain in right shoulder: Secondary | ICD-10-CM | POA: Diagnosis not present

## 2018-03-26 DIAGNOSIS — R197 Diarrhea, unspecified: Secondary | ICD-10-CM | POA: Diagnosis not present

## 2018-03-26 DIAGNOSIS — R1012 Left upper quadrant pain: Secondary | ICD-10-CM | POA: Diagnosis not present

## 2018-04-01 DIAGNOSIS — Z96652 Presence of left artificial knee joint: Secondary | ICD-10-CM | POA: Diagnosis not present

## 2018-04-01 DIAGNOSIS — M65862 Other synovitis and tenosynovitis, left lower leg: Secondary | ICD-10-CM | POA: Diagnosis not present

## 2018-04-14 DIAGNOSIS — E119 Type 2 diabetes mellitus without complications: Secondary | ICD-10-CM | POA: Diagnosis not present

## 2018-04-14 DIAGNOSIS — R197 Diarrhea, unspecified: Secondary | ICD-10-CM | POA: Diagnosis not present

## 2018-04-14 DIAGNOSIS — R634 Abnormal weight loss: Secondary | ICD-10-CM | POA: Diagnosis not present

## 2018-04-16 DIAGNOSIS — M25511 Pain in right shoulder: Secondary | ICD-10-CM | POA: Diagnosis not present

## 2018-04-23 ENCOUNTER — Other Ambulatory Visit: Payer: Self-pay | Admitting: Family Medicine

## 2018-04-23 ENCOUNTER — Ambulatory Visit
Admission: RE | Admit: 2018-04-23 | Discharge: 2018-04-23 | Disposition: A | Discharge status: Home / Self Care | Payer: Medicare Other | Source: Ambulatory Visit | Attending: Family Medicine | Admitting: Family Medicine

## 2018-04-23 DIAGNOSIS — R1084 Generalized abdominal pain: Secondary | ICD-10-CM | POA: Diagnosis not present

## 2018-04-23 DIAGNOSIS — R1032 Left lower quadrant pain: Secondary | ICD-10-CM

## 2018-04-23 DIAGNOSIS — R634 Abnormal weight loss: Secondary | ICD-10-CM

## 2018-04-23 DIAGNOSIS — K429 Umbilical hernia without obstruction or gangrene: Secondary | ICD-10-CM | POA: Diagnosis not present

## 2018-04-23 DIAGNOSIS — Z6833 Body mass index (BMI) 33.0-33.9, adult: Secondary | ICD-10-CM | POA: Diagnosis not present

## 2018-04-23 DIAGNOSIS — K5730 Diverticulosis of large intestine without perforation or abscess without bleeding: Secondary | ICD-10-CM | POA: Diagnosis not present

## 2018-04-23 DIAGNOSIS — E1140 Type 2 diabetes mellitus with diabetic neuropathy, unspecified: Secondary | ICD-10-CM | POA: Diagnosis not present

## 2018-04-23 MED ORDER — IOPAMIDOL (ISOVUE-300) INJECTION 61%
100.0000 mL | Freq: Once | INTRAVENOUS | Status: AC | PRN
  Administered 2018-04-23: 100 mL via INTRAVENOUS

## 2018-04-30 DIAGNOSIS — M19011 Primary osteoarthritis, right shoulder: Secondary | ICD-10-CM | POA: Diagnosis not present

## 2018-04-30 DIAGNOSIS — M25511 Pain in right shoulder: Secondary | ICD-10-CM | POA: Diagnosis not present

## 2018-05-08 DIAGNOSIS — M25511 Pain in right shoulder: Secondary | ICD-10-CM | POA: Diagnosis not present

## 2018-05-21 DIAGNOSIS — R634 Abnormal weight loss: Secondary | ICD-10-CM | POA: Diagnosis not present

## 2018-05-21 DIAGNOSIS — R109 Unspecified abdominal pain: Secondary | ICD-10-CM | POA: Diagnosis not present

## 2018-05-27 DIAGNOSIS — R634 Abnormal weight loss: Secondary | ICD-10-CM | POA: Diagnosis not present

## 2018-05-27 DIAGNOSIS — Z800 Family history of malignant neoplasm of digestive organs: Secondary | ICD-10-CM | POA: Diagnosis not present

## 2018-05-27 DIAGNOSIS — K5730 Diverticulosis of large intestine without perforation or abscess without bleeding: Secondary | ICD-10-CM | POA: Diagnosis not present

## 2018-05-27 DIAGNOSIS — R1031 Right lower quadrant pain: Secondary | ICD-10-CM | POA: Diagnosis not present

## 2018-05-27 DIAGNOSIS — D649 Anemia, unspecified: Secondary | ICD-10-CM | POA: Diagnosis not present

## 2018-05-28 ENCOUNTER — Other Ambulatory Visit: Payer: Self-pay | Admitting: Gastroenterology

## 2018-05-28 DIAGNOSIS — R6889 Other general symptoms and signs: Secondary | ICD-10-CM

## 2018-05-28 DIAGNOSIS — R109 Unspecified abdominal pain: Secondary | ICD-10-CM

## 2018-05-30 ENCOUNTER — Other Ambulatory Visit: Payer: Medicare Other

## 2018-06-03 ENCOUNTER — Other Ambulatory Visit: Payer: Medicare Other

## 2018-06-05 DIAGNOSIS — K5730 Diverticulosis of large intestine without perforation or abscess without bleeding: Secondary | ICD-10-CM | POA: Diagnosis not present

## 2018-06-05 DIAGNOSIS — K5900 Constipation, unspecified: Secondary | ICD-10-CM | POA: Diagnosis not present

## 2018-06-05 DIAGNOSIS — R634 Abnormal weight loss: Secondary | ICD-10-CM | POA: Diagnosis not present

## 2018-06-19 ENCOUNTER — Other Ambulatory Visit: Payer: Self-pay

## 2018-06-19 ENCOUNTER — Ambulatory Visit: Payer: Medicare Other

## 2018-06-19 ENCOUNTER — Ambulatory Visit
Admission: RE | Admit: 2018-06-19 | Discharge: 2018-06-19 | Disposition: A | Discharge status: Home / Self Care | Payer: Medicare Other | Source: Ambulatory Visit | Attending: Obstetrics & Gynecology | Admitting: Obstetrics & Gynecology

## 2018-06-19 DIAGNOSIS — N6489 Other specified disorders of breast: Secondary | ICD-10-CM

## 2018-07-03 DIAGNOSIS — K5730 Diverticulosis of large intestine without perforation or abscess without bleeding: Secondary | ICD-10-CM | POA: Diagnosis not present

## 2018-07-03 DIAGNOSIS — K5900 Constipation, unspecified: Secondary | ICD-10-CM | POA: Diagnosis not present

## 2018-07-03 DIAGNOSIS — R140 Abdominal distension (gaseous): Secondary | ICD-10-CM | POA: Diagnosis not present

## 2018-08-15 DIAGNOSIS — Z1159 Encounter for screening for other viral diseases: Secondary | ICD-10-CM | POA: Diagnosis not present

## 2018-08-15 DIAGNOSIS — E1140 Type 2 diabetes mellitus with diabetic neuropathy, unspecified: Secondary | ICD-10-CM | POA: Diagnosis not present

## 2018-08-15 DIAGNOSIS — I10 Essential (primary) hypertension: Secondary | ICD-10-CM | POA: Diagnosis not present

## 2018-08-15 DIAGNOSIS — E039 Hypothyroidism, unspecified: Secondary | ICD-10-CM | POA: Diagnosis not present

## 2018-08-15 DIAGNOSIS — Z0000 Encounter for general adult medical examination without abnormal findings: Secondary | ICD-10-CM | POA: Diagnosis not present

## 2018-08-15 DIAGNOSIS — Z6832 Body mass index (BMI) 32.0-32.9, adult: Secondary | ICD-10-CM | POA: Diagnosis not present

## 2018-08-15 DIAGNOSIS — E7800 Pure hypercholesterolemia, unspecified: Secondary | ICD-10-CM | POA: Diagnosis not present

## 2018-09-11 DIAGNOSIS — K5900 Constipation, unspecified: Secondary | ICD-10-CM | POA: Diagnosis not present

## 2018-09-11 DIAGNOSIS — Z800 Family history of malignant neoplasm of digestive organs: Secondary | ICD-10-CM | POA: Diagnosis not present

## 2018-09-11 DIAGNOSIS — R1032 Left lower quadrant pain: Secondary | ICD-10-CM | POA: Diagnosis not present

## 2018-09-11 DIAGNOSIS — K5730 Diverticulosis of large intestine without perforation or abscess without bleeding: Secondary | ICD-10-CM | POA: Diagnosis not present

## 2018-09-14 ENCOUNTER — Emergency Department (HOSPITAL_COMMUNITY): Payer: Medicare Other

## 2018-09-14 ENCOUNTER — Encounter (HOSPITAL_COMMUNITY): Payer: Self-pay

## 2018-09-14 ENCOUNTER — Emergency Department (HOSPITAL_COMMUNITY)
Admission: EM | Admit: 2018-09-14 | Discharge: 2018-09-14 | Disposition: A | Discharge status: Home / Self Care | Payer: Medicare Other | Attending: Emergency Medicine | Admitting: Emergency Medicine

## 2018-09-14 ENCOUNTER — Other Ambulatory Visit: Payer: Self-pay

## 2018-09-14 DIAGNOSIS — R001 Bradycardia, unspecified: Secondary | ICD-10-CM | POA: Diagnosis not present

## 2018-09-14 DIAGNOSIS — R110 Nausea: Secondary | ICD-10-CM | POA: Diagnosis not present

## 2018-09-14 DIAGNOSIS — Z7982 Long term (current) use of aspirin: Secondary | ICD-10-CM | POA: Diagnosis not present

## 2018-09-14 DIAGNOSIS — Z79899 Other long term (current) drug therapy: Secondary | ICD-10-CM | POA: Diagnosis not present

## 2018-09-14 DIAGNOSIS — I10 Essential (primary) hypertension: Secondary | ICD-10-CM | POA: Diagnosis not present

## 2018-09-14 DIAGNOSIS — R42 Dizziness and giddiness: Secondary | ICD-10-CM | POA: Insufficient documentation

## 2018-09-14 DIAGNOSIS — R1110 Vomiting, unspecified: Secondary | ICD-10-CM | POA: Diagnosis not present

## 2018-09-14 DIAGNOSIS — E119 Type 2 diabetes mellitus without complications: Secondary | ICD-10-CM | POA: Diagnosis not present

## 2018-09-14 DIAGNOSIS — R0902 Hypoxemia: Secondary | ICD-10-CM | POA: Diagnosis not present

## 2018-09-14 LAB — COMPREHENSIVE METABOLIC PANEL
ALT: 25 U/L (ref 0–44)
AST: 44 U/L — ABNORMAL HIGH (ref 15–41)
Albumin: 4 g/dL (ref 3.5–5.0)
Alkaline Phosphatase: 83 U/L (ref 38–126)
Anion gap: 14 (ref 5–15)
BUN: 15 mg/dL (ref 8–23)
CO2: 18 mmol/L — ABNORMAL LOW (ref 22–32)
Calcium: 9.3 mg/dL (ref 8.9–10.3)
Chloride: 107 mmol/L (ref 98–111)
Creatinine, Ser: 0.72 mg/dL (ref 0.44–1.00)
GFR calc Af Amer: >60 mL/min (ref >60)
GFR calc non Af Amer: >60 mL/min (ref >60)
Glucose, Bld: 162 mg/dL — ABNORMAL HIGH (ref 70–99)
Potassium: 4.9 mmol/L (ref 3.5–5.1)
Sodium: 139 mmol/L (ref 135–145)
Total Bilirubin: 0.3 mg/dL (ref 0.3–1.2)
Total Protein: 7.7 g/dL (ref 6.5–8.1)

## 2018-09-14 LAB — CBC WITH DIFFERENTIAL/PLATELET
Abs Immature Granulocytes: 0.06 10*3/uL (ref 0.00–0.07)
Basophils Absolute: 0.1 10*3/uL (ref 0.0–0.1)
Basophils Relative: 1 %
Eosinophils Absolute: 0.1 10*3/uL (ref 0.0–0.5)
Eosinophils Relative: 1 %
HCT: 40.3 % (ref 36.0–46.0)
Hemoglobin: 12.1 g/dL (ref 12.0–15.0)
Immature Granulocytes: 1 %
Lymphocytes Relative: 10 %
Lymphs Abs: 1.2 10*3/uL (ref 0.7–4.0)
MCH: 28.6 pg (ref 26.0–34.0)
MCHC: 30 g/dL (ref 30.0–36.0)
MCV: 95.3 fL (ref 80.0–100.0)
Monocytes Absolute: 0.4 10*3/uL (ref 0.1–1.0)
Monocytes Relative: 4 %
Neutro Abs: 10.2 10*3/uL — ABNORMAL HIGH (ref 1.7–7.7)
Neutrophils Relative %: 83 %
Platelets: 285 10*3/uL (ref 150–400)
RBC: 4.23 MIL/uL (ref 3.87–5.11)
RDW: 14.6 % (ref 11.5–15.5)
WBC: 12 10*3/uL — ABNORMAL HIGH (ref 4.0–10.5)
nRBC: 0 % (ref 0.0–0.2)

## 2018-09-14 LAB — URINALYSIS, ROUTINE W REFLEX MICROSCOPIC
Bilirubin Urine: NEGATIVE
Glucose, UA: NEGATIVE mg/dL
Ketones, ur: NEGATIVE mg/dL
Nitrite: NEGATIVE
Protein, ur: NEGATIVE mg/dL
Specific Gravity, Urine: 1.01 (ref 1.005–1.030)
pH: 5 (ref 5.0–8.0)

## 2018-09-14 LAB — TROPONIN I (HIGH SENSITIVITY): Troponin I (High Sensitivity): 4 ng/L (ref <18)

## 2018-09-14 MED ORDER — MECLIZINE HCL 25 MG PO TABS: ORAL_TABLET | ORAL | 0 refills | Status: DC

## 2018-09-14 MED ORDER — SODIUM CHLORIDE 0.9 % IV BOLUS
1000.0000 mL | Freq: Once | INTRAVENOUS | Status: AC
  Administered 2018-09-14: 11:00:00 1000 mL via INTRAVENOUS

## 2018-09-14 NOTE — ED Notes (Signed)
Patient transported to CT 

## 2018-09-14 NOTE — ED Notes (Signed)
Pt reports feeling dizzy this morning appx 0700. Pt denies diarrhea, vomiting.  Denies any recent change in medication.  Denies any recent falls.  Pt reports having on going abdominal issues, being followed by GI doctor.

## 2018-09-14 NOTE — ED Notes (Signed)
ED Provider at bedside. 

## 2018-09-14 NOTE — ED Notes (Signed)
Pt assisted to bathroom with one person assistance.  Pt ambulated, progressively veering right while walking.  Pt assisted to wheelchair and then wheeled the rest of the way to bathroom.

## 2018-09-14 NOTE — ED Provider Notes (Signed)
[facility] DEPT Provider Note   CSN: [id number] Arrival date & time: 09/14/18  0848     History   Chief Complaint Chief Complaint  Patient presents with  . Dizziness    HPI Michele Dawson is a 78 y.o. female.     Patient complains of some dizziness today.  It seems to be more lightheaded feeling.  Patient improved some before she came to the hospital  The history is provided by the patient. No language interpreter was used.  Dizziness Quality:  Lightheadedness Severity:  Moderate Onset quality:  Sudden Timing:  Intermittent Progression:  Improving Chronicity:  New Context: not when bending over   Relieved by:  Nothing Worsened by:  Nothing Ineffective treatments:  None tried Associated symptoms: no blood in stool, no chest pain, no diarrhea and no headaches     Past Medical History:  Diagnosis Date  . Arthritis   . Diabetes mellitus   . Diverticulosis   . Family history of malignant neoplasm of gastrointestinal tract   . GERD (gastroesophageal reflux disease)   . Hyperlipidemia   . Hypertension     Patient Active Problem List   Diagnosis Date Noted  . THYROID DISORDER 04/22/2007  . DM 04/22/2007  . HYPERLIPIDEMIA 04/22/2007  . UNSPECIFIED SINUSITIS 04/22/2007  . DIVERTICULOSIS, COLON 04/22/2007  . ARTHRITIS 04/22/2007  . ELEVATED BLOOD PRESSURE 04/22/2007    Past Surgical History:  Procedure Laterality Date  . BREAST BIOPSY Right    2017-2018  . CARPAL TUNNEL RELEASE     left  . NASAL SEPTUM SURGERY    . TOTAL KNEE ARTHROPLASTY     left  . VAGINAL HYSTERECTOMY     with anterior and posterior vaginal repair     OB History   No obstetric history on file.      Home Medications    Prior to Admission medications   Medication Sig Start Date End Date Taking? Authorizing Provider  acetaminophen (TYLENOL) 325 MG tablet Take 325 mg by mouth every 6 (six) hours as needed for moderate pain or headache.   Yes  [provider]  amLODipine (NORVASC) 10 MG tablet Take 10 mg by mouth daily.     Yes [provider]  aspirin 81 MG tablet Take 81 mg by mouth daily.     Yes [provider]  atorvastatin (LIPITOR) 80 MG tablet Take 80 mg by mouth daily.     Yes [provider]  Cholecalciferol (VITAMIN D3) 1000 UNITS CAPS Take 1 capsule by mouth daily.    Yes [provider]  levothyroxine (SYNTHROID, LEVOTHROID) 125 MCG tablet Take 125 mcg by mouth daily.     Yes [provider]  lisinopril (PRINIVIL,ZESTRIL) 20 MG tablet Take 20 mg by mouth daily.     Yes [provider]  metFORMIN (GLUMETZA) 500 MG (MOD) 24 hr tablet Take 500 mg by mouth daily with breakfast. Take 2 tablets by mouth twice daily    Yes [provider]  pantoprazole (PROTONIX) 40 MG tablet Take 40 mg by mouth daily.     Yes [provider]  vitamin B-12 (CYANOCOBALAMIN) 1000 MCG tablet Take 1,000 mcg by mouth daily.     Yes [provider]  dicyclomine (BENTYL) 10 MG capsule Take 1 capsule (10 mg total) by mouth 2 (two) times daily. Take one po TID Patient not taking: Reported on 09/14/2018 01/29/11   Lafayette Dragon, MD  meclizine (ANTIVERT) 25 MG tablet Take 1  every 8 hours if you have dizziness that involves the room spinning around.  Do not take the medicine if you just feel lightheaded 09/14/18   Milton Ferguson, MD  Probiotic Product (ALIGN) 4 MG CAPS Take 1 capsule by mouth 1 day or 1 dose. Patient not taking: Reported on 09/14/2018 01/29/11   Lafayette Dragon, MD    Family History Family History  Problem Relation Age of Onset  . Colon cancer Sister   . Breast cancer Mother   . Tongue cancer Father   . Heart disease Father   . Breast cancer Other        aunt x 2    Social History Social History   Tobacco Use  . Smoking status: Never Smoker  . Smokeless tobacco: Never Used  Substance Use Topics  . Alcohol use: Yes    Comment: socially  .  Drug use: No     Allergies   Shellfish allergy, Sulfa antibiotics, and Penicillins   Review of Systems Review of Systems  Constitutional: Negative for appetite change and fatigue.  HENT: Negative for congestion, ear discharge and sinus pressure.   Eyes: Negative for discharge.  Respiratory: Negative for cough.   Cardiovascular: Negative for chest pain.  Gastrointestinal: Negative for abdominal pain, blood in stool and diarrhea.  Genitourinary: Negative for frequency and hematuria.  Musculoskeletal: Negative for back pain.  Skin: Negative for rash.  Neurological: Positive for dizziness. Negative for seizures and headaches.  Psychiatric/Behavioral: Negative for hallucinations.     Physical Exam Updated Vital Signs BP 101/87   Pulse 83   Temp 97.8 F (36.6 C) (Oral)   Resp (!) 24   SpO2 94%   Physical Exam Vitals signs and nursing note reviewed.  Constitutional:      Appearance: She is well-developed.  HENT:     Head: Normocephalic.     Nose: Nose normal.  Eyes:     General: No scleral icterus.    Conjunctiva/sclera: Conjunctivae normal.  Neck:     Musculoskeletal: Neck supple.     Thyroid: No thyromegaly.  Cardiovascular:     Rate and Rhythm: Normal rate and regular rhythm.     Heart sounds: No murmur. No friction rub. No gallop.   Pulmonary:     Breath sounds: No stridor. No wheezing or rales.  Chest:     Chest wall: No tenderness.  Abdominal:     General: There is no distension.     Tenderness: There is no abdominal tenderness. There is no rebound.  Musculoskeletal: Normal range of motion.  Lymphadenopathy:     Cervical: No cervical adenopathy.  Skin:    Findings: No erythema or rash.  Neurological:     Mental Status: She is oriented to person, place, and time.     Motor: No abnormal muscle tone.     Coordination: Coordination normal.  Psychiatric:        Behavior: Behavior normal.      ED Treatments / Results  Labs (all labs ordered are  listed, but only abnormal results are displayed) Labs Reviewed  CBC WITH DIFFERENTIAL/PLATELET - Abnormal; Notable for the following components:      Result Value   WBC 12.0 (*)    Neutro Abs 10.2 (*)    All other components within normal limits  COMPREHENSIVE METABOLIC PANEL - Abnormal; Notable for the following components:   CO2 18 (*)    Glucose, Bld 162 (*)    AST 44 (*)    All  other components within normal limits  URINALYSIS, ROUTINE W REFLEX MICROSCOPIC - Abnormal; Notable for the following components:   Hgb urine dipstick MODERATE (*)    Leukocytes,Ua TRACE (*)    Bacteria, UA RARE (*)    All other components within normal limits  TROPONIN I (HIGH SENSITIVITY)  TROPONIN I (HIGH SENSITIVITY)    EKG None  Radiology Dg Chest 2 View  Result Date: 09/14/2018 CLINICAL DATA:  Dizziness, vomiting. EXAM: CHEST - 2 VIEW COMPARISON:  None. FINDINGS: Borderline cardiomegaly. Lungs appear clear. No pleural effusion or pneumothorax seen. No acute or suspicious osseous finding. Mild degenerative spondylosis of the kyphotic thoracic spine. IMPRESSION: No active cardiopulmonary disease. No evidence of pneumonia or pulmonary edema. Electronically Signed   By: Franki Cabot M.D.   On: 09/14/2018 11:07   Ct Head Wo Contrast  Result Date: 09/14/2018 CLINICAL DATA:  Lightheadedness, dizziness. Vomiting. EXAM: CT HEAD WITHOUT CONTRAST TECHNIQUE: Contiguous axial images were obtained from the base of the skull through the vertex without intravenous contrast. COMPARISON:  None. FINDINGS: Brain: There is mild generalized age related parenchymal volume loss with commensurate dilatation of the sulci. There is no mass, hemorrhage, edema or other evidence of acute parenchymal abnormality. No extra-axial hemorrhage. Vascular: Chronic calcified atherosclerotic changes of the large vessels at the skull base. No unexpected hyperdense vessel. Skull: Normal. Negative for fracture or focal lesion. Sinuses/Orbits:  No acute finding. Other: None. IMPRESSION: Negative head CT. No intracranial mass, hemorrhage or edema. Electronically Signed   By: Franki Cabot M.D.   On: 09/14/2018 11:05    Procedures Procedures (including critical care time)  Medications Ordered in ED Medications  sodium chloride 0.9 % bolus 1,000 mL (1,000 mLs Intravenous New Bag/Given 09/14/18 1119)     Initial Impression / Assessment and Plan / ED Course  I have reviewed the triage vital signs and the nursing notes.  Pertinent labs & imaging results that were available during my care of the patient were reviewed by me and considered in my medical decision making (see chart for details).   Patient's labs are unremarkable.  CT scan negative.  Patient improved with IV fluids.  Suspect dizziness is related to dehydration.   Patient is given some Antivert if she has a spinning component to her dizziness and she will follow-up with her PCP      Final Clinical Impressions(s) / ED Diagnoses   Final diagnoses:  Dizziness    ED Discharge Orders         Ordered    meclizine (ANTIVERT) 25 MG tablet     09/14/18 1343           Milton Ferguson, MD 09/14/18 1350

## 2018-09-14 NOTE — ED Triage Notes (Signed)
Pt BIBA from home.    Per EMS Pr reports waking in the middle of the night, very dizzy, walked into a wall.  Awoke again this morning, still dizzy.  Dry heaves in ambulance.  Denies CP, SHOB.  Reports still feeling dizzy, denies hx of vertigo.

## 2018-09-14 NOTE — Discharge Instructions (Addendum)
Drink plenty of fluids and rest and follow-up with your family doctor this week

## 2018-09-22 DIAGNOSIS — Z7984 Long term (current) use of oral hypoglycemic drugs: Secondary | ICD-10-CM | POA: Diagnosis not present

## 2018-09-22 DIAGNOSIS — R42 Dizziness and giddiness: Secondary | ICD-10-CM | POA: Diagnosis not present

## 2018-09-22 DIAGNOSIS — E1140 Type 2 diabetes mellitus with diabetic neuropathy, unspecified: Secondary | ICD-10-CM | POA: Diagnosis not present

## 2018-09-22 DIAGNOSIS — I10 Essential (primary) hypertension: Secondary | ICD-10-CM | POA: Diagnosis not present

## 2018-10-02 DIAGNOSIS — K5730 Diverticulosis of large intestine without perforation or abscess without bleeding: Secondary | ICD-10-CM | POA: Diagnosis not present

## 2018-10-02 DIAGNOSIS — K5900 Constipation, unspecified: Secondary | ICD-10-CM | POA: Diagnosis not present

## 2018-10-02 DIAGNOSIS — R140 Abdominal distension (gaseous): Secondary | ICD-10-CM | POA: Diagnosis not present

## 2018-10-02 DIAGNOSIS — R634 Abnormal weight loss: Secondary | ICD-10-CM | POA: Diagnosis not present

## 2018-10-03 DIAGNOSIS — I10 Essential (primary) hypertension: Secondary | ICD-10-CM | POA: Diagnosis not present

## 2018-10-03 DIAGNOSIS — E1140 Type 2 diabetes mellitus with diabetic neuropathy, unspecified: Secondary | ICD-10-CM | POA: Diagnosis not present

## 2018-10-03 DIAGNOSIS — R42 Dizziness and giddiness: Secondary | ICD-10-CM | POA: Diagnosis not present

## 2018-10-14 ENCOUNTER — Ambulatory Visit
Admission: RE | Admit: 2018-10-14 | Discharge: 2018-10-14 | Disposition: A | Discharge status: Home / Self Care | Payer: Medicare Other | Source: Ambulatory Visit | Attending: Gastroenterology | Admitting: Gastroenterology

## 2018-10-14 ENCOUNTER — Other Ambulatory Visit: Payer: Self-pay

## 2018-10-14 DIAGNOSIS — R6889 Other general symptoms and signs: Secondary | ICD-10-CM

## 2018-10-14 DIAGNOSIS — K5730 Diverticulosis of large intestine without perforation or abscess without bleeding: Secondary | ICD-10-CM | POA: Diagnosis not present

## 2018-10-14 DIAGNOSIS — R109 Unspecified abdominal pain: Secondary | ICD-10-CM

## 2018-10-14 MED ORDER — IOPAMIDOL (ISOVUE-300) INJECTION 61%
100.0000 mL | Freq: Once | INTRAVENOUS | Status: AC | PRN
  Administered 2018-10-14: 100 mL via INTRAVENOUS

## 2018-10-15 ENCOUNTER — Other Ambulatory Visit: Payer: Self-pay | Admitting: Obstetrics & Gynecology

## 2018-10-15 DIAGNOSIS — Z1231 Encounter for screening mammogram for malignant neoplasm of breast: Secondary | ICD-10-CM

## 2018-10-21 ENCOUNTER — Other Ambulatory Visit: Payer: Self-pay | Admitting: Surgery

## 2018-10-21 DIAGNOSIS — K561 Intussusception: Secondary | ICD-10-CM | POA: Diagnosis not present

## 2018-10-23 ENCOUNTER — Other Ambulatory Visit (HOSPITAL_COMMUNITY)
Admission: RE | Admit: 2018-10-23 | Discharge: 2018-10-23 | Disposition: A | Discharge status: Home / Self Care | Payer: Medicare Other | Source: Ambulatory Visit | Attending: Surgery | Admitting: Surgery

## 2018-10-23 DIAGNOSIS — Z01812 Encounter for preprocedural laboratory examination: Secondary | ICD-10-CM | POA: Insufficient documentation

## 2018-10-23 DIAGNOSIS — Z20828 Contact with and (suspected) exposure to other viral communicable diseases: Secondary | ICD-10-CM | POA: Diagnosis not present

## 2018-10-23 LAB — SARS CORONAVIRUS 2 (TAT 6-24 HRS): SARS Coronavirus 2: NEGATIVE

## 2018-10-24 ENCOUNTER — Encounter (HOSPITAL_COMMUNITY): Payer: Self-pay | Admitting: *Deleted

## 2018-10-24 ENCOUNTER — Other Ambulatory Visit: Payer: Self-pay

## 2018-10-24 NOTE — Progress Notes (Signed)
Michele Dawson denies chest pain or shortness of breath. Patient has been in quarantine since 10/23/2018.   Michele Dawson has type II diabetes, does not check CBG.  I instructed patient to not eat after midnight.  I instructed patient that she could drink clear liquids, we reviewed clear liquids. Clear Liquids until 11:30 am Monday.   I calledd Dr Ninfa Linden' s office and spoke to [facility] to asked if patient if suppose to have a bowel prep or a Pre- Surgery drink. Kennyth Lose called back and said patient does not need bowel prep and follow ERAS diet, no "Pre- Surgery" beverage needed.

## 2018-10-26 ENCOUNTER — Other Ambulatory Visit: Payer: Self-pay | Admitting: Surgery

## 2018-10-26 NOTE — H&P (Signed)
Su Hilt Documented: 10/21/2018 1:32 PM Location: [facility] Patient #: [id number] DOB: 08-23-1940 Divorced / Language: Cleophus Molt / Race: White Female   History of Present Illness (Orry Sigl A. Ninfa Linden MD; 10/21/2018 1:57 PM) The patient is a 78 year old female who presents with abdominal pain. This patient is referred by Dr. Juanita Craver for evaluation of abdominal pain. The patient started having left flank pain which she described as a cramping pain starting in January. It is worse with meals. She has had bloating. She has had intermittent diarrhea and constipation. She has unintentionally lost 50 pounds since January. She had a CAT scan in February which was unremarkable. She has since had another CAT scan in August showing 2 areas of small bowel intussusception. There were no other intra-abdominal abnormalities. The patient has had nausea but no vomiting. She is otherwise without complaints. Her last colonoscopy was a few years ago. She has had diverticulitis in the past.   Past Surgical History Malachi Bonds, CMA; 10/21/2018 1:32 PM) Breast Biopsy  Right. Knee Surgery  Left. Oral Surgery  Tonsillectomy   Diagnostic Studies History Malachi Bonds, CMA; 10/21/2018 1:32 PM) Colonoscopy  1-5 years ago Mammogram  within last year Pap Smear  1-5 years ago  Allergies Malachi Bonds, CMA; 10/21/2018 1:33 PM) Penicillins   Medication History Malachi Bonds, CMA; 10/21/2018 1:34 PM) Levothyroxine Sodium (125MCG Tablet, Oral) Active. Atorvastatin Calcium (80MG  Tablet, Oral) Active. Lisinopril (40MG  Tablet, Oral) Active. metFORMIN HCl (500MG  Tablet, Oral) Active. Vitamin D3 (50 MCG(2000 UT) Tablet Chewable, Oral) Active. Aspirin (81MG  Tablet, Oral) Active. [employer] (170MG  Capsule, Oral) Active. Medications Reconciled  Social History Malachi Bonds, CMA; 10/21/2018 1:32 PM) Alcohol use  Moderate alcohol use. Caffeine use  Coffee,  Tea. No drug use  Tobacco use  Never smoker.  Family History Malachi Bonds, CMA; 10/21/2018 1:32 PM) Alcohol Abuse  Father, Mother. Arthritis  Mother.  Pregnancy / Birth History Malachi Bonds, CMA; 10/21/2018 1:32 PM) Age at menarche  29 years. Age of menopause  52-55 Contraceptive History  Oral contraceptives. Gravida  2 Maternal age  102-30  Other Problems Malachi Bonds, CMA; 10/21/2018 1:32 PM) Arthritis  Back Pain  Diabetes Mellitus  Gastroesophageal Reflux Disease  High blood pressure  Thyroid Disease     Review of Systems (Chemira Jones CMA; 10/21/2018 1:32 PM) General Present- Appetite Loss, Fatigue and Weight Loss. Not Present- Chills, Fever, Night Sweats and Weight Gain. HEENT Present- Seasonal Allergies, Sinus Pain and Wears glasses/contact lenses. Not Present- Earache, Hearing Loss, Hoarseness, Nose Bleed, Oral Ulcers, Ringing in the Ears, Sore Throat, Visual Disturbances and Yellow Eyes. Cardiovascular Present- Shortness of Breath and Swelling of Extremities. Not Present- Chest Pain, Difficulty Breathing Lying Down, Leg Cramps, Palpitations and Rapid Heart Rate. Gastrointestinal Present- Bloating, Change in Bowel Habits, Constipation and Nausea. Not Present- Abdominal Pain, Bloody Stool, Chronic diarrhea, Difficulty Swallowing, Excessive gas, Gets full quickly at meals, Hemorrhoids, Indigestion, Rectal Pain and Vomiting. Female Genitourinary Not Present- Frequency, Nocturia, Painful Urination, Pelvic Pain and Urgency. Musculoskeletal Present- Back Pain, Joint Pain and Joint Stiffness. Not Present- Muscle Pain, Muscle Weakness and Swelling of Extremities. Neurological Present- Headaches and Weakness. Not Present- Decreased Memory, Fainting, Numbness, Seizures, Tingling, Tremor and Trouble walking. Psychiatric Not Present- Anxiety, Bipolar, Change in Sleep Pattern, Depression, Fearful and Frequent crying. Endocrine Not Present- Cold Intolerance, Excessive  Hunger, Hair Changes, Heat Intolerance, Hot flashes and New Diabetes. Hematology Not Present- Blood Thinners, Easy Bruising, Excessive bleeding, Gland problems, HIV and Persistent Infections.  Vitals (Chemira Jones CMA; 10/21/2018 1:33 PM) 10/21/2018 1:33 PM Weight: 187.2 lb Height: 66in Body Surface Area: 1.94 m Body Mass Index: 30.21 kg/m  Temp.: 40F  Pulse: 110 (Regular)  BP: 126/76(Sitting, Left Arm, Standard)       Physical Exam (Shela Esses A. Ninfa Linden MD; 10/21/2018 1:58 PM) General Mental Status-Alert. General Appearance-Consistent with stated age. Hydration-Well hydrated. Voice-Normal.  Head and Neck Head-normocephalic, atraumatic with no lesions or palpable masses. Trachea-midline. Thyroid Gland Characteristics - normal size and consistency.  Eye Eyeball - Bilateral-Extraocular movements intact. Sclera/Conjunctiva - Bilateral-No scleral icterus.  Chest and Lung Exam Chest and lung exam reveals -quiet, even and easy respiratory effort with no use of accessory muscles and on auscultation, normal breath sounds, no adventitious sounds and normal vocal resonance. Inspection Chest Wall - Normal. Back - normal.  Breast Breast - Left-Symmetric, Non Tender, No Biopsy scars, no Dimpling - Left, No Inflammation, No Lumpectomy scars, No Mastectomy scars, No Peau d' Orange. Breast - Right-Symmetric, Non Tender, No Biopsy scars, no Dimpling - Right, No Inflammation, No Lumpectomy scars, No Mastectomy scars, No Peau d' Orange. Breast Lump-No Palpable Breast Mass.  Cardiovascular Cardiovascular examination reveals -normal heart sounds, regular rate and rhythm with no murmurs and normal pedal pulses bilaterally.  Abdomen Inspection Inspection of the abdomen reveals - No Hernias. Skin - Scar - no surgical scars. Palpation/Percussion Palpation and Percussion of the abdomen reveal - Soft, No Rebound tenderness, No Rigidity (guarding) and No  hepatosplenomegaly. Tenderness - Left Lower Quadrant and Left Upper Quadrant. Auscultation Auscultation of the abdomen reveals - Bowel sounds normal. Note: Her abdomen is soft and I cannot feel any mass   Neurologic Neurologic evaluation reveals -alert and oriented x 3 with no impairment of recent or remote memory. Mental Status-Normal.  Musculoskeletal Normal Exam - Left-Upper Extremity Strength Normal and Lower Extremity Strength Normal. Normal Exam - Right-Upper Extremity Strength Normal and Lower Extremity Strength Normal.  Lymphatic Head & Neck  General Head & Neck Lymphatics: Bilateral - Description - Normal. Axillary  General Axillary Region: Bilateral - Description - Normal. Tenderness - Non Tender. Femoral & Inguinal  Generalized Femoral & Inguinal Lymphatics: Bilateral - Description - Normal. Tenderness - Non Tender.    Assessment & Plan (Jhonny Calixto A. Ninfa Linden MD; 10/21/2018 2:00 PM)  INTUSSUSCEPTION OF SMALL BOWEL (K56.1) Impression: I have reviewed her CT scan we discussed the results. She has significant abdominal discomfort as well as a 50 pound weight loss. I am concerned that the 2 areas of intussusception may be from either benign polyps or malignancy like a carcinoid tumor. We discussed getting further x-ray studies versus a diagnostic laparoscopy. We discussed the advantages and disadvantages of each. Given her symptoms, even if any other x-ray study was unremarkable, I would recommend a diagnostic laparoscopy to completely evaluate all the small bowel and other intra-abdominal organs as I feel she has progressing to a small bowel obstruction. I discussed proceeding with a diagnostic laparoscopy and possible small bowel resection. I discussed the procedure in detail. I discussed the risks of bleeding, infection, the need for bowel resection, anastomotic issues including leak, the need for further procedures, the chances of the procedure nondiagnostic, conversion  to an open procedure, cardiopulmonary issues, postoperative recovery, etc. After a long discussion, she wishes to proceed with surgery which will be scheduled as soon as possible

## 2018-10-27 ENCOUNTER — Inpatient Hospital Stay (HOSPITAL_COMMUNITY): Payer: Medicare Other | Admitting: Anesthesiology

## 2018-10-27 ENCOUNTER — Other Ambulatory Visit: Payer: Self-pay

## 2018-10-27 ENCOUNTER — Observation Stay (HOSPITAL_COMMUNITY)
Admission: RE | Admit: 2018-10-27 | Discharge: 2018-10-28 | Disposition: A | Discharge status: Home / Self Care | Payer: Medicare Other | Attending: Surgery | Admitting: Surgery

## 2018-10-27 ENCOUNTER — Encounter (HOSPITAL_COMMUNITY)
Admission: RE | Disposition: A | Discharge status: Home / Self Care | Payer: Self-pay | Source: Home / Self Care | Attending: Surgery

## 2018-10-27 ENCOUNTER — Encounter (HOSPITAL_COMMUNITY): Payer: Self-pay | Admitting: Certified Registered Nurse Anesthetist

## 2018-10-27 DIAGNOSIS — K219 Gastro-esophageal reflux disease without esophagitis: Secondary | ICD-10-CM | POA: Diagnosis not present

## 2018-10-27 DIAGNOSIS — Z6831 Body mass index (BMI) 31.0-31.9, adult: Secondary | ICD-10-CM | POA: Insufficient documentation

## 2018-10-27 DIAGNOSIS — K561 Intussusception: Secondary | ICD-10-CM | POA: Diagnosis present

## 2018-10-27 DIAGNOSIS — R0602 Shortness of breath: Secondary | ICD-10-CM | POA: Insufficient documentation

## 2018-10-27 DIAGNOSIS — R1084 Generalized abdominal pain: Secondary | ICD-10-CM | POA: Diagnosis not present

## 2018-10-27 DIAGNOSIS — E785 Hyperlipidemia, unspecified: Secondary | ICD-10-CM | POA: Diagnosis not present

## 2018-10-27 DIAGNOSIS — M1990 Unspecified osteoarthritis, unspecified site: Secondary | ICD-10-CM | POA: Insufficient documentation

## 2018-10-27 DIAGNOSIS — Z7982 Long term (current) use of aspirin: Secondary | ICD-10-CM | POA: Insufficient documentation

## 2018-10-27 DIAGNOSIS — K5730 Diverticulosis of large intestine without perforation or abscess without bleeding: Secondary | ICD-10-CM | POA: Insufficient documentation

## 2018-10-27 DIAGNOSIS — R109 Unspecified abdominal pain: Principal | ICD-10-CM | POA: Insufficient documentation

## 2018-10-27 DIAGNOSIS — R634 Abnormal weight loss: Secondary | ICD-10-CM | POA: Diagnosis not present

## 2018-10-27 DIAGNOSIS — K5900 Constipation, unspecified: Secondary | ICD-10-CM | POA: Insufficient documentation

## 2018-10-27 DIAGNOSIS — R197 Diarrhea, unspecified: Secondary | ICD-10-CM | POA: Insufficient documentation

## 2018-10-27 DIAGNOSIS — I10 Essential (primary) hypertension: Secondary | ICD-10-CM | POA: Diagnosis not present

## 2018-10-27 DIAGNOSIS — Z7984 Long term (current) use of oral hypoglycemic drugs: Secondary | ICD-10-CM | POA: Insufficient documentation

## 2018-10-27 DIAGNOSIS — Z79899 Other long term (current) drug therapy: Secondary | ICD-10-CM | POA: Diagnosis not present

## 2018-10-27 DIAGNOSIS — E119 Type 2 diabetes mellitus without complications: Secondary | ICD-10-CM | POA: Diagnosis not present

## 2018-10-27 HISTORY — PX: LAPAROSCOPY: SHX197

## 2018-10-27 HISTORY — PX: DIAGNOSTIC LAPAROSCOPY: SUR761

## 2018-10-27 HISTORY — DX: Dyspnea, unspecified: R06.00

## 2018-10-27 LAB — CBC
HCT: 37.7 % (ref 36.0–46.0)
Hemoglobin: 12.3 g/dL (ref 12.0–15.0)
MCH: 30.9 pg (ref 26.0–34.0)
MCHC: 32.6 g/dL (ref 30.0–36.0)
MCV: 94.7 fL (ref 80.0–100.0)
Platelets: 338 10*3/uL (ref 150–400)
RBC: 3.98 MIL/uL (ref 3.87–5.11)
RDW: 17.7 % — ABNORMAL HIGH (ref 11.5–15.5)
WBC: 8 10*3/uL (ref 4.0–10.5)
nRBC: 0 % (ref 0.0–0.2)

## 2018-10-27 LAB — TYPE AND SCREEN
ABO/RH(D): O POS
Antibody Screen: NEGATIVE

## 2018-10-27 LAB — GLUCOSE, CAPILLARY: Glucose-Capillary: 125 mg/dL — ABNORMAL HIGH (ref 70–99)

## 2018-10-27 LAB — ABO/RH: ABO/RH(D): O POS

## 2018-10-27 SURGERY — LAPAROSCOPY, DIAGNOSTIC
Anesthesia: General | Site: Abdomen

## 2018-10-27 MED ORDER — ONDANSETRON HCL 4 MG/2ML IJ SOLN: 4.0000 mg | Freq: Once | INTRAMUSCULAR | Status: DC | PRN

## 2018-10-27 MED ORDER — SODIUM CHLORIDE 0.9 % IV SOLN
INTRAVENOUS | Status: DC | PRN
  Administered 2018-10-27: 13:00:00 20 ug/min via INTRAVENOUS

## 2018-10-27 MED ORDER — SUCCINYLCHOLINE CHLORIDE 200 MG/10ML IV SOSY
PREFILLED_SYRINGE | INTRAVENOUS | Status: DC | PRN
  Administered 2018-10-27: 140 mg via INTRAVENOUS

## 2018-10-27 MED ORDER — ACETAMINOPHEN 500 MG PO TABS
ORAL_TABLET | ORAL | Status: AC
  Administered 2018-10-27: 12:00:00 1000 mg via ORAL
  Filled 2018-10-27: qty 2

## 2018-10-27 MED ORDER — SUCCINYLCHOLINE CHLORIDE 200 MG/10ML IV SOSY
PREFILLED_SYRINGE | INTRAVENOUS | Status: AC
  Filled 2018-10-27: qty 10

## 2018-10-27 MED ORDER — LIDOCAINE 2% (20 MG/ML) 5 ML SYRINGE
INTRAMUSCULAR | Status: DC | PRN
  Administered 2018-10-27: 100 mg via INTRAVENOUS

## 2018-10-27 MED ORDER — ONDANSETRON 4 MG PO TBDP
4.0000 mg | ORAL_TABLET | Freq: Four times a day (QID) | ORAL | Status: DC | PRN
  Filled 2018-10-27: qty 1

## 2018-10-27 MED ORDER — LIDOCAINE 2% (20 MG/ML) 5 ML SYRINGE
INTRAMUSCULAR | Status: AC
  Filled 2018-10-27: qty 5

## 2018-10-27 MED ORDER — TRAMADOL HCL 50 MG PO TABS
50.0000 mg | ORAL_TABLET | Freq: Four times a day (QID) | ORAL | Status: DC | PRN
  Administered 2018-10-28: 11:00:00 50 mg via ORAL
  Filled 2018-10-27: qty 1

## 2018-10-27 MED ORDER — OXYCODONE HCL 5 MG PO TABS
5.0000 mg | ORAL_TABLET | ORAL | Status: DC | PRN
  Administered 2018-10-27 (×2): 5 mg via ORAL
  Filled 2018-10-27 (×2): qty 1

## 2018-10-27 MED ORDER — ALVIMOPAN 12 MG PO CAPS
12.0000 mg | ORAL_CAPSULE | ORAL | Status: AC
  Administered 2018-10-27: 12:00:00 12 mg via ORAL

## 2018-10-27 MED ORDER — ONDANSETRON HCL 4 MG/2ML IJ SOLN
INTRAMUSCULAR | Status: DC | PRN
  Administered 2018-10-27: 4 mg via INTRAVENOUS

## 2018-10-27 MED ORDER — GABAPENTIN 300 MG PO CAPS
300.0000 mg | ORAL_CAPSULE | Freq: Two times a day (BID) | ORAL | Status: DC
  Administered 2018-10-27 – 2018-10-28 (×2): 300 mg via ORAL
  Filled 2018-10-27 (×2): qty 1

## 2018-10-27 MED ORDER — MORPHINE SULFATE (PF) 2 MG/ML IV SOLN: 1.0000 mg | INTRAVENOUS | Status: DC | PRN

## 2018-10-27 MED ORDER — LACTATED RINGERS IV SOLN
INTRAVENOUS | Status: DC
  Administered 2018-10-27: 12:00:00 via INTRAVENOUS

## 2018-10-27 MED ORDER — ONDANSETRON HCL 4 MG/2ML IJ SOLN
INTRAMUSCULAR | Status: AC
  Filled 2018-10-27: qty 2

## 2018-10-27 MED ORDER — STERILE WATER FOR IRRIGATION IR SOLN
Status: DC | PRN
  Administered 2018-10-27: 1000 mL

## 2018-10-27 MED ORDER — DIPHENHYDRAMINE HCL 50 MG/ML IJ SOLN: 12.5000 mg | Freq: Four times a day (QID) | INTRAMUSCULAR | Status: DC | PRN

## 2018-10-27 MED ORDER — LISINOPRIL 40 MG PO TABS
40.0000 mg | ORAL_TABLET | Freq: Every day | ORAL | Status: DC
  Administered 2018-10-27 – 2018-10-28 (×2): 40 mg via ORAL
  Filled 2018-10-27: qty 1
  Filled 2018-10-27 (×2): qty 2
  Filled 2018-10-27: qty 1

## 2018-10-27 MED ORDER — GABAPENTIN 300 MG PO CAPS
ORAL_CAPSULE | ORAL | Status: AC
  Administered 2018-10-27: 12:00:00 300 mg via ORAL
  Filled 2018-10-27: qty 1

## 2018-10-27 MED ORDER — CHLORHEXIDINE GLUCONATE CLOTH 2 % EX PADS: 6.0000 | MEDICATED_PAD | Freq: Once | CUTANEOUS | Status: DC

## 2018-10-27 MED ORDER — DEXAMETHASONE SODIUM PHOSPHATE 10 MG/ML IJ SOLN
INTRAMUSCULAR | Status: AC
  Filled 2018-10-27: qty 1

## 2018-10-27 MED ORDER — HYDROMORPHONE HCL 1 MG/ML IJ SOLN: 0.2500 mg | INTRAMUSCULAR | Status: DC | PRN

## 2018-10-27 MED ORDER — ACETAMINOPHEN 500 MG PO TABS
1000.0000 mg | ORAL_TABLET | ORAL | Status: AC
  Administered 2018-10-27: 12:00:00 1000 mg via ORAL

## 2018-10-27 MED ORDER — CIPROFLOXACIN IN D5W 400 MG/200ML IV SOLN
INTRAVENOUS | Status: AC
  Filled 2018-10-27: qty 200

## 2018-10-27 MED ORDER — PROPOFOL 10 MG/ML IV BOLUS
INTRAVENOUS | Status: DC | PRN
  Administered 2018-10-27: 150 mg via INTRAVENOUS
  Administered 2018-10-27: 30 mg via INTRAVENOUS

## 2018-10-27 MED ORDER — PHENYLEPHRINE 40 MCG/ML (10ML) SYRINGE FOR IV PUSH (FOR BLOOD PRESSURE SUPPORT)
PREFILLED_SYRINGE | INTRAVENOUS | Status: AC
  Filled 2018-10-27: qty 10

## 2018-10-27 MED ORDER — ENOXAPARIN SODIUM 40 MG/0.4ML ~~LOC~~ SOLN
40.0000 mg | SUBCUTANEOUS | Status: DC
  Administered 2018-10-28: 40 mg via SUBCUTANEOUS
  Filled 2018-10-27: qty 0.4

## 2018-10-27 MED ORDER — POTASSIUM CHLORIDE IN NACL 20-0.9 MEQ/L-% IV SOLN: INTRAVENOUS | Status: DC

## 2018-10-27 MED ORDER — DIPHENHYDRAMINE HCL 12.5 MG/5ML PO ELIX
12.5000 mg | ORAL_SOLUTION | Freq: Four times a day (QID) | ORAL | Status: DC | PRN
  Filled 2018-10-27: qty 5

## 2018-10-27 MED ORDER — CELECOXIB 200 MG PO CAPS
200.0000 mg | ORAL_CAPSULE | Freq: Two times a day (BID) | ORAL | Status: DC
  Administered 2018-10-27 – 2018-10-28 (×2): 200 mg via ORAL
  Filled 2018-10-27 (×2): qty 1

## 2018-10-27 MED ORDER — GABAPENTIN 300 MG PO CAPS
300.0000 mg | ORAL_CAPSULE | ORAL | Status: AC
  Administered 2018-10-27: 12:00:00 300 mg via ORAL

## 2018-10-27 MED ORDER — FENTANYL CITRATE (PF) 250 MCG/5ML IJ SOLN
INTRAMUSCULAR | Status: DC | PRN
  Administered 2018-10-27 (×5): 50 ug via INTRAVENOUS

## 2018-10-27 MED ORDER — DEXAMETHASONE SODIUM PHOSPHATE 10 MG/ML IJ SOLN
INTRAMUSCULAR | Status: DC | PRN
  Administered 2018-10-27: 10 mg via INTRAVENOUS

## 2018-10-27 MED ORDER — BUPIVACAINE-EPINEPHRINE (PF) 0.25% -1:200000 IJ SOLN
INTRAMUSCULAR | Status: AC
  Filled 2018-10-27: qty 30

## 2018-10-27 MED ORDER — FENTANYL CITRATE (PF) 250 MCG/5ML IJ SOLN
INTRAMUSCULAR | Status: AC
  Filled 2018-10-27: qty 5

## 2018-10-27 MED ORDER — ROCURONIUM BROMIDE 10 MG/ML (PF) SYRINGE
PREFILLED_SYRINGE | INTRAVENOUS | Status: AC
  Filled 2018-10-27: qty 10

## 2018-10-27 MED ORDER — ROCURONIUM BROMIDE 10 MG/ML (PF) SYRINGE
PREFILLED_SYRINGE | INTRAVENOUS | Status: DC | PRN
  Administered 2018-10-27: 50 mg via INTRAVENOUS

## 2018-10-27 MED ORDER — CIPROFLOXACIN IN D5W 400 MG/200ML IV SOLN
400.0000 mg | INTRAVENOUS | Status: AC
  Administered 2018-10-27: 13:00:00 400 mg via INTRAVENOUS

## 2018-10-27 MED ORDER — 0.9 % SODIUM CHLORIDE (POUR BTL) OPTIME
TOPICAL | Status: DC | PRN
  Administered 2018-10-27: 13:00:00 1000 mL

## 2018-10-27 MED ORDER — PROPOFOL 10 MG/ML IV BOLUS
INTRAVENOUS | Status: AC
  Filled 2018-10-27: qty 20

## 2018-10-27 MED ORDER — MEPERIDINE HCL 25 MG/ML IJ SOLN: 6.2500 mg | INTRAMUSCULAR | Status: DC | PRN

## 2018-10-27 MED ORDER — BUPIVACAINE-EPINEPHRINE 0.25% -1:200000 IJ SOLN
INTRAMUSCULAR | Status: DC | PRN
  Administered 2018-10-27: 20 mL

## 2018-10-27 MED ORDER — LEVOTHYROXINE SODIUM 125 MCG PO TABS
125.0000 ug | ORAL_TABLET | Freq: Every day | ORAL | Status: DC
  Administered 2018-10-28: 125 ug via ORAL
  Filled 2018-10-27: qty 1

## 2018-10-27 MED ORDER — ALVIMOPAN 12 MG PO CAPS
ORAL_CAPSULE | ORAL | Status: AC
  Administered 2018-10-27: 12 mg via ORAL
  Filled 2018-10-27: qty 1

## 2018-10-27 MED ORDER — ONDANSETRON HCL 4 MG/2ML IJ SOLN: 4.0000 mg | Freq: Four times a day (QID) | INTRAMUSCULAR | Status: DC | PRN

## 2018-10-27 MED ORDER — SUGAMMADEX SODIUM 200 MG/2ML IV SOLN
INTRAVENOUS | Status: DC | PRN
  Administered 2018-10-27: 200 mg via INTRAVENOUS

## 2018-10-27 SURGICAL SUPPLY — 30 items
BLADE SURG 10 STRL SS (BLADE) ×1 IMPLANT
CANISTER SUCT 3000ML PPV (MISCELLANEOUS) IMPLANT
CHLORAPREP W/TINT 26 (MISCELLANEOUS) ×3 IMPLANT
COVER SURGICAL LIGHT HANDLE (MISCELLANEOUS) ×3 IMPLANT
DECANTER SPIKE VIAL GLASS SM (MISCELLANEOUS) ×3 IMPLANT
DERMABOND ADVANCED (GAUZE/BANDAGES/DRESSINGS) ×1
DERMABOND ADVANCED .7 DNX12 (GAUZE/BANDAGES/DRESSINGS) ×2 IMPLANT
ELECT REM PT RETURN 9FT ADLT (ELECTROSURGICAL) ×3
ELECTRODE REM PT RTRN 9FT ADLT (ELECTROSURGICAL) ×2 IMPLANT
GLOVE BIO SURGEON STRL SZ7.5 (GLOVE) ×1 IMPLANT
GLOVE BIOGEL PI IND STRL 6.5 (GLOVE) IMPLANT
GLOVE BIOGEL PI INDICATOR 6.5 (GLOVE) ×2
GLOVE SURG SIGNA 7.5 PF LTX (GLOVE) ×3 IMPLANT
GLOVE SURG SS PI 6.0 STRL IVOR (GLOVE) ×1 IMPLANT
GOWN STRL REUS W/ TWL LRG LVL3 (GOWN DISPOSABLE) ×4 IMPLANT
GOWN STRL REUS W/ TWL XL LVL3 (GOWN DISPOSABLE) ×2 IMPLANT
GOWN STRL REUS W/TWL LRG LVL3 (GOWN DISPOSABLE) ×2
GOWN STRL REUS W/TWL XL LVL3 (GOWN DISPOSABLE) ×2
KIT BASIN OR (CUSTOM PROCEDURE TRAY) ×3 IMPLANT
KIT TURNOVER KIT B (KITS) ×3 IMPLANT
NS IRRIG 1000ML POUR BTL (IV SOLUTION) ×3 IMPLANT
PAD ARMBOARD 7.5X6 YLW CONV (MISCELLANEOUS) ×3 IMPLANT
SET IRRIG TUBING LAPAROSCOPIC (IRRIGATION / IRRIGATOR) IMPLANT
SET TUBE SMOKE EVAC HIGH FLOW (TUBING) ×3 IMPLANT
SLEEVE ENDOPATH XCEL 5M (ENDOMECHANICALS) ×3 IMPLANT
SUT MON AB 4-0 PC3 18 (SUTURE) ×3 IMPLANT
TOWEL GREEN STERILE (TOWEL DISPOSABLE) ×3 IMPLANT
TRAY LAPAROSCOPIC MC (CUSTOM PROCEDURE TRAY) ×3 IMPLANT
TROCAR XCEL BLUNT TIP 100MML (ENDOMECHANICALS) ×1 IMPLANT
TROCAR XCEL NON-BLD 5MMX100MML (ENDOMECHANICALS) ×3 IMPLANT

## 2018-10-27 NOTE — Transfer of Care (Signed)
Immediate Anesthesia Transfer of Care Note  Patient: Michele Dawson  Procedure(s) Performed: DIAGNOSTIC LAPAROSCOPCY (N/A Abdomen)  Patient Location: PACU  Anesthesia Type:General  Level of Consciousness: awake, alert  and oriented  Airway & Oxygen Therapy: Patient Spontanous Breathing  Post-op Assessment: Report given to RN, Post -op Vital signs reviewed and stable and Patient moving all extremities X 4  Post vital signs: Reviewed and stable  Last Vitals:  Vitals Value Taken Time  BP 126/74 10/27/18 1339  Temp    Pulse 84 10/27/18 1341  Resp 24 10/27/18 1341  SpO2 94 % 10/27/18 1341  Vitals shown include unvalidated device data.  Last Pain:  Vitals:   10/27/18 1141  TempSrc:   PainSc: 3       Patients Stated Pain Goal: 2 ([id number])  Complications: No apparent anesthesia complications

## 2018-10-27 NOTE — Anesthesia Procedure Notes (Signed)
Procedure Name: Intubation Date/Time: 10/27/2018 12:48 PM Performed by: Harden Mo, CRNA Pre-anesthesia Checklist: Patient identified, Emergency Drugs available, Suction available and Patient being monitored Patient Re-evaluated:Patient Re-evaluated prior to induction Oxygen Delivery Method: Circle System Utilized Preoxygenation: Pre-oxygenation with 100% oxygen Induction Type: IV induction, Rapid sequence and Cricoid Pressure applied Laryngoscope Size: Miller and 2 Grade View: Grade I Tube type: Oral Tube size: 7.5 mm Number of attempts: 1 Airway Equipment and Method: Stylet and Oral airway Placement Confirmation: ETT inserted through vocal cords under direct vision,  positive ETCO2 and breath sounds checked- equal and bilateral Secured at: 22 cm Tube secured with: Tape Dental Injury: Teeth and Oropharynx as per pre-operative assessment

## 2018-10-27 NOTE — Op Note (Signed)
DIAGNOSTIC LAPAROSCOPCY  Procedure Note  Michele Dawson 10/27/2018   Pre-op Diagnosis: SMALL BOWEL INTUSSUSECPTION     Post-op Diagnosis: ABDOMINAL PAIN UNCERTAIN ETIOLOGY  Procedure(s): DIAGNOSTIC LAPAROSCOPCY  Surgeon(s): Coralie Keens, MD Ralene Ok, MD  Anesthesia: General  Staff:  Circulator: Rozell Searing, RN; Celene Squibb, RN Scrub Person: Ouida Sills, Timothy V, CST  Estimated Blood Loss: Minimal               Indications: This is a 78 year old female who presents with an 87-month history of left-sided abdominal pain, a 50 pound weight loss, and food aversion secondary to her discomfort.  She has been seen by gastroenterology.  She had a normal CT scan in February 2020.  She had another CT CT scan over a week ago showing 2 areas of small bowel intussusception in the left abdomen.  After long discussion, the decision was made to proceed with a diagnostic laparoscopy to see if an etiology of her discomfort could be found.  Her CT scan was otherwise unremarkable.  Findings: No small bowel abnormality was identified.  The patient had diverticulosis of the sigmoid colon without diverticulitis.  The rest of her colon appeared normal as well.  Her gallbladder appeared normal.  The liver was normal.  The visible stomach was normal.  No etiology of her discomfort or weight loss could be identified.  Procedure: The patient was brought to the operating room and identifies correct patient.  She was placed upon the operating table and general anesthesia was induced.  Her abdomen was then prepped and draped in usual sterile fashion.  I made a small incision below the umbilicus with a scalpel.  I took this down to the fascia which was then opened with a scalpel.  Hemostat was then used to pass to the peritoneal cavity under direct vision.  A 0 Vicryl pursestring suture was placed around the fascial opening.  The [facility] port was placed the opening and  insufflation of the abdomen was begun.  I placed a 5 mm trocar in the patient's lower midline and another one in the upper midline both under direct vision.  There was no gross carcinomatosis identified.  I was able to easily run the small bowel from the ligament of Treitz to the cecum and saw no abnormalities, masses, or areas of intussusception.  The appendix and cecum appeared normal as well.  There was diverticulosis of the sigmoid colon but no diverticulitis.  The entire colon was soft and otherwise normal in appearance.  There was a small amount of omentum adherent to the abdominal wall which was otherwise normal.  The gallbladder, liver, and visible stomach also appeared normal.  At this point, again, no obvious source of her abdominal pain or weight loss could be identified.  The decision was made to terminate the procedure.  All ports were removed under direct vision the abdomen was deflated.  The 0 Vicryl was tied in place closing the fascial defect.  All incisions were then anesthetized Marcaine and closed with 4-0 Monocryl sutures.  Dermabond was then applied.  The patient tolerated procedure well.  All the counts were correct at the end of the procedure.  The patient was then extubated in the operating room and taken in a stable condition to the recovery room.          Coralie Keens   Date: 10/27/2018  Time: 1:30 PM

## 2018-10-27 NOTE — Anesthesia Preprocedure Evaluation (Signed)
Anesthesia Evaluation  Patient identified by MRN, date of birth, ID band Patient awake    Reviewed: Allergy & Precautions, NPO status , Patient's Chart, lab work & pertinent test results  Airway Mallampati: II  TM Distance: >3 FB Neck ROM: Full    Dental  (+) Dental Advisory Given   Pulmonary shortness of breath,    Pulmonary exam normal breath sounds clear to auscultation       Cardiovascular hypertension, Pt. on medications Normal cardiovascular exam Rhythm:Regular Rate:Normal     Neuro/Psych negative neurological ROS  negative psych ROS   GI/Hepatic Neg liver ROS, GERD  ,  Endo/Other  negative endocrine ROSdiabetes  Renal/GU negative Renal ROS     Musculoskeletal  (+) Arthritis ,   Abdominal   Peds  Hematology negative hematology ROS (+)   Anesthesia Other Findings   Reproductive/Obstetrics negative OB ROS                             Anesthesia Physical Anesthesia Plan  ASA: II  Anesthesia Plan: General   Post-op Pain Management:    Induction: Intravenous, Rapid sequence and Cricoid pressure planned  PONV Risk Score and Plan: 4 or greater and Ondansetron, Dexamethasone and Treatment may vary due to age or medical condition  Airway Management Planned: Oral ETT  Additional Equipment:   Intra-op Plan:   Post-operative Plan: Extubation in OR  Informed Consent: I have reviewed the patients History and Physical, chart, labs and discussed the procedure including the risks, benefits and alternatives for the proposed anesthesia with the patient or authorized representative who has indicated his/her understanding and acceptance.     Dental advisory given  Plan Discussed with: CRNA  Anesthesia Plan Comments:         Anesthesia Quick Evaluation

## 2018-10-27 NOTE — Interval H&P Note (Signed)
History and Physical Interval Note: no change in H and P  10/27/2018 12:07 PM  Michele Dawson  has presented today for surgery, with the diagnosis of [name].  The various methods of treatment have been discussed with the patient and family. After consideration of risks, benefits and other options for treatment, the patient has consented to  Procedure(s): DIAGNOSTIC LAPAROSCOPCY (N/A) LAPAROSCOPIC VERSUS OPEN POSSIBLE SMALL BOWEL RESECTION (N/A) as a surgical intervention.  The patient's history has been reviewed, patient examined, no change in status, stable for surgery.  I have reviewed the patient's chart and labs.  Questions were answered to the patient's satisfaction.     Coralie Keens

## 2018-10-28 ENCOUNTER — Encounter (HOSPITAL_COMMUNITY): Payer: Self-pay | Admitting: Surgery

## 2018-10-28 DIAGNOSIS — R0602 Shortness of breath: Secondary | ICD-10-CM | POA: Diagnosis not present

## 2018-10-28 DIAGNOSIS — I10 Essential (primary) hypertension: Secondary | ICD-10-CM | POA: Diagnosis not present

## 2018-10-28 DIAGNOSIS — R634 Abnormal weight loss: Secondary | ICD-10-CM | POA: Diagnosis not present

## 2018-10-28 DIAGNOSIS — R109 Unspecified abdominal pain: Secondary | ICD-10-CM | POA: Diagnosis not present

## 2018-10-28 DIAGNOSIS — K5730 Diverticulosis of large intestine without perforation or abscess without bleeding: Secondary | ICD-10-CM | POA: Diagnosis not present

## 2018-10-28 DIAGNOSIS — Z6831 Body mass index (BMI) 31.0-31.9, adult: Secondary | ICD-10-CM | POA: Diagnosis not present

## 2018-10-28 MED ORDER — TRAMADOL HCL 50 MG PO TABS: 50.0000 mg | ORAL_TABLET | Freq: Four times a day (QID) | ORAL | 0 refills | Status: DC | PRN

## 2018-10-28 NOTE — Progress Notes (Signed)
Patient alert and oriented, mae's well, voiding adequate amount of urine, swallowing without difficulty, no c/o pain at time of discharge. Patient discharged home with family. Script and discharged instructions given to patient. Patient and family stated understanding of instructions given. Patient has an appointment with Dr. Blackman   

## 2018-10-28 NOTE — Discharge Instructions (Signed)
[facility] ______[facility] LAPAROSCOPIC SURGERY: POST OP INSTRUCTIONS Always review your discharge instruction sheet given to you by the facility where your surgery was performed. IF YOU HAVE DISABILITY OR FAMILY LEAVE FORMS, YOU MUST BRING THEM TO THE OFFICE FOR PROCESSING.   DO NOT GIVE THEM TO YOUR DOCTOR.  1. A prescription for pain medication may be given to you upon discharge.  Take your pain medication as prescribed, if needed.  If narcotic pain medicine is not needed, then you may take acetaminophen (Tylenol) or ibuprofen (Advil) as needed. 2. Take your usually prescribed medications unless otherwise directed. 3. If you need a refill on your pain medication, please contact your pharmacy.  They will contact our office to request authorization. Prescriptions will not be filled after 5pm or on week-ends. 4. You should follow a light diet the first few days after arrival home, such as soup and crackers, etc.  Be sure to include lots of fluids daily. 5. Most patients will experience some swelling and bruising in the area of the incisions.  Ice packs will help.  Swelling and bruising can take several days to resolve.  6. It is common to experience some constipation if taking pain medication after surgery.  Increasing fluid intake and taking a stool softener (such as Colace) will usually help or prevent this problem from occurring.  A mild laxative (Milk of Magnesia or Miralax) should be taken according to package instructions if there are no bowel movements after 48 hours. 7. Unless discharge instructions indicate otherwise, you may remove your bandages 24-48 hours after surgery, and you may shower at that time.  You may have steri-strips (small skin tapes) in place directly over the incision.  These strips should be left on the skin for 7-10 days.  If your surgeon used skin glue on the incision, you may shower in 24 hours.  The glue will flake off over the next 2-3 weeks.  Any sutures or  staples will be removed at the office during your follow-up visit. 8. ACTIVITIES:  You may resume regular (light) daily activities beginning the next day--such as daily self-care, walking, climbing stairs--gradually increasing activities as tolerated.  You may have sexual intercourse when it is comfortable.  Refrain from any heavy lifting or straining until approved by your doctor. a. You may drive when you are no longer taking prescription pain medication, you can comfortably wear a seatbelt, and you can safely maneuver your car and apply brakes. b. RETURN TO WORK:  __________________________________________________________ 9. You should see your doctor in the office for a follow-up appointment approximately 2-3 weeks after your surgery.  Make sure that you call for this appointment within a day or two after you arrive home to insure a convenient appointment time. 10. OTHER INSTRUCTIONS:ok to shower starting today 11. No lifting more than 15 to 20 pounds for 2 weeks __________________________________________________________________________________________________________________________ __________________________________________________________________________________________________________________________ WHEN TO CALL YOUR DOCTOR: 1. Fever over 101.0 2. Inability to urinate 3. Continued bleeding from incision. 4. Increased pain, redness, or drainage from the incision. 5. Increasing abdominal pain  The clinic staff is available to answer your questions during regular business hours.  Please dont hesitate to call and ask to speak to one of the nurses for clinical concerns.  If you have a medical emergency, go to the nearest emergency room or call 911.  A surgeon from [facility] is always on call at the hospital. [address], [place], [place], [place]  [address] ? [address]  [address] [place], [place]   [address] [phone] ? [phone] ? [phone] Web site:  [email or web address]

## 2018-10-28 NOTE — Anesthesia Postprocedure Evaluation (Signed)
Anesthesia Post Note  Patient: Michele Dawson  Procedure(s) Performed: DIAGNOSTIC LAPAROSCOPCY (N/A Abdomen)     Patient location during evaluation: PACU Anesthesia Type: General Level of consciousness: sedated and patient cooperative Pain management: pain level controlled Vital Signs Assessment: post-procedure vital signs reviewed and stable Respiratory status: spontaneous breathing Cardiovascular status: stable Anesthetic complications: no    Last Vitals:  Vitals:   10/28/18 0404 10/28/18 0713  BP: 120/64 123/71  Pulse: 66 65  Resp: 19 16  Temp: (!) 36.4 C (!) 36.4 C  SpO2: 94% 99%    Last Pain:  Vitals:   10/28/18 0713  TempSrc: Oral  PainSc:                  Nolon Nations

## 2018-10-28 NOTE — Discharge Summary (Signed)
Physician Discharge Summary  Patient ID: Michele Dawson MRN: [id number] DOB/AGE: 1940-09-21 78 y.o.  Admit date: 10/27/2018 Discharge date: 10/28/2018  Admission Diagnoses:  Discharge Diagnoses:  Active Problems:   Small bowel intussusception [facility])   Discharged Condition: good  Hospital Course: uneventful post op recovery.  Discharged home POD#1  Consults: None  Significant Diagnostic Studies:   Treatments: surgery: diagnostic laparoscopy  Discharge Exam: Blood pressure 123/71, pulse 65, temperature (!) 97.5 F (36.4 C), temperature source Oral, resp. rate 16, height 5\' 5"  (1.651 m), weight 85.3 kg, SpO2 99 %. General appearance: alert, cooperative and no distress Resp: clear to auscultation bilaterally Cardio: regular rate and rhythm, S1, S2 normal, no murmur, click, rub or gallop Incision/Wound:abdomen soft, incisions clean  Disposition: Discharge disposition: 01-Home or Self Care        Allergies as of 10/28/2018      Reactions   Shellfish Allergy Hives   Penicillins Rash   Did it involve swelling of the face/tongue/throat, SOB, or low BP? No Did it involve sudden or severe rash/hives, skin peeling, or any reaction on the inside of your mouth or nose? No Did you need to seek medical attention at a hospital or doctor's office? Yes When did it last happen?78 yo If all above answers are "NO", may proceed with cephalosporin use.      Medication List    TAKE these medications   acetaminophen 500 MG tablet Commonly known as: TYLENOL Take 500-1,000 mg by mouth daily as needed for moderate pain or headache.   aspirin 81 MG tablet Take 81 mg by mouth daily.   atorvastatin 80 MG tablet Commonly known as: LIPITOR Take 80 mg by mouth daily.   B-12 5000 MCG Caps Take 5,000 mcg by mouth daily.   diphenhydramine-acetaminophen 25-500 MG Tabs tablet Commonly known as: TYLENOL PM Take 2 tablets by mouth at bedtime.   fluticasone 50 MCG/ACT nasal  spray Commonly known as: FLONASE Place 1 spray into both nostrils daily as needed for allergies or rhinitis.   levothyroxine 125 MCG tablet Commonly known as: SYNTHROID Take 125 mcg by mouth daily.   lisinopril 40 MG tablet Commonly known as: ZESTRIL Take 40 mg by mouth daily.   meclizine 25 MG tablet Commonly known as: ANTIVERT Take 1 every 8 hours if you have dizziness that involves the room spinning around.  Do not take the medicine if you just feel lightheaded   metFORMIN 500 MG tablet Commonly known as: GLUCOPHAGE Take 500 mg by mouth daily.   traMADol 50 MG tablet Commonly known as: ULTRAM Take 1 tablet (50 mg total) by mouth every 6 (six) hours as needed for moderate pain or severe pain.   ULTRAFLORA IMMUNE HEALTH PO Take 1 capsule by mouth daily.   Vitamin D3 25 MCG (1000 UT) Caps Take 1,000 Units by mouth daily.      Follow-up Information    Coralie Keens, MD. Schedule an appointment as soon as possible for a visit in 2 week(s).   Specialty: General Surgery Contact information: [address] [place] [address] [phone]        Juanita Craver, MD Follow up in 4 week(s).   Specialty: Gastroenterology Why: call her office to make an appointment Contact information: [address], [place] [address] [id number]           Signed: Coralie Keens 10/28/2018, 8:22 AM

## 2018-10-28 NOTE — Progress Notes (Signed)
Patient ID: Michele Dawson, female   DOB: [birth year], 78 y.o.   MRN: [id number]  No complaints Doing well Abdomen soft  S/p diagnostic laparoscopy  D/c home after lunch

## 2018-11-11 ENCOUNTER — Emergency Department (HOSPITAL_COMMUNITY): Payer: Medicare Other

## 2018-11-11 ENCOUNTER — Inpatient Hospital Stay (HOSPITAL_COMMUNITY)
Admission: EM | Admit: 2018-11-11 | Discharge: 2018-11-14 | DRG: 378 | Disposition: A | Discharge status: Home / Self Care | Payer: Medicare Other | Attending: Internal Medicine | Admitting: Internal Medicine

## 2018-11-11 ENCOUNTER — Other Ambulatory Visit: Payer: Self-pay

## 2018-11-11 DIAGNOSIS — Z7951 Long term (current) use of inhaled steroids: Secondary | ICD-10-CM

## 2018-11-11 DIAGNOSIS — D62 Acute posthemorrhagic anemia: Secondary | ICD-10-CM | POA: Diagnosis not present

## 2018-11-11 DIAGNOSIS — Z6828 Body mass index (BMI) 28.0-28.9, adult: Secondary | ICD-10-CM

## 2018-11-11 DIAGNOSIS — I10 Essential (primary) hypertension: Secondary | ICD-10-CM | POA: Diagnosis present

## 2018-11-11 DIAGNOSIS — Z86010 Personal history of colonic polyps: Secondary | ICD-10-CM | POA: Diagnosis not present

## 2018-11-11 DIAGNOSIS — E119 Type 2 diabetes mellitus without complications: Secondary | ICD-10-CM

## 2018-11-11 DIAGNOSIS — S92355A Nondisplaced fracture of fifth metatarsal bone, left foot, initial encounter for closed fracture: Secondary | ICD-10-CM

## 2018-11-11 DIAGNOSIS — Z96652 Presence of left artificial knee joint: Secondary | ICD-10-CM | POA: Diagnosis present

## 2018-11-11 DIAGNOSIS — Z91013 Allergy to seafood: Secondary | ICD-10-CM

## 2018-11-11 DIAGNOSIS — K5731 Diverticulosis of large intestine without perforation or abscess with bleeding: Secondary | ICD-10-CM | POA: Diagnosis not present

## 2018-11-11 DIAGNOSIS — R110 Nausea: Secondary | ICD-10-CM | POA: Diagnosis not present

## 2018-11-11 DIAGNOSIS — E039 Hypothyroidism, unspecified: Secondary | ICD-10-CM | POA: Diagnosis present

## 2018-11-11 DIAGNOSIS — Z8249 Family history of ischemic heart disease and other diseases of the circulatory system: Secondary | ICD-10-CM

## 2018-11-11 DIAGNOSIS — W010XXA Fall on same level from slipping, tripping and stumbling without subsequent striking against object, initial encounter: Secondary | ICD-10-CM | POA: Diagnosis present

## 2018-11-11 DIAGNOSIS — K625 Hemorrhage of anus and rectum: Secondary | ICD-10-CM | POA: Diagnosis not present

## 2018-11-11 DIAGNOSIS — Z20828 Contact with and (suspected) exposure to other viral communicable diseases: Secondary | ICD-10-CM | POA: Diagnosis not present

## 2018-11-11 DIAGNOSIS — Z79891 Long term (current) use of opiate analgesic: Secondary | ICD-10-CM

## 2018-11-11 DIAGNOSIS — D7389 Other diseases of spleen: Secondary | ICD-10-CM | POA: Diagnosis not present

## 2018-11-11 DIAGNOSIS — E785 Hyperlipidemia, unspecified: Secondary | ICD-10-CM | POA: Diagnosis present

## 2018-11-11 DIAGNOSIS — Z800 Family history of malignant neoplasm of digestive organs: Secondary | ICD-10-CM

## 2018-11-11 DIAGNOSIS — E669 Obesity, unspecified: Secondary | ICD-10-CM | POA: Diagnosis present

## 2018-11-11 DIAGNOSIS — S92352A Displaced fracture of fifth metatarsal bone, left foot, initial encounter for closed fracture: Secondary | ICD-10-CM | POA: Diagnosis not present

## 2018-11-11 DIAGNOSIS — I959 Hypotension, unspecified: Secondary | ICD-10-CM | POA: Diagnosis present

## 2018-11-11 DIAGNOSIS — Z79890 Hormone replacement therapy (postmenopausal): Secondary | ICD-10-CM

## 2018-11-11 DIAGNOSIS — K219 Gastro-esophageal reflux disease without esophagitis: Secondary | ICD-10-CM | POA: Diagnosis present

## 2018-11-11 DIAGNOSIS — Z90710 Acquired absence of both cervix and uterus: Secondary | ICD-10-CM

## 2018-11-11 DIAGNOSIS — Z880 Allergy status to penicillin: Secondary | ICD-10-CM

## 2018-11-11 DIAGNOSIS — S92912A Unspecified fracture of left toe(s), initial encounter for closed fracture: Secondary | ICD-10-CM | POA: Diagnosis present

## 2018-11-11 DIAGNOSIS — K5730 Diverticulosis of large intestine without perforation or abscess without bleeding: Secondary | ICD-10-CM | POA: Diagnosis not present

## 2018-11-11 DIAGNOSIS — Z03818 Encounter for observation for suspected exposure to other biological agents ruled out: Secondary | ICD-10-CM | POA: Diagnosis not present

## 2018-11-11 DIAGNOSIS — Z7984 Long term (current) use of oral hypoglycemic drugs: Secondary | ICD-10-CM

## 2018-11-11 DIAGNOSIS — Z803 Family history of malignant neoplasm of breast: Secondary | ICD-10-CM

## 2018-11-11 DIAGNOSIS — Z7982 Long term (current) use of aspirin: Secondary | ICD-10-CM

## 2018-11-11 DIAGNOSIS — Z79899 Other long term (current) drug therapy: Secondary | ICD-10-CM

## 2018-11-11 LAB — POC OCCULT BLOOD, ED: Fecal Occult Bld: POSITIVE — AB

## 2018-11-11 LAB — CBC
HCT: 36.4 % (ref 36.0–46.0)
Hemoglobin: 11.4 g/dL — ABNORMAL LOW (ref 12.0–15.0)
MCH: 30.6 pg (ref 26.0–34.0)
MCHC: 31.3 g/dL (ref 30.0–36.0)
MCV: 97.6 fL (ref 80.0–100.0)
Platelets: 276 10*3/uL (ref 150–400)
RBC: 3.73 MIL/uL — ABNORMAL LOW (ref 3.87–5.11)
RDW: 16 % — ABNORMAL HIGH (ref 11.5–15.5)
WBC: 8.5 10*3/uL (ref 4.0–10.5)
nRBC: 0 % (ref 0.0–0.2)

## 2018-11-11 LAB — COMPREHENSIVE METABOLIC PANEL
ALT: 16 U/L (ref 0–44)
AST: 27 U/L (ref 15–41)
Albumin: 3.8 g/dL (ref 3.5–5.0)
Alkaline Phosphatase: 71 U/L (ref 38–126)
Anion gap: 12 (ref 5–15)
BUN: 17 mg/dL (ref 8–23)
CO2: 21 mmol/L — ABNORMAL LOW (ref 22–32)
Calcium: 9.7 mg/dL (ref 8.9–10.3)
Chloride: 105 mmol/L (ref 98–111)
Creatinine, Ser: 0.74 mg/dL (ref 0.44–1.00)
GFR calc Af Amer: >60 mL/min (ref >60)
GFR calc non Af Amer: >60 mL/min (ref >60)
Glucose, Bld: 148 mg/dL — ABNORMAL HIGH (ref 70–99)
Potassium: 3.9 mmol/L (ref 3.5–5.1)
Sodium: 138 mmol/L (ref 135–145)
Total Bilirubin: 0.7 mg/dL (ref 0.3–1.2)
Total Protein: 7.2 g/dL (ref 6.5–8.1)

## 2018-11-11 LAB — TROPONIN I (HIGH SENSITIVITY): Troponin I (High Sensitivity): 10 ng/L (ref <18)

## 2018-11-11 MED ORDER — FENTANYL CITRATE (PF) 100 MCG/2ML IJ SOLN
25.0000 ug | Freq: Once | INTRAMUSCULAR | Status: AC
  Administered 2018-11-11: 25 ug via INTRAVENOUS
  Filled 2018-11-11: qty 2

## 2018-11-11 NOTE — ED Triage Notes (Signed)
Pt in c/o rectal bleeding onset today and saw GI MD, pt went home and had another more significant bleeding episode and came here, denies LOC, denies SOB, does taking blood thinners, pt reports bright red blood, pt A&O x4

## 2018-11-11 NOTE — ED Provider Notes (Signed)
Assumed care from [facility] at shift change.  See prior notes for full H&P.  Briefly, 78 y.o. F here with rectal bleeding, bright red in color, along with some LLQ pain.  Has history of diverticulosis.  Did have a fall in the [place] here while trying to throw away some trash.  Hemoccult + here, hemoglobin stable at 11.4.  GI has been consulted, they will follow along.  Plan:  CT pending, admission.  Results for orders placed or performed during the hospital encounter of 11/11/18  Comprehensive metabolic panel  Result Value Ref Range   Sodium 138 135 - 145 mmol/L   Potassium 3.9 3.5 - 5.1 mmol/L   Chloride 105 98 - 111 mmol/L   CO2 21 (L) 22 - 32 mmol/L   Glucose, Bld 148 (H) 70 - 99 mg/dL   BUN 17 8 - 23 mg/dL   Creatinine, Ser 0.74 0.44 - 1.00 mg/dL   Calcium 9.7 8.9 - 10.3 mg/dL   Total Protein 7.2 6.5 - 8.1 g/dL   Albumin 3.8 3.5 - 5.0 g/dL   AST 27 15 - 41 U/L   ALT 16 0 - 44 U/L   Alkaline Phosphatase 71 38 - 126 U/L   Total Bilirubin 0.7 0.3 - 1.2 mg/dL   GFR calc non Af Amer >60 >60 mL/min   GFR calc Af Amer >60 >60 mL/min   Anion gap 12 5 - 15  CBC  Result Value Ref Range   WBC 8.5 4.0 - 10.5 K/uL   RBC 3.73 (L) 3.87 - 5.11 MIL/uL   Hemoglobin 11.4 (L) 12.0 - 15.0 g/dL   HCT 36.4 36.0 - 46.0 %   MCV 97.6 80.0 - 100.0 fL   MCH 30.6 26.0 - 34.0 pg   MCHC 31.3 30.0 - 36.0 g/dL   RDW 16.0 (H) 11.5 - 15.5 %   Platelets 276 150 - 400 K/uL   nRBC 0.0 0.0 - 0.2 %  POC occult blood, ED  Result Value Ref Range   Fecal Occult Bld POSITIVE (A) NEGATIVE  Type and screen [place]  Result Value Ref Range   ABO/RH(D) O POS    Antibody Screen NEG    Sample Expiration      11/14/2018,2359 Performed at [facility], [address], [place], [place] [address]   Troponin I (High Sensitivity)  Result Value Ref Range   Troponin I (High Sensitivity) 10 <18 ng/L   Dg Ankle 2 Views Left  Result Date: 11/11/2018 CLINICAL DATA:  78 year old female status post  fall. Dizziness. Pain and swelling. EXAM: LEFT ANKLE - 2 VIEW COMPARISON:  Left foot series reported separately. FINDINGS: Preserved mortise joint alignment. Bone mineralization is within normal limits. Talar dome intact. No evidence of joint effusion. Distal tibia, fibula and calcaneus appear intact. Calcaneus and midfoot degenerative spurring again noted. Fracture at the base of the left 5th metatarsal redemonstrated. Mild vascular calcifications in the distal left leg. IMPRESSION: 1. No acute fracture or dislocation identified about the left ankle. 2. Fracture at the base of the left 5th metatarsal re-demonstrated. Electronically Signed   By: Genevie Ann M.D.   On: 11/11/2018 22:11   Ct Abdomen Pelvis W Contrast  Result Date: 11/12/2018 CLINICAL DATA:  Abdominal pain and rectal bleeding EXAM: CT ABDOMEN AND PELVIS WITH CONTRAST TECHNIQUE: Multidetector CT imaging of the abdomen and pelvis was performed using the standard protocol following bolus administration of intravenous contrast. CONTRAST:  183mL OMNIPAQUE IOHEXOL 300 MG/ML  SOLN COMPARISON:  10/14/2018 FINDINGS: Lower chest: No acute abnormality. Hepatobiliary: Fatty infiltration of the liver is noted. No focal mass is seen. The gallbladder is well distended without focal abnormality. Pancreas: Pancreas is within normal limits. Spleen: Spleen demonstrates multiple calcified granulomas. Adrenals/Urinary Tract: Adrenal glands are unremarkable. Left kidney is within normal limits. Right kidney demonstrates a large cyst in the upper pole measuring 8 cm in greatest dimension. No obstructive changes are seen. The bladder is decompressed. Stomach/Bowel: Diverticular changes noted within the sigmoid colon without evidence of diverticulitis. Smooth muscle thickening is noted related to the diverticular change. The appendix is within normal limits. No small bowel abnormality is seen. The stomach is unremarkable. Vascular/Lymphatic: Aortic atherosclerosis. No enlarged  abdominal or pelvic lymph nodes. Reproductive: Status post hysterectomy. No adnexal masses. Other: No abdominal wall hernia or abnormality. No abdominopelvic ascites. Musculoskeletal: Degenerative changes of the lumbar spine are noted. No acute abnormality is seen. Stable anterolisthesis of L3 on L4 and L4 on L5 is seen. IMPRESSION: Diverticular change with smooth muscle hypertrophy. No findings to suggest diverticulitis are seen. Chronic changes as described above. Electronically Signed   By: Inez Catalina M.D.   On: 11/12/2018 01:37   Ct Abdomen Pelvis W Contrast  Result Date: 10/14/2018 CLINICAL DATA:  Abnormal weight loss with left flank pain. EXAM: CT ABDOMEN AND PELVIS WITH CONTRAST TECHNIQUE: Multidetector CT imaging of the abdomen and pelvis was performed using the standard protocol following bolus administration of intravenous contrast. CONTRAST:  1106mL ISOVUE-300 IOPAMIDOL (ISOVUE-300) INJECTION 61% COMPARISON:  04/23/2018 FINDINGS: Lower chest: Coronary artery calcification is evident. Hepatobiliary: No suspicious focal abnormality within the liver parenchyma. There is no evidence for gallstones, gallbladder wall thickening, or pericholecystic fluid. No intrahepatic or extrahepatic biliary dilation. Pancreas: No focal mass lesion. No dilatation of the main duct. No intraparenchymal cyst. No peripancreatic edema. Spleen: Calcified granulomata. Adrenals/Urinary Tract: No adrenal nodule or mass. 7.8 cm simple cyst identified upper pole right kidney. No suspicious enhancing lesion noted in either kidney. No evidence for hydroureter. The urinary bladder appears normal for the degree of distention. Stomach/Bowel: Stomach is unremarkable. No gastric wall thickening. No evidence of outlet obstruction. Duodenum is normally positioned as is the ligament of Treitz. No small bowel wall thickening. No small bowel dilatation. Patient is noted to have 2 separate short segments of small bowel intussusception. This  involves jejunal loops of the left upper quadrant with a short segment intussusception measuring 2.4 cm well visualized on coronal image 33 of series 5. A second short segment intussusception measures 1.9 cm in length on image 39 of series 5. There is no evidence for bowel wall thickening, perienteric edema, proximal small bowel dilatation, or pneumatosis at either location. Both sites of intussusception persist on the renal delay imaging performed 2 minutes after the portal venous phase study. The terminal ileum is normal. The appendix is normal. No gross colonic mass. No colonic wall thickening. Diverticular changes are noted in the left colon without evidence of diverticulitis. Vascular/Lymphatic: There is abdominal aortic atherosclerosis without aneurysm. There is no gastrohepatic or hepatoduodenal ligament lymphadenopathy. No intraperitoneal or retroperitoneal lymphadenopathy. No pelvic sidewall lymphadenopathy. Reproductive: Uterus surgically absent.  There is no adnexal mass. Other: No intraperitoneal free fluid. Musculoskeletal: Old left pubic rami fractures evident. Degenerative changes noted lumbar spine. IMPRESSION: 1. Two short segments of small bowel intussusception identified in jejunal loops of the left upper quadrant. The segments measure 2.4 and 1.9 cm in length, respectively. This finding is considered indeterminate and small  bowel intussusception can be transient in adults. Neither site is obstructing as proximal small bowel is nondilated and nearly all of the oral contrast has migrated distal to these positions.There are no associated complicating features to suggest bowel ischemia or small bowel obstruction. No associated perienteric edema. No lead point visible by CT imaging. 2. Left colonic diverticulosis without diverticulitis. 3.  Aortic Atherosclerois (ICD10-170.0) Electronically Signed   By: Misty Stanley M.D.   On: 10/14/2018 16:16   Dg Foot 2 Views Left  Result Date:  11/11/2018 CLINICAL DATA:  78 year old female status post fall. Dizziness. Pain and swelling. EXAM: LEFT FOOT - 2 VIEW COMPARISON:  None. FINDINGS: Widespread bulky degenerative tarsal bone spurring. Lesser degenerative spurring of the calcaneus. Mildly comminuted but nondisplaced acute fracture at the base of the 5th metatarsal. No other acute fracture identified. IMPRESSION: Mildly comminuted but nondisplaced fracture at the base of the 5th metatarsal. Electronically Signed   By: Genevie Ann M.D.   On: 11/11/2018 22:10     1:41 AM CT scan without acute findings.  Patient has had 2 additional bloody BM's here but VS remain stable.  She does become tachycardic with ambulating to the bathroom.  Will admit to medicine, GI to consult in AM.  2:16 AM Patient's BP has dropped.  She has had another bloody BM.  BP now 70's/50's.  She is still awake, alert, conversing normally but does appear pale.  Ordered liter of fluids, repeat CBC, and unit of blood for transfusion.  Shortly after this episode, BP back up into the [id number] systolic.  Repeat CBC with hgb of 9.8, drop of a bout 1.5g.  She remains AAOx3, color returning to her face.  States she is feeling better at this time.  Patient has been monitored in the ED throughout the evening while awaiting bed upstairs.  All VS have remained stable.  She is sitting up in bed reading book, NAD.  Will be admitted to SDU.  CRITICAL CARE Performed by: Larene Pickett   Total critical care time: 45 minutes  Critical care time was exclusive of separately billable procedures and treating other patients.  Critical care was necessary to treat or prevent imminent or life-threatening deterioration.  Critical care was time spent personally by me on the following activities: development of treatment plan with patient and/or surrogate as well as nursing, discussions with consultants, evaluation of patient's response to treatment, examination of patient, obtaining history from  patient or surrogate, ordering and performing treatments and interventions, ordering and review of laboratory studies, ordering and review of radiographic studies, pulse oximetry and re-evaluation of patient's condition.    Larene Pickett, PA-C 11/12/18 [id number]    Merryl Hacker, MD 11/12/18 [phone]

## 2018-11-11 NOTE — ED Notes (Addendum)
Pt fell after getting up to throw her trash away. Pt has been showing signs of being dizzy since she has been in the waiting room. Pt has been aware that she needs to stay seated but she wanted to throw her trash away after eating. Pt stated she thinks she was just dizzy b/c she hasn't eaten and her shoes were slippery. With the assistance of Chris-EMT we helped Pt to a wheelchair. Pt alert and is complaining of right hip pain and ankle pain now after the fall. Pt's daughter came in shortly after to check on Pt and talk to staff. Family aware that we are waiting for a room to open and that she would be the next to go back.

## 2018-11-11 NOTE — ED Notes (Signed)
Asked PA about redraw of Hgb level. No further orders at this time.

## 2018-11-11 NOTE — ED Provider Notes (Signed)
[facility] EMERGENCY DEPARTMENT Provider Note   CSN: [id number] Arrival date & time: 11/11/18  1304     History   Chief Complaint Chief Complaint  Patient presents with  . Rectal Bleeding    HPI JAELIANA GLAZER is a 78 y.o. female with a past medical history of diverticulosis, GERD, hypertension, hyperlipidemia, diabetes presents to ED for evaluation of rectal bleeding.  States that she had 2 episodes of bright red blood mixed in with her stool this morning.  She was seen and evaluated by her GI specialist Dr. Collene Mares.  Rectal exam was done, Hemoccult was positive but she was discharged home.  She had another episode when she went home.  Reports left-sided flank pain that has been intermittent for the past several months.  Reports bright red blood with wiping as well.  No history of similar symptoms in the past.  States that her last colonoscopy was in the past 3 years.  She did have diagnostic laparoscopy done approximately 2 weeks ago by Dr. Ninfa Linden which was negative for abnormalities.  She denies any urinary symptoms, vomiting, nausea, sick contacts with similar symptoms, fever, shortness of breath. Of note, patient was in the waiting room for about 8 hours.  She was walking to throw away some trash when she felt like her shoe slipped out from under her, fell onto her right hip.  EKG was shown to have PVCs the patient denies history of this and denies any chest pain, shortness of breath, numbness in arms or legs, headache or head injuries.     HPI  Past Medical History:  Diagnosis Date  . Arthritis   . Diabetes mellitus    Type II  . Diverticulosis   . Dyspnea    climbing stairs  . Family history of malignant neoplasm of gastrointestinal tract   . GERD (gastroesophageal reflux disease)    Maalox prn  . Hyperlipidemia   . Hypertension     Patient Active Problem List   Diagnosis Date Noted  . 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 THYROID DISORDER  04/22/2007  . DM 04/22/2007  . HYPERLIPIDEMIA 04/22/2007  . UNSPECIFIED SINUSITIS 04/22/2007  . DIVERTICULOSIS, COLON 04/22/2007  . ARTHRITIS 04/22/2007  . ELEVATED BLOOD PRESSURE 04/22/2007    Past Surgical History:  Procedure Laterality Date  . BREAST BIOPSY Right    2017-2018  . CARPAL TUNNEL RELEASE Left    left  . COLONOSCOPY    . KNEE ARTHROSCOPY Left   . LAPAROSCOPY N/A 10/27/2018   Procedure: DIAGNOSTIC LAPAROSCOPCY;  Surgeon: Coralie Keens, MD;  Location: [facility];  Service: General;  Laterality: N/A;  . NASAL SEPTUM SURGERY    . TOTAL KNEE ARTHROPLASTY Left 2009   left  . VAGINAL HYSTERECTOMY     with anterior and posterior vaginal repair     OB History   No obstetric history on file.      Home Medications    Prior to Admission medications   Medication Sig Start Date End Date Taking? Authorizing Provider  acetaminophen (TYLENOL) 500 MG tablet Take 500-1,000 mg by mouth daily as needed (pain or headaches).    Yes [provider]  amoxicillin (AMOXIL) 500 MG capsule Take 2,000 mg by mouth See admin instructions. Take 2,000 mg by mouth one hour prior to dental appointments 11/10/18  Yes [provider]  aspirin 81 MG tablet Take 81 mg by mouth daily.     Yes [provider]  atorvastatin (LIPITOR) 80  MG tablet Take 80 mg by mouth daily.     Yes [provider]  Cholecalciferol (VITAMIN D-3) 25 MCG (1000 UT) CAPS Take 1,000 Units by mouth daily.    Yes [provider]  Cyanocobalamin (B-12) 5000 MCG CAPS Take 5,000 mcg by mouth daily.   Yes [provider]  diphenhydramine-acetaminophen (TYLENOL PM) 25-500 MG TABS tablet Take 2 tablets by mouth at bedtime.   Yes [provider]  fluticasone (FLONASE) 50 MCG/ACT nasal spray Place 1-2 sprays into both nostrils daily as needed (for seasonal allergies).    Yes [provider]  levothyroxine (SYNTHROID, LEVOTHROID) 125 MCG tablet Take 125 mcg by mouth  daily.     Yes [provider]  lisinopril (ZESTRIL) 40 MG tablet Take 40 mg by mouth daily.   Yes [provider]  metFORMIN (GLUCOPHAGE) 500 MG tablet Take 500 mg by mouth daily with breakfast.    Yes [provider]  NON FORMULARY Take 1 capsule by mouth See admin instructions. Metagenics UltraFlora IB capsules: Take 1 capsule by mouth once a day in the morning   Yes [provider]  traMADol (ULTRAM) 50 MG tablet Take 1 tablet (50 mg total) by mouth every 6 (six) hours as needed for moderate pain or severe pain. 10/28/18  Yes Coralie Keens, MD  amLODipine (NORVASC) 10 MG tablet Take 10 mg by mouth daily. 10/29/18   [provider]  meclizine (ANTIVERT) 25 MG tablet Take 1 every 8 hours if you have dizziness that involves the room spinning around.  Do not take the medicine if you just feel lightheaded Patient taking differently: Take 25 mg by mouth See admin instructions. Take 25 mg by mouth every eight hours if dizziness that involves the room spinning around is present and avoid taking if only lightheadedness is present 09/14/18   Milton Ferguson, MD    Family History Family History  Problem Relation Age of Onset  . Colon cancer Sister   . Breast cancer Mother   . Tongue cancer Father   . Heart disease Father   . Breast cancer Other        aunt x 2    Social History Social History   Tobacco Use  . Smoking status: Never Smoker  . Smokeless tobacco: Never Used  Substance Use Topics  . Alcohol use: Yes    Alcohol/week: 7.0 - 8.0 standard drinks    Types: 7 - 8 Standard drinks or equivalent per week  . Drug use: No     Allergies   Shellfish allergy and Penicillins   Review of Systems Review of Systems  Constitutional: Negative for appetite change, chills and fever.  HENT: Negative for ear pain, rhinorrhea, sneezing and sore throat.   Eyes: Negative for photophobia and visual disturbance.  Respiratory: Negative for cough, chest  tightness, shortness of breath and wheezing.   Cardiovascular: Negative for chest pain and palpitations.  Gastrointestinal: Positive for abdominal pain and blood in stool. Negative for constipation, diarrhea, nausea and vomiting.  Genitourinary: Negative for dysuria, hematuria and urgency.  Musculoskeletal: Negative for myalgias.  Skin: Negative for rash.  Neurological: Negative for dizziness, weakness and light-headedness.     Physical Exam Updated Vital Signs BP (!) 113/100   Pulse (!) 104   Temp 98.4 F (36.9 C) (Oral)   Resp (!) 22   Ht 5\' 7"  (1.702 m)   Wt 83.5 kg   SpO2 97%   BMI 28.82 kg/m   Physical Exam  Vitals signs and nursing note reviewed. Exam conducted with a chaperone present.  Constitutional:      General: She is not in acute distress.    Appearance: She is well-developed.  HENT:     Head: Normocephalic and atraumatic.     Nose: Nose normal.  Eyes:     General: No scleral icterus.       Right eye: No discharge.        Left eye: No discharge.     Conjunctiva/sclera: Conjunctivae normal.  Neck:     Musculoskeletal: Normal range of motion and neck supple.  Cardiovascular:     Rate and Rhythm: Normal rate and regular rhythm.     Heart sounds: Normal heart sounds. No murmur. No friction rub. No gallop.   Pulmonary:     Effort: Pulmonary effort is normal. No respiratory distress.     Breath sounds: Normal breath sounds.  Abdominal:     General: Bowel sounds are normal. There is no distension.     Palpations: Abdomen is soft.     Tenderness: There is abdominal tenderness (Left flank). There is no guarding.  Genitourinary:    Rectum: Guaiac result positive. No external hemorrhoid or internal hemorrhoid.     Comments: Stool is grossly Hemoccult positive. Musculoskeletal: Normal range of motion.     Comments: TTP at base of 5th MTP joint with normal ROM of ankle and digits. No deformities.  2+ DP pulse palpated bilaterally. FROM of bilateral hips.  Skin:     General: Skin is warm and dry.     Findings: No rash.  Neurological:     Mental Status: She is alert.     Motor: No abnormal muscle tone.     Coordination: Coordination normal.      ED Treatments / Results  Labs (all labs ordered are listed, but only abnormal results are displayed) Labs Reviewed  COMPREHENSIVE METABOLIC PANEL - Abnormal; Notable for the following components:      Result Value   CO2 21 (*)    Glucose, Bld 148 (*)    All other components within normal limits  CBC - Abnormal; Notable for the following components:   RBC 3.73 (*)    Hemoglobin 11.4 (*)    RDW 16.0 (*)    All other components within normal limits  POC OCCULT BLOOD, ED - Abnormal; Notable for the following components:   Fecal Occult Bld POSITIVE (*)    All other components within normal limits  TYPE AND SCREEN  TROPONIN I (HIGH SENSITIVITY)    EKG EKG Interpretation  Date/Time:  Tuesday November 11 2018 18:28:07 EDT Ventricular Rate:  99 PR Interval:  140 QRS Duration: 80 QT Interval:  348 QTC Calculation: 446 R Axis:   -23 Text Interpretation:  Sinus bradycardia with sinus arrhythmia with frequent Premature ventricular complexes Low voltage QRS Cannot rule out Anterior infarct , age undetermined Abnormal ECG Confirmed by Quintella Reichert [phone]) on 11/11/2018 9:07:26 PM   Radiology Dg Ankle 2 Views Left  Result Date: 11/11/2018 CLINICAL DATA:  78 year old female status post fall. Dizziness. Pain and swelling. EXAM: LEFT ANKLE - 2 VIEW COMPARISON:  Left foot series reported separately. FINDINGS: Preserved mortise joint alignment. Bone mineralization is within normal limits. Talar dome intact. No evidence of joint effusion. Distal tibia, fibula and calcaneus appear intact. Calcaneus and midfoot degenerative spurring again noted. Fracture at the base of the left 5th metatarsal redemonstrated. Mild vascular calcifications in the distal left leg. IMPRESSION: 1. No  acute fracture or dislocation  identified about the left ankle. 2. Fracture at the base of the left 5th metatarsal re-demonstrated. Electronically Signed   By: Genevie Ann M.D.   On: 11/11/2018 22:11   Dg Foot 2 Views Left  Result Date: 11/11/2018 CLINICAL DATA:  78 year old female status post fall. Dizziness. Pain and swelling. EXAM: LEFT FOOT - 2 VIEW COMPARISON:  None. FINDINGS: Widespread bulky degenerative tarsal bone spurring. Lesser degenerative spurring of the calcaneus. Mildly comminuted but nondisplaced acute fracture at the base of the 5th metatarsal. No other acute fracture identified. IMPRESSION: Mildly comminuted but nondisplaced fracture at the base of the 5th metatarsal. Electronically Signed   By: Genevie Ann M.D.   On: 11/11/2018 22:10    Procedures Procedures (including critical care time)  Medications Ordered in ED Medications  fentaNYL (SUBLIMAZE) injection 25 mcg (25 mcg Intravenous Given 11/11/18 2326)     Initial Impression / Assessment and Plan / ED Course  I have reviewed the triage vital signs and the nursing notes.  Pertinent labs & imaging results that were available during my care of the patient were reviewed by me and considered in my medical decision making (see chart for details).        78 year old female presents to ED for evaluation of rectal bleeding.  She has had 3 episodes since this morning.  She was evaluated by her gastroenterologist, Hemoccult was positive but she was sent home.  She had one repeat episode when she went home.  She also complains of L foot pain after fall in the lobby while waiting for a room here today. Xray shows fracture at base of 5th metatarsal. She has FROM of bilateral hips. Stool is grossly hemoccult positive on my exam without external abnormalities. Hemoglobin is 11.4. I spoke to Dr. Benson Norway of GI who states we should obtain CT AP with contrast.  Patient will need to be admitted for need and will recontact GI once CT results.  Care handed off her oncoming provider.   Final Clinical Impressions(s) / ED Diagnoses   Final diagnoses:  Rectal bleeding  Closed nondisplaced fracture of fifth metatarsal bone of left foot, initial encounter    ED Discharge Orders    None       Delia Heady, PA-C 11/11/18 2359    Quintella Reichert, MD 11/12/18 2128

## 2018-11-12 ENCOUNTER — Other Ambulatory Visit: Payer: Self-pay

## 2018-11-12 ENCOUNTER — Emergency Department (HOSPITAL_COMMUNITY): Payer: Medicare Other

## 2018-11-12 ENCOUNTER — Encounter (HOSPITAL_COMMUNITY): Payer: Self-pay | Admitting: General Practice

## 2018-11-12 DIAGNOSIS — K5731 Diverticulosis of large intestine without perforation or abscess with bleeding: Secondary | ICD-10-CM | POA: Diagnosis present

## 2018-11-12 DIAGNOSIS — E669 Obesity, unspecified: Secondary | ICD-10-CM | POA: Diagnosis present

## 2018-11-12 DIAGNOSIS — W010XXA Fall on same level from slipping, tripping and stumbling without subsequent striking against object, initial encounter: Secondary | ICD-10-CM | POA: Diagnosis present

## 2018-11-12 DIAGNOSIS — E119 Type 2 diabetes mellitus without complications: Secondary | ICD-10-CM | POA: Diagnosis not present

## 2018-11-12 DIAGNOSIS — Z7951 Long term (current) use of inhaled steroids: Secondary | ICD-10-CM | POA: Diagnosis not present

## 2018-11-12 DIAGNOSIS — D7389 Other diseases of spleen: Secondary | ICD-10-CM | POA: Diagnosis not present

## 2018-11-12 DIAGNOSIS — R110 Nausea: Secondary | ICD-10-CM | POA: Diagnosis not present

## 2018-11-12 DIAGNOSIS — E039 Hypothyroidism, unspecified: Secondary | ICD-10-CM | POA: Diagnosis present

## 2018-11-12 DIAGNOSIS — I959 Hypotension, unspecified: Secondary | ICD-10-CM | POA: Diagnosis present

## 2018-11-12 DIAGNOSIS — Z90710 Acquired absence of both cervix and uterus: Secondary | ICD-10-CM | POA: Diagnosis not present

## 2018-11-12 DIAGNOSIS — E785 Hyperlipidemia, unspecified: Secondary | ICD-10-CM | POA: Diagnosis not present

## 2018-11-12 DIAGNOSIS — I10 Essential (primary) hypertension: Secondary | ICD-10-CM | POA: Diagnosis present

## 2018-11-12 DIAGNOSIS — D62 Acute posthemorrhagic anemia: Secondary | ICD-10-CM

## 2018-11-12 DIAGNOSIS — K219 Gastro-esophageal reflux disease without esophagitis: Secondary | ICD-10-CM | POA: Diagnosis present

## 2018-11-12 DIAGNOSIS — K5730 Diverticulosis of large intestine without perforation or abscess without bleeding: Secondary | ICD-10-CM | POA: Diagnosis not present

## 2018-11-12 DIAGNOSIS — Z6828 Body mass index (BMI) 28.0-28.9, adult: Secondary | ICD-10-CM | POA: Diagnosis not present

## 2018-11-12 DIAGNOSIS — K625 Hemorrhage of anus and rectum: Secondary | ICD-10-CM

## 2018-11-12 DIAGNOSIS — S92355A Nondisplaced fracture of fifth metatarsal bone, left foot, initial encounter for closed fracture: Secondary | ICD-10-CM | POA: Diagnosis not present

## 2018-11-12 DIAGNOSIS — Z96652 Presence of left artificial knee joint: Secondary | ICD-10-CM | POA: Diagnosis present

## 2018-11-12 DIAGNOSIS — Z79899 Other long term (current) drug therapy: Secondary | ICD-10-CM | POA: Diagnosis not present

## 2018-11-12 DIAGNOSIS — Z20828 Contact with and (suspected) exposure to other viral communicable diseases: Secondary | ICD-10-CM | POA: Diagnosis present

## 2018-11-12 DIAGNOSIS — Z880 Allergy status to penicillin: Secondary | ICD-10-CM | POA: Diagnosis not present

## 2018-11-12 DIAGNOSIS — Z7984 Long term (current) use of oral hypoglycemic drugs: Secondary | ICD-10-CM | POA: Diagnosis not present

## 2018-11-12 DIAGNOSIS — Z79890 Hormone replacement therapy (postmenopausal): Secondary | ICD-10-CM | POA: Diagnosis not present

## 2018-11-12 DIAGNOSIS — Z800 Family history of malignant neoplasm of digestive organs: Secondary | ICD-10-CM | POA: Diagnosis not present

## 2018-11-12 DIAGNOSIS — S92912A Unspecified fracture of left toe(s), initial encounter for closed fracture: Secondary | ICD-10-CM | POA: Diagnosis present

## 2018-11-12 DIAGNOSIS — Z91013 Allergy to seafood: Secondary | ICD-10-CM | POA: Diagnosis not present

## 2018-11-12 DIAGNOSIS — Z79891 Long term (current) use of opiate analgesic: Secondary | ICD-10-CM | POA: Diagnosis not present

## 2018-11-12 DIAGNOSIS — Z7982 Long term (current) use of aspirin: Secondary | ICD-10-CM | POA: Diagnosis not present

## 2018-11-12 HISTORY — DX: Hemorrhage of anus and rectum: K62.5

## 2018-11-12 LAB — BASIC METABOLIC PANEL
Anion gap: 11 (ref 5–15)
BUN: 16 mg/dL (ref 8–23)
CO2: 21 mmol/L — ABNORMAL LOW (ref 22–32)
Calcium: 9.4 mg/dL (ref 8.9–10.3)
Chloride: 104 mmol/L (ref 98–111)
Creatinine, Ser: 0.77 mg/dL (ref 0.44–1.00)
GFR calc Af Amer: >60 mL/min (ref >60)
GFR calc non Af Amer: >60 mL/min (ref >60)
Glucose, Bld: 133 mg/dL — ABNORMAL HIGH (ref 70–99)
Potassium: 4.1 mmol/L (ref 3.5–5.1)
Sodium: 136 mmol/L (ref 135–145)

## 2018-11-12 LAB — PREPARE RBC (CROSSMATCH)

## 2018-11-12 LAB — GLUCOSE, CAPILLARY
Glucose-Capillary: 118 mg/dL — ABNORMAL HIGH (ref 70–99)
Glucose-Capillary: 188 mg/dL — ABNORMAL HIGH (ref 70–99)
Glucose-Capillary: 91 mg/dL (ref 70–99)

## 2018-11-12 LAB — APTT: aPTT: 24 seconds (ref 24–36)

## 2018-11-12 LAB — CBC
HCT: 35.8 % — ABNORMAL LOW (ref 36.0–46.0)
HCT: 33.4 % — ABNORMAL LOW (ref 36.0–46.0)
Hemoglobin: 11.8 g/dL — ABNORMAL LOW (ref 12.0–15.0)
Hemoglobin: 10.8 g/dL — ABNORMAL LOW (ref 12.0–15.0)
MCH: 30.9 pg (ref 26.0–34.0)
MCH: 30 pg (ref 26.0–34.0)
MCHC: 33 g/dL (ref 30.0–36.0)
MCHC: 32.3 g/dL (ref 30.0–36.0)
MCV: 93.7 fL (ref 80.0–100.0)
MCV: 92.8 fL (ref 80.0–100.0)
Platelets: 224 10*3/uL (ref 150–400)
Platelets: 239 10*3/uL (ref 150–400)
RBC: 3.82 MIL/uL — ABNORMAL LOW (ref 3.87–5.11)
RBC: 3.6 MIL/uL — ABNORMAL LOW (ref 3.87–5.11)
RDW: 16.8 % — ABNORMAL HIGH (ref 11.5–15.5)
RDW: 16.9 % — ABNORMAL HIGH (ref 11.5–15.5)
WBC: 9.8 10*3/uL (ref 4.0–10.5)
WBC: 7.7 10*3/uL (ref 4.0–10.5)
nRBC: 0 % (ref 0.0–0.2)
nRBC: 0 % (ref 0.0–0.2)

## 2018-11-12 LAB — CBC WITH DIFFERENTIAL/PLATELET
Abs Immature Granulocytes: 0.09 10*3/uL — ABNORMAL HIGH (ref 0.00–0.07)
Basophils Absolute: 0.1 10*3/uL (ref 0.0–0.1)
Basophils Relative: 1 %
Eosinophils Absolute: 0.1 10*3/uL (ref 0.0–0.5)
Eosinophils Relative: 1 %
HCT: 31 % — ABNORMAL LOW (ref 36.0–46.0)
Hemoglobin: 9.8 g/dL — ABNORMAL LOW (ref 12.0–15.0)
Immature Granulocytes: 1 %
Lymphocytes Relative: 23 %
Lymphs Abs: 2.9 10*3/uL (ref 0.7–4.0)
MCH: 31 pg (ref 26.0–34.0)
MCHC: 31.6 g/dL (ref 30.0–36.0)
MCV: 98.1 fL (ref 80.0–100.0)
Monocytes Absolute: 0.9 10*3/uL (ref 0.1–1.0)
Monocytes Relative: 8 %
Neutro Abs: 8.4 10*3/uL — ABNORMAL HIGH (ref 1.7–7.7)
Neutrophils Relative %: 66 %
Platelets: 273 10*3/uL (ref 150–400)
RBC: 3.16 MIL/uL — ABNORMAL LOW (ref 3.87–5.11)
RDW: 16 % — ABNORMAL HIGH (ref 11.5–15.5)
WBC: 12.5 10*3/uL — ABNORMAL HIGH (ref 4.0–10.5)
nRBC: 0 % (ref 0.0–0.2)

## 2018-11-12 LAB — CBG MONITORING, ED
Glucose-Capillary: 158 mg/dL — ABNORMAL HIGH (ref 70–99)
Glucose-Capillary: 125 mg/dL — ABNORMAL HIGH (ref 70–99)

## 2018-11-12 LAB — PROTIME-INR
INR: 1.1 (ref 0.8–1.2)
Prothrombin Time: 14.3 seconds (ref 11.4–15.2)

## 2018-11-12 LAB — SARS CORONAVIRUS 2 BY RT PCR (HOSPITAL ORDER, PERFORMED IN ~~LOC~~ HOSPITAL LAB): SARS Coronavirus 2: NEGATIVE

## 2018-11-12 MED ORDER — DIPHENHYDRAMINE-APAP (SLEEP) 25-500 MG PO TABS: 2.0000 | ORAL_TABLET | Freq: Every day | ORAL | Status: DC

## 2018-11-12 MED ORDER — DIPHENHYDRAMINE HCL 25 MG PO CAPS
50.0000 mg | ORAL_CAPSULE | Freq: Every day | ORAL | Status: DC
  Administered 2018-11-12 – 2018-11-13 (×2): 50 mg via ORAL
  Filled 2018-11-12 (×2): qty 2

## 2018-11-12 MED ORDER — ACETAMINOPHEN 325 MG PO TABS: 650.0000 mg | ORAL_TABLET | Freq: Four times a day (QID) | ORAL | Status: DC | PRN

## 2018-11-12 MED ORDER — ONDANSETRON HCL 4 MG PO TABS: 4.0000 mg | ORAL_TABLET | Freq: Four times a day (QID) | ORAL | Status: DC | PRN

## 2018-11-12 MED ORDER — ATORVASTATIN CALCIUM 80 MG PO TABS
80.0000 mg | ORAL_TABLET | Freq: Every day | ORAL | Status: DC
  Administered 2018-11-12 – 2018-11-14 (×3): 80 mg via ORAL
  Filled 2018-11-12 (×3): qty 1

## 2018-11-12 MED ORDER — LEVOTHYROXINE SODIUM 25 MCG PO TABS: 125.0000 ug | ORAL_TABLET | Freq: Every day | ORAL | Status: DC

## 2018-11-12 MED ORDER — INSULIN ASPART 100 UNIT/ML ~~LOC~~ SOLN: 0.0000 [IU] | Freq: Every day | SUBCUTANEOUS | Status: DC

## 2018-11-12 MED ORDER — ACETAMINOPHEN 650 MG RE SUPP: 650.0000 mg | Freq: Four times a day (QID) | RECTAL | Status: DC | PRN

## 2018-11-12 MED ORDER — FLUTICASONE PROPIONATE 50 MCG/ACT NA SUSP: 1.0000 | Freq: Every day | NASAL | Status: DC | PRN

## 2018-11-12 MED ORDER — ONDANSETRON HCL 4 MG/2ML IJ SOLN: 4.0000 mg | Freq: Four times a day (QID) | INTRAMUSCULAR | Status: DC | PRN

## 2018-11-12 MED ORDER — LEVOTHYROXINE SODIUM 25 MCG PO TABS
125.0000 ug | ORAL_TABLET | Freq: Every day | ORAL | Status: DC
  Administered 2018-11-12 – 2018-11-14 (×3): 125 ug via ORAL
  Filled 2018-11-12 (×3): qty 1

## 2018-11-12 MED ORDER — SODIUM CHLORIDE 0.9 % IV SOLN
INTRAVENOUS | Status: DC
  Administered 2018-11-12 – 2018-11-13 (×3): via INTRAVENOUS

## 2018-11-12 MED ORDER — VITAMIN B-12 1000 MCG PO TABS
5000.0000 ug | ORAL_TABLET | Freq: Every day | ORAL | Status: DC
  Administered 2018-11-12 – 2018-11-14 (×3): 5000 ug via ORAL
  Filled 2018-11-12 (×3): qty 5

## 2018-11-12 MED ORDER — PANTOPRAZOLE SODIUM 40 MG PO TBEC
40.0000 mg | DELAYED_RELEASE_TABLET | Freq: Every day | ORAL | Status: DC
  Administered 2018-11-12 – 2018-11-13 (×2): 40 mg via ORAL
  Filled 2018-11-12 (×2): qty 1

## 2018-11-12 MED ORDER — HYDRALAZINE HCL 20 MG/ML IJ SOLN: 5.0000 mg | INTRAMUSCULAR | Status: DC | PRN

## 2018-11-12 MED ORDER — ACETAMINOPHEN 500 MG PO TABS
1000.0000 mg | ORAL_TABLET | Freq: Every day | ORAL | Status: DC
  Administered 2018-11-12 – 2018-11-13 (×2): 1000 mg via ORAL
  Filled 2018-11-12 (×2): qty 2

## 2018-11-12 MED ORDER — IOHEXOL 300 MG/ML  SOLN
100.0000 mL | Freq: Once | INTRAMUSCULAR | Status: AC | PRN
  Administered 2018-11-12: 100 mL via INTRAVENOUS

## 2018-11-12 MED ORDER — TRAMADOL HCL 50 MG PO TABS: 50.0000 mg | ORAL_TABLET | Freq: Four times a day (QID) | ORAL | Status: DC | PRN

## 2018-11-12 MED ORDER — SODIUM CHLORIDE 0.9% IV SOLUTION: Freq: Once | INTRAVENOUS | Status: DC

## 2018-11-12 MED ORDER — VITAMIN D 25 MCG (1000 UNIT) PO TABS
1000.0000 [IU] | ORAL_TABLET | Freq: Every day | ORAL | Status: DC
  Administered 2018-11-12 – 2018-11-14 (×3): 1000 [IU] via ORAL
  Filled 2018-11-12 (×3): qty 1

## 2018-11-12 MED ORDER — FENTANYL CITRATE (PF) 100 MCG/2ML IJ SOLN
50.0000 ug | Freq: Once | INTRAMUSCULAR | Status: AC
  Administered 2018-11-12: 50 ug via INTRAVENOUS
  Filled 2018-11-12: qty 2

## 2018-11-12 MED ORDER — MECLIZINE HCL 25 MG PO TABS
25.0000 mg | ORAL_TABLET | Freq: Three times a day (TID) | ORAL | Status: DC | PRN
  Filled 2018-11-12: qty 1

## 2018-11-12 MED ORDER — SODIUM CHLORIDE 0.9 % IV BOLUS
1000.0000 mL | Freq: Once | INTRAVENOUS | Status: AC
  Administered 2018-11-12: 1000 mL via INTRAVENOUS

## 2018-11-12 MED ORDER — MORPHINE SULFATE (PF) 2 MG/ML IV SOLN: 0.5000 mg | INTRAVENOUS | Status: DC | PRN

## 2018-11-12 MED ORDER — INSULIN ASPART 100 UNIT/ML ~~LOC~~ SOLN
0.0000 [IU] | Freq: Three times a day (TID) | SUBCUTANEOUS | Status: DC
  Administered 2018-11-12: 2 [IU] via SUBCUTANEOUS

## 2018-11-12 NOTE — ED Notes (Signed)
Patients daughter Webb Silversmith updated regarding patient having an inpatient bed and transporting upstairs.

## 2018-11-12 NOTE — Progress Notes (Signed)
PROGRESS NOTE        PATIENT DETAILS Name: Michele Dawson Age: 78 y.o. Sex: female Date of Birth: August 07, 1940 Admit Date: 11/11/2018 Admitting Physician Ivor Costa, MD [id number], Lattie Haw, MD  Brief Narrative: Patient is a 78 y.o. female with history of HTN, dyslipidemia, DM-2, GERD, diverticulosis-chronic left lower abdominal pain-s/p negative diagnostic laparoscopy on 8/31-presented with painless rectal bleeding with acute blood loss anemia.  High suspicion for diverticular etiology.  See below for further details  Subjective: Has not had any hematochezia since yesterday evening.  Continues to have intermittent left-sided abdominal pain.  Assessment/Plan: Lower GI bleeding with acute blood loss anemia: Suspicion for diverticular bleed-patient is s/p 1 unit of PRBC last night-with appropriate rise in hemoglobin.  Continue to follow CBC today-hopefully she will not have any further episodes of hematochezia.  Awaiting GI evaluation (consulted by admitting MD)-starting clear liquids.  Suspect that if no further hematochezia today-she could potentially go home in the morning.  Chronic left lower abdominal pain: Apparently has had extensive evaluation in the outpatient setting by gastroenterologist-she even had a diagnostic laparotomy on 8/31 that did not show any obvious abnormalities.  Continue with as needed tramadol.  Dyslipidemia: Continue statin  DM-2: CBGs stable with SSI  Hypothyroidism: Continue with Synthroid  Left fifth metatarsal fracture: Supportive care  Diet: Diet Order            Diet clear liquid Room service appropriate? Yes; Fluid consistency: Thin  Diet effective now               DVT Prophylaxis: SCD's  Code Status: Full code   Family Communication: None at bedside  Disposition Plan: Remain inpatient  Barrier to discharge: Resolving hematochezia-awaiting GI evaluation-awaiting repeat CBC later today.  Antimicrobial  agents: Anti-infectives (From admission, onward)   None      Procedures: None  CONSULTS: GI  Time spent: 25 minutes-Greater than 50% of this time was spent in counseling, explanation of diagnosis, planning of further management, and coordination of care.  MEDICATIONS: Scheduled Meds:  sodium chloride   Intravenous Once   sodium chloride   Intravenous Once   diphenhydrAMINE  50 mg Oral QHS   And   acetaminophen  1,000 mg Oral QHS   atorvastatin  80 mg Oral Daily   cholecalciferol  1,000 Units Oral Daily   insulin aspart  0-5 Units Subcutaneous QHS   insulin aspart  0-9 Units Subcutaneous TID WC   levothyroxine  125 mcg Oral Q0600   pantoprazole  40 mg Oral Q1200   vitamin B-12  5,000 mcg Oral Daily   Continuous Infusions:  sodium chloride     PRN Meds:.acetaminophen **OR** acetaminophen, fluticasone, hydrALAZINE, meclizine, morphine injection, ondansetron **OR** ondansetron (ZOFRAN) IV, traMADol   PHYSICAL EXAM: Vital signs: Vitals:   11/12/18 0452 11/12/18 0500 11/12/18 0658 11/12/18 0846  BP: (!) 112/101 120/85 134/80 (!) 116/91  Pulse: 89 96 94 87  Resp: 17 20 (!) 21 18  Temp: 97.7 F (36.5 C)   97.9 F (36.6 C)  TempSrc: Oral   Oral  SpO2: 98% 96% 98% 98%  Weight:      Height:       Filed Weights   11/11/18 2058  Weight: 83.5 kg   Body mass index is 28.82 kg/m.   Gen Exam:Alert awake-not in any distress HEENT:atraumatic, normocephalic Chest: B/L clear  to auscultation anteriorly CVS:S1S2 regular Abdomen:soft non tender, non distended Extremities:no edema Neurology: Non focal Skin: no rash  I have personally reviewed following labs and imaging studies  LABORATORY DATA: CBC: Recent Labs  Lab 11/11/18 1329 11/12/18 0222 11/12/18 0612  WBC 8.5 12.5* 9.8  NEUTROABS  --  8.4*  --   HGB 11.4* 9.8* 11.8*  HCT 36.4 31.0* 35.8*  MCV 97.6 98.1 93.7  PLT 276 273 [id number]    Basic Metabolic Panel: Recent Labs  Lab 11/11/18 1329  11/12/18 0612  NA 138 136  K 3.9 4.1  CL 105 104  CO2 21* 21*  GLUCOSE 148* 133*  BUN 17 16  CREATININE 0.74 0.77  CALCIUM 9.7 9.4    GFR: Estimated Creatinine Clearance: 65.5 mL/min (by C-G formula based on SCr of 0.77 mg/dL).  Liver Function Tests: Recent Labs  Lab 11/11/18 1329  AST 27  ALT 16  ALKPHOS 71  BILITOT 0.7  PROT 7.2  ALBUMIN 3.8   No results for input(s): LIPASE, AMYLASE in the last 168 hours. No results for input(s): AMMONIA in the last 168 hours.  Coagulation Profile: Recent Labs  Lab 11/12/18 0247  INR 1.1    Cardiac Enzymes: No results for input(s): CKTOTAL, CKMB, CKMBINDEX, TROPONINI in the last 168 hours.  BNP (last 3 results) No results for input(s): PROBNP in the last 8760 hours.  HbA1C: No results for input(s): HGBA1C in the last 72 hours.  CBG: Recent Labs  Lab 11/12/18 0212 11/12/18 0755 11/12/18 1056  GLUCAP 158* 125* 118*    Lipid Profile: No results for input(s): CHOL, HDL, LDLCALC, TRIG, CHOLHDL, LDLDIRECT in the last 72 hours.  Thyroid Function Tests: No results for input(s): TSH, T4TOTAL, FREET4, T3FREE, THYROIDAB in the last 72 hours.  Anemia Panel: No results for input(s): VITAMINB12, FOLATE, FERRITIN, TIBC, IRON, RETICCTPCT in the last 72 hours.  Urine analysis:    Component Value Date/Time   COLORURINE YELLOW 09/14/2018 1121   APPEARANCEUR CLEAR 09/14/2018 1121   LABSPEC 1.010 09/14/2018 1121   PHURINE 5.0 09/14/2018 1121   GLUCOSEU NEGATIVE 09/14/2018 1121   HGBUR MODERATE (A) 09/14/2018 1121   BILIRUBINUR NEGATIVE 09/14/2018 1121   KETONESUR NEGATIVE 09/14/2018 1121   PROTEINUR NEGATIVE 09/14/2018 1121   UROBILINOGEN 0.2 03/03/2008 1315   NITRITE NEGATIVE 09/14/2018 1121   LEUKOCYTESUR TRACE (A) 09/14/2018 1121    Sepsis Labs: Lactic Acid, Venous No results found for: LATICACIDVEN  MICROBIOLOGY: Recent Results (from the past 240 hour(s))  SARS Coronavirus 2 [facility] order, Performed in [facility] hospital lab) Nasopharyngeal Nasopharyngeal Swab     Status: None   Collection Time: 11/12/18  1:51 AM   Specimen: Nasopharyngeal Swab  Result Value Ref Range Status   SARS Coronavirus 2 NEGATIVE NEGATIVE Final    Comment: (NOTE) If result is NEGATIVE SARS-CoV-2 target nucleic acids are NOT DETECTED. The SARS-CoV-2 RNA is generally detectable in upper and lower  respiratory specimens during the acute phase of infection. The lowest  concentration of SARS-CoV-2 viral copies this assay can detect is 250  copies / mL. A negative result does not preclude SARS-CoV-2 infection  and should not be used as the sole basis for treatment or other  patient management decisions.  A negative result may occur with  improper specimen collection / handling, submission of specimen other  than nasopharyngeal swab, presence of viral mutation(s) within the  areas targeted by this assay, and inadequate number of viral copies  (<250 copies / mL). A  negative result must be combined with clinical  observations, patient history, and epidemiological information. If result is POSITIVE SARS-CoV-2 target nucleic acids are DETECTED. The SARS-CoV-2 RNA is generally detectable in upper and lower  respiratory specimens dur ing the acute phase of infection.  Positive  results are indicative of active infection with SARS-CoV-2.  Clinical  correlation with patient history and other diagnostic information is  necessary to determine patient infection status.  Positive results do  not rule out bacterial infection or co-infection with other viruses. If result is PRESUMPTIVE POSTIVE SARS-CoV-2 nucleic acids MAY BE PRESENT.   A presumptive positive result was obtained on the submitted specimen  and confirmed on repeat testing.  While 2019 novel coronavirus  (SARS-CoV-2) nucleic acids may be present in the submitted sample  additional confirmatory testing may be necessary for epidemiological  and / or clinical management  purposes  to differentiate between  SARS-CoV-2 and other Sarbecovirus currently known to infect humans.  If clinically indicated additional testing with an alternate test  methodology [phone]) is advised. The SARS-CoV-2 RNA is generally  detectable in upper and lower respiratory sp ecimens during the acute  phase of infection. The expected result is Negative. Fact Sheet for Patients:  StrictlyIdeas.no Fact Sheet for Healthcare Providers: BankingDealers.co.za This test is not yet approved or cleared by the [place] FDA and has been authorized for detection and/or diagnosis of SARS-CoV-2 by FDA under an Emergency Use Authorization (EUA).  This EUA will remain in effect (meaning this test can be used) for the duration of the COVID-19 declaration under Section 564(b)(1) of the Act, 21 U.S.C. section 360bbb-3(b)(1), unless the authorization is terminated or revoked sooner. Performed at [facility], [employer] [address], [place], [place] [address]     RADIOLOGY STUDIES/RESULTS: Dg Ankle 2 Views Left  Result Date: 11/11/2018 CLINICAL DATA:  78 year old female status post fall. Dizziness. Pain and swelling. EXAM: LEFT ANKLE - 2 VIEW COMPARISON:  Left foot series reported separately. FINDINGS: Preserved mortise joint alignment. Bone mineralization is within normal limits. Talar dome intact. No evidence of joint effusion. Distal tibia, fibula and calcaneus appear intact. Calcaneus and midfoot degenerative spurring again noted. Fracture at the base of the left 5th metatarsal redemonstrated. Mild vascular calcifications in the distal left leg. IMPRESSION: 1. No acute fracture or dislocation identified about the left ankle. 2. Fracture at the base of the left 5th metatarsal re-demonstrated. Electronically Signed   By: Genevie Ann M.D.   On: 11/11/2018 22:11   Ct Abdomen Pelvis W Contrast  Result Date: 11/12/2018 CLINICAL DATA:  Abdominal pain and  rectal bleeding EXAM: CT ABDOMEN AND PELVIS WITH CONTRAST TECHNIQUE: Multidetector CT imaging of the abdomen and pelvis was performed using the standard protocol following bolus administration of intravenous contrast. CONTRAST:  167mL OMNIPAQUE IOHEXOL 300 MG/ML  SOLN COMPARISON:  10/14/2018 FINDINGS: Lower chest: No acute abnormality. Hepatobiliary: Fatty infiltration of the liver is noted. No focal mass is seen. The gallbladder is well distended without focal abnormality. Pancreas: Pancreas is within normal limits. Spleen: Spleen demonstrates multiple calcified granulomas. Adrenals/Urinary Tract: Adrenal glands are unremarkable. Left kidney is within normal limits. Right kidney demonstrates a large cyst in the upper pole measuring 8 cm in greatest dimension. No obstructive changes are seen. The bladder is decompressed. Stomach/Bowel: Diverticular changes noted within the sigmoid colon without evidence of diverticulitis. Smooth muscle thickening is noted related to the diverticular change. The appendix is within normal limits. No small bowel abnormality is seen. The stomach  is unremarkable. Vascular/Lymphatic: Aortic atherosclerosis. No enlarged abdominal or pelvic lymph nodes. Reproductive: Status post hysterectomy. No adnexal masses. Other: No abdominal wall hernia or abnormality. No abdominopelvic ascites. Musculoskeletal: Degenerative changes of the lumbar spine are noted. No acute abnormality is seen. Stable anterolisthesis of L3 on L4 and L4 on L5 is seen. IMPRESSION: Diverticular change with smooth muscle hypertrophy. No findings to suggest diverticulitis are seen. Chronic changes as described above. Electronically Signed   By: Inez Catalina M.D.   On: 11/12/2018 01:37   Ct Abdomen Pelvis W Contrast  Result Date: 10/14/2018 CLINICAL DATA:  Abnormal weight loss with left flank pain. EXAM: CT ABDOMEN AND PELVIS WITH CONTRAST TECHNIQUE: Multidetector CT imaging of the abdomen and pelvis was performed using  the standard protocol following bolus administration of intravenous contrast. CONTRAST:  185mL ISOVUE-300 IOPAMIDOL (ISOVUE-300) INJECTION 61% COMPARISON:  04/23/2018 FINDINGS: Lower chest: Coronary artery calcification is evident. Hepatobiliary: No suspicious focal abnormality within the liver parenchyma. There is no evidence for gallstones, gallbladder wall thickening, or pericholecystic fluid. No intrahepatic or extrahepatic biliary dilation. Pancreas: No focal mass lesion. No dilatation of the main duct. No intraparenchymal cyst. No peripancreatic edema. Spleen: Calcified granulomata. Adrenals/Urinary Tract: No adrenal nodule or mass. 7.8 cm simple cyst identified upper pole right kidney. No suspicious enhancing lesion noted in either kidney. No evidence for hydroureter. The urinary bladder appears normal for the degree of distention. Stomach/Bowel: Stomach is unremarkable. No gastric wall thickening. No evidence of outlet obstruction. Duodenum is normally positioned as is the ligament of Treitz. No small bowel wall thickening. No small bowel dilatation. Patient is noted to have 2 separate short segments of small bowel intussusception. This involves jejunal loops of the left upper quadrant with a short segment intussusception measuring 2.4 cm well visualized on coronal image 33 of series 5. A second short segment intussusception measures 1.9 cm in length on image 39 of series 5. There is no evidence for bowel wall thickening, perienteric edema, proximal small bowel dilatation, or pneumatosis at either location. Both sites of intussusception persist on the renal delay imaging performed 2 minutes after the portal venous phase study. The terminal ileum is normal. The appendix is normal. No gross colonic mass. No colonic wall thickening. Diverticular changes are noted in the left colon without evidence of diverticulitis. Vascular/Lymphatic: There is abdominal aortic atherosclerosis without aneurysm. There is no  gastrohepatic or hepatoduodenal ligament lymphadenopathy. No intraperitoneal or retroperitoneal lymphadenopathy. No pelvic sidewall lymphadenopathy. Reproductive: Uterus surgically absent.  There is no adnexal mass. Other: No intraperitoneal free fluid. Musculoskeletal: Old left pubic rami fractures evident. Degenerative changes noted lumbar spine. IMPRESSION: 1. Two short segments of small bowel intussusception identified in jejunal loops of the left upper quadrant. The segments measure 2.4 and 1.9 cm in length, respectively. This finding is considered indeterminate and small bowel intussusception can be transient in adults. Neither site is obstructing as proximal small bowel is nondilated and nearly all of the oral contrast has migrated distal to these positions.There are no associated complicating features to suggest bowel ischemia or small bowel obstruction. No associated perienteric edema. No lead point visible by CT imaging. 2. Left colonic diverticulosis without diverticulitis. 3.  Aortic Atherosclerois (ICD10-170.0) Electronically Signed   By: Misty Stanley M.D.   On: 10/14/2018 16:16   Dg Foot 2 Views Left  Result Date: 11/11/2018 CLINICAL DATA:  78 year old female status post fall. Dizziness. Pain and swelling. EXAM: LEFT FOOT - 2 VIEW COMPARISON:  None. FINDINGS: Widespread bulky degenerative tarsal  bone spurring. Lesser degenerative spurring of the calcaneus. Mildly comminuted but nondisplaced acute fracture at the base of the 5th metatarsal. No other acute fracture identified. IMPRESSION: Mildly comminuted but nondisplaced fracture at the base of the 5th metatarsal. Electronically Signed   By: Genevie Ann M.D.   On: 11/11/2018 22:10     LOS: 0 days   Oren Binet, MD  [facility]  If 7PM-7AM, please contact night-coverage  Please page via [email or web address]  Go to [email or web address] and use [place]'s universal password to access. If you do not have the password, please contact the hospital  operator.  Locate the [facility] provider you are looking for under [facility] and page to a number that you can be directly reached. If you still have difficulty reaching the provider, please page the [facility] (Director on Call) for the Hospitalists listed on amion for assistance.  11/12/2018, 1:20 PM

## 2018-11-12 NOTE — ED Notes (Signed)
MD notified of blood in toilet post restroom visit.

## 2018-11-12 NOTE — Consult Note (Addendum)
Reason for Consult: Rectal bleeding. Referring Physician: THP  Michele Dawson is an 78 y.o. female.  HPI: Michele Dawson is a 78 year old white female, with multiple medical problems listed below, who was seem emergently in our office for painless rectal bleeding that started shortly after she woke up yesterday. She denies having any syncope or near syncope with the bleeding. In the office she had stable vital signs and her hemoglobin was 11.9 gms/dl. She was advised to go home and call us if the bleeding became more profuse; later on in the day she had several bloody BM's prompting her to come to the ER. She had a CTA on admission that reveled extensive diverticulosis as a source of her hematochezia. She has some LLQ/left flank discomfort but this is not a new issue for her. Her last colonoscopy was done in 11/2015 when extensive pandiverticulosis was noted. Her sister was diagnosed with colon cancer in her 56's. Patient denies having any other GI problems at this time. She had a recent laparoscopy by Dr. Mercy Riding for ?intussusception but her entire GI appeared normal except for the colonic diverticula. She has been relatively stable today with 1 small volume BM that had a small amount of blood in it. Her hemoglobin is 10.8 gms/dl today. She has occasional problems with reflux and tales OTC meds as needed. She also used to have problems with constipation but that has improved a lot lately.     Past Medical History:  Diagnosis Date  . Arthritis   . Diabetes mellitus    Type II  . Diverticulosis   . Dyspnea    climbing stairs  . Family history of malignant neoplasm of gastrointestinal tract   . GERD (gastroesophageal reflux disease)    Maalox prn  . Hyperlipidemia   . Hypertension    Past Surgical History:  Procedure Laterality Date  . BREAST BIOPSY Right    2017-2018  . CARPAL TUNNEL RELEASE Left    left  . COLONOSCOPY    . KNEE ARTHROSCOPY Left   . LAPAROSCOPY N/A 10/27/2018    Procedure: DIAGNOSTIC LAPAROSCOPCY;  Surgeon: Coralie Keens, MD;  Location: [facility];  Service: General;  Laterality: N/A;  . NASAL SEPTUM SURGERY    . TOTAL KNEE ARTHROPLASTY Left 2009   left  . VAGINAL HYSTERECTOMY     with anterior and posterior vaginal repair   Family History  Problem Relation Age of Onset  . Colon cancer Sister   . Breast cancer Mother   . Tongue cancer Father   . Heart disease Father   . Breast cancer Other        aunt x 2   Social History:  reports that she has never smoked. She has never used smokeless tobacco. She reports current alcohol use of about 7.0 - 8.0 standard drinks of alcohol per week. She reports that she does not use drugs.  Allergies:  Allergies  Allergen Reactions  . Shellfish Allergy Hives  . Penicillins Rash    Did it involve swelling of the face/tongue/throat, SOB, or low BP? Yes Did it involve sudden or severe rash/hives, skin peeling, or any reaction on the inside of your mouth or nose? Unk Did you need to seek medical attention at a hospital or doctor's office? Unk When did it last happen?25 years ago If all above answers are "NO", may proceed with cephalosporin use.     Medications: I have reviewed the patient's current medications.  Results for orders placed  or performed during the hospital encounter of 11/11/18 (from the past 48 hour(s))  Comprehensive metabolic panel     Status: Abnormal   Collection Time: 11/11/18  1:29 PM  Result Value Ref Range   Sodium 138 135 - 145 mmol/L   Potassium 3.9 3.5 - 5.1 mmol/L   Chloride 105 98 - 111 mmol/L   CO2 21 (L) 22 - 32 mmol/L   Glucose, Bld 148 (H) 70 - 99 mg/dL   BUN 17 8 - 23 mg/dL   Creatinine, Ser 0.74 0.44 - 1.00 mg/dL   Calcium 9.7 8.9 - 10.3 mg/dL   Total Protein 7.2 6.5 - 8.1 g/dL   Albumin 3.8 3.5 - 5.0 g/dL   AST 27 15 - 41 U/L   ALT 16 0 - 44 U/L   Alkaline Phosphatase 71 38 - 126 U/L   Total Bilirubin 0.7 0.3 - 1.2 mg/dL   GFR calc non Af Amer >60 >60 mL/min    GFR calc Af Amer >60 >60 mL/min   Anion gap 12 5 - 15    Comment: Performed at [facility], [address], [place], [place] [address]  CBC     Status: Abnormal   Collection Time: 11/11/18  1:29 PM  Result Value Ref Range   WBC 8.5 4.0 - 10.5 K/uL   RBC 3.73 (L) 3.87 - 5.11 MIL/uL   Hemoglobin 11.4 (L) 12.0 - 15.0 g/dL   HCT 36.4 36.0 - 46.0 %   MCV 97.6 80.0 - 100.0 fL   MCH 30.6 26.0 - 34.0 pg   MCHC 31.3 30.0 - 36.0 g/dL   RDW 16.0 (H) 11.5 - 15.5 %   Platelets 276 150 - 400 K/uL   nRBC 0.0 0.0 - 0.2 %    Comment: Performed at [facility], [place] [address], [place], [place] [address]  Type and screen [place]     Status: None (Preliminary result)   Collection Time: 11/11/18  1:29 PM  Result Value Ref Range   ABO/RH(D) O POS    Antibody Screen NEG    Sample Expiration      11/14/2018,2359 Performed at [facility], [place] [address] [place], [place] [address]    Unit Number [id number]    Blood Component Type RBC LR PHER1    Unit division 00    Status of Unit ISSUED    Transfusion Status OK TO TRANSFUSE    Crossmatch Result Compatible    Unit Number [id number]    Blood Component Type RED CELLS,LR    Unit division 00    Status of Unit ALLOCATED    Transfusion Status OK TO TRANSFUSE    Crossmatch Result Compatible   POC occult blood, ED     Status: Abnormal   Collection Time: 11/11/18  9:42 PM  Result Value Ref Range   Fecal Occult Bld POSITIVE (A) NEGATIVE  Troponin I (High Sensitivity)     Status: None   Collection Time: 11/11/18 10:18 PM  Result Value Ref Range   Troponin I (High Sensitivity) 10 <18 ng/L    Comment: (NOTE) Elevated high sensitivity troponin I (hsTnI) values and significant  changes across serial measurements may suggest ACS but many other  chronic and acute conditions are known to elevate hsTnI results.  Refer to the "Links" section for chest pain algorithms and additional  guidance. Performed at [facility], [place] [address], [place],  [address]   SARS  Coronavirus 2 [facility] order, Performed in [facility] hospital lab) Nasopharyngeal Nasopharyngeal Swab     Status: None   Collection Time: 11/12/18  1:51 AM   Specimen: Nasopharyngeal Swab  Result Value Ref Range   SARS Coronavirus 2 NEGATIVE NEGATIVE    Comment: (NOTE) If result is NEGATIVE SARS-CoV-2 target nucleic acids are NOT DETECTED. The SARS-CoV-2 RNA is generally detectable in upper and lower  respiratory specimens during the acute phase of infection. The lowest  concentration of SARS-CoV-2 viral copies this assay can detect is 250  copies / mL. A negative result does not preclude SARS-CoV-2 infection  and should not be used as the sole basis for treatment or other  patient management decisions.  A negative result may occur with  improper specimen collection / handling, submission of specimen other  than nasopharyngeal swab, presence of viral mutation(s) within the  areas targeted by this assay, and inadequate number of viral copies  (<250 copies / mL). A negative result must be combined with clinical  observations, patient history, and epidemiological information. If result is POSITIVE SARS-CoV-2 target nucleic acids are DETECTED. The SARS-CoV-2 RNA is generally detectable in upper and lower  respiratory specimens dur ing the acute phase of infection.  Positive  results are indicative of active infection with SARS-CoV-2.  Clinical  correlation with patient history and other diagnostic information is  necessary to determine patient infection status.  Positive results do  not rule out bacterial infection or co-infection with other viruses. If result is PRESUMPTIVE POSTIVE SARS-CoV-2 nucleic acids MAY BE PRESENT.   A presumptive positive result was obtained on the submitted specimen  and confirmed on repeat testing.  While 2019 novel coronavirus  (SARS-CoV-2) nucleic acids may be present in the submitted  sample  additional confirmatory testing may be necessary for epidemiological  and / or clinical management purposes  to differentiate between  SARS-CoV-2 and other Sarbecovirus currently known to infect humans.  If clinically indicated additional testing with an alternate test  methodology [id number]) is advised. The SARS-CoV-2 RNA is generally  detectable in upper and lower respiratory sp ecimens during the acute  phase of infection. The expected result is Negative. Fact Sheet for Patients:  StrictlyIdeas.no Fact Sheet for Healthcare Providers: BankingDealers.co.za This test is not yet approved or cleared by the [place] FDA and has been authorized for detection and/or diagnosis of SARS-CoV-2 by FDA under an Emergency Use Authorization (EUA).  This EUA will remain in effect (meaning this test can be used) for the duration of the COVID-19 declaration under Section 564(b)(1) of the Act, 21 U.S.C. section 360bbb-3(b)(1), unless the authorization is terminated or revoked sooner. Performed at [facility], [place] [address] [place], [place] [address]   CBG monitoring, ED     Status: Abnormal   Collection Time: 11/12/18  2:12 AM  Result Value Ref Range   Glucose-Capillary 158 (H) 70 - 99 mg/dL  Prepare RBC     Status: None   Collection Time: 11/12/18  2:16 AM  Result Value Ref Range   Order Confirmation      ORDER PROCESSED BY BLOOD BANK Performed at [facility], [place] [address], [place],  [address]   CBC with Differential     Status: Abnormal   Collection Time: 11/12/18  2:22 AM  Result Value Ref Range   WBC 12.5 (H) 4.0 - 10.5 K/uL   RBC 3.16 (L) 3.87 - 5.11 MIL/uL   Hemoglobin 9.8 (L) 12.0 - 15.0  g/dL   HCT 31.0 (L) 36.0 - 46.0 %   MCV 98.1 80.0 - 100.0 fL   MCH 31.0 26.0 - 34.0 pg   MCHC 31.6 30.0 - 36.0 g/dL   RDW 16.0 (H) 11.5 - 15.5 %   Platelets 273 150 - 400 K/uL   nRBC 0.0 0.0 - 0.2 %   Neutrophils  Relative % 66 %   Neutro Abs 8.4 (H) 1.7 - 7.7 K/uL   Lymphocytes Relative 23 %   Lymphs Abs 2.9 0.7 - 4.0 K/uL   Monocytes Relative 8 %   Monocytes Absolute 0.9 0.1 - 1.0 K/uL   Eosinophils Relative 1 %   Eosinophils Absolute 0.1 0.0 - 0.5 K/uL   Basophils Relative 1 %   Basophils Absolute 0.1 0.0 - 0.1 K/uL   Immature Granulocytes 1 %   Abs Immature Granulocytes 0.09 (H) 0.00 - 0.07 K/uL    Comment: Performed at [facility] [address], [place], [place] [address]  Protime-INR     Status: None   Collection Time: 11/12/18  2:47 AM  Result Value Ref Range   Prothrombin Time 14.3 11.4 - 15.2 seconds   INR 1.1 0.8 - 1.2    Comment: (NOTE) INR goal varies based on device and disease states. Performed at [facility], [place] [address], [place], [place] [address]   APTT     Status: None   Collection Time: 11/12/18  2:47 AM  Result Value Ref Range   aPTT 24 24 - 36 seconds    Comment: Performed at [facility] [address] [place], [place] [address]  Prepare RBC     Status: None   Collection Time: 11/12/18  3:30 AM  Result Value Ref Range   Order Confirmation      ORDER PROCESSED BY BLOOD BANK Performed at [facility], [place] [address] [place], [place] [address]   CBC     Status: Abnormal   Collection Time: 11/12/18  6:12 AM  Result Value Ref Range   WBC 9.8 4.0 - 10.5 K/uL   RBC 3.82 (L) 3.87 - 5.11 MIL/uL   Hemoglobin 11.8 (L) 12.0 - 15.0 g/dL   HCT 35.8 (L) 36.0 - 46.0 %   MCV 93.7 80.0 - 100.0 fL   MCH 30.9 26.0 - 34.0 pg   MCHC 33.0 30.0 - 36.0 g/dL   RDW 16.8 (H) 11.5 - 15.5 %   Platelets 224 150 - 400 K/uL   nRBC 0.0 0.0 - 0.2 %    Comment: Performed at [facility] [address], [place], [place] [id number]  Basic metabolic panel     Status: Abnormal   Collection Time: 11/12/18  6:12 AM  Result Value Ref Range   Sodium 136 135 - 145 mmol/L   Potassium 4.1 3.5 - 5.1 mmol/L   Chloride 104 98 - 111 mmol/L   CO2 21 (L) 22 - 32  mmol/L   Glucose, Bld 133 (H) 70 - 99 mg/dL   BUN 16 8 - 23 mg/dL   Creatinine, Ser 0.77 0.44 - 1.00 mg/dL   Calcium 9.4 8.9 - 10.3 mg/dL   GFR calc non Af Amer >60 >60 mL/min   GFR calc Af Amer >60 >60 mL/min   Anion gap 11 5 - 15    Comment: Performed at [facility] [address] [place], [place] [address]  CBG monitoring, ED     Status: Abnormal  Collection Time: 11/12/18  7:55 AM  Result Value Ref Range   Glucose-Capillary 125 (H) 70 - 99 mg/dL   Dg Ankle 2 Views Left  Result Date: 11/11/2018 CLINICAL DATA:  78 year old female status post fall. Dizziness. Pain and swelling. EXAM: LEFT ANKLE - 2 VIEW COMPARISON:  Left foot series reported separately. FINDINGS: Preserved mortise joint alignment. Bone mineralization is within normal limits. Talar dome intact. No evidence of joint effusion. Distal tibia, fibula and calcaneus appear intact. Calcaneus and midfoot degenerative spurring again noted. Fracture at the base of the left 5th metatarsal redemonstrated. Mild vascular calcifications in the distal left leg. IMPRESSION: 1. No acute fracture or dislocation identified about the left ankle. 2. Fracture at the base of the left 5th metatarsal re-demonstrated. Electronically Signed   By: Genevie Ann M.D.   On: 11/11/2018 22:11   Ct Abdomen Pelvis W Contrast  Result Date: 11/12/2018 CLINICAL DATA:  Abdominal pain and rectal bleeding EXAM: CT ABDOMEN AND PELVIS WITH CONTRAST TECHNIQUE: Multidetector CT imaging of the abdomen and pelvis was performed using the standard protocol following bolus administration of intravenous contrast. CONTRAST:  170mL OMNIPAQUE IOHEXOL 300 MG/ML  SOLN COMPARISON:  10/14/2018 FINDINGS: Lower chest: No acute abnormality. Hepatobiliary: Fatty infiltration of the liver is noted. No focal mass is seen. The gallbladder is well distended without focal abnormality. Pancreas: Pancreas is within normal limits. Spleen: Spleen demonstrates multiple calcified granulomas.  Adrenals/Urinary Tract: Adrenal glands are unremarkable. Left kidney is within normal limits. Right kidney demonstrates a large cyst in the upper pole measuring 8 cm in greatest dimension. No obstructive changes are seen. The bladder is decompressed. Stomach/Bowel: Diverticular changes noted within the sigmoid colon without evidence of diverticulitis. Smooth muscle thickening is noted related to the diverticular change. The appendix is within normal limits. No small bowel abnormality is seen. The stomach is unremarkable. Vascular/Lymphatic: Aortic atherosclerosis. No enlarged abdominal or pelvic lymph nodes. Reproductive: Status post hysterectomy. No adnexal masses. Other: No abdominal wall hernia or abnormality. No abdominopelvic ascites. Musculoskeletal: Degenerative changes of the lumbar spine are noted. No acute abnormality is seen. Stable anterolisthesis of L3 on L4 and L4 on L5 is seen. IMPRESSION: Diverticular change with smooth muscle hypertrophy. No findings to suggest diverticulitis are seen. Chronic changes as described above. Electronically Signed   By: Inez Catalina M.D.   On: 11/12/2018 01:37   Dg Foot 2 Views Left  Result Date: 11/11/2018 CLINICAL DATA:  78 year old female status post fall. Dizziness. Pain and swelling. EXAM: LEFT FOOT - 2 VIEW COMPARISON:  None. FINDINGS: Widespread bulky degenerative tarsal bone spurring. Lesser degenerative spurring of the calcaneus. Mildly comminuted but nondisplaced acute fracture at the base of the 5th metatarsal. No other acute fracture identified. IMPRESSION: Mildly comminuted but nondisplaced fracture at the base of the 5th metatarsal. Electronically Signed   By: Genevie Ann M.D.   On: 11/11/2018 22:10   Review of Systems  Constitutional: Negative.  Negative for weight loss.  HENT: Negative.   Eyes: Negative.   Respiratory: Negative.   Cardiovascular: Negative.   Gastrointestinal: Positive for abdominal pain, blood in stool and heartburn. Negative for  constipation, diarrhea, melena, nausea and vomiting.  Genitourinary: Negative.   Musculoskeletal: Negative.   Skin: Negative.   Neurological: Negative.   Endo/Heme/Allergies: Negative.   Psychiatric/Behavioral: Negative.    Blood pressure (!) 116/91, pulse 87, temperature 97.9 F (36.6 C), temperature source Oral, resp. rate 18, height 5\' 7"  (1.702 m), weight 83.5 kg, SpO2 98 %. Physical Exam  Constitutional: She is oriented to person, place, and time. She appears well-developed and well-nourished.  obese  HENT:  Head: Normocephalic and atraumatic.  Eyes: Pupils are equal, round, and reactive to light. Conjunctivae and EOM are normal.  Neck: Normal range of motion. Neck supple.  Cardiovascular: Normal rate and regular rhythm.  Respiratory: Effort normal and breath sounds normal.  GI: Soft. Bowel sounds are normal.  Musculoskeletal: Normal range of motion.  Neurological: She is alert and oriented to person, place, and time.  Skin: Skin is warm and dry.  Psychiatric: She has a normal mood and affect. Her behavior is normal. Judgment and thought content normal.   Assessment/Plan: 1) Rectal bleeding with post-hemorrhagic anemia from a diverticular bleed. Agree with observation for now. Advance her diet as tolerated. 2) GERD. 3) Obesity/Hyperlipdemia/HTN. 4) AODM/Hypothyroidism. 5) Left fifth metatarsal fracture-supportive care.  Juanita Craver 11/12/2018, 9:41 AM

## 2018-11-12 NOTE — H&P (Addendum)
History and Physical    Michele Dawson [id number] DOB: [birth year] DOA: 11/11/2018  Referring MD/NP/PA:   PCP: Kathyrn Lass, MD   Patient coming from:  The patient is coming from home.  At baseline, pt is independent for most of ADL.        Chief Complaint: Rectal bleeding  HPI: Michele Dawson is a 78 y.o. female with medical history significant of diverticulosis, hypertension, hyperlipidemia, diabetes mellitus, GERD, hypothyroidism, who presents with rectal bleeding.  Patient states that she has been having rectal bleeding, at least 6 times of bloody bowel movement, with bright red-colored blood.  She also has left-sided abdominal pain, which is constant, 6 out of 10 in severity, dull, nonradiating.  No nausea and vomiting.  Denies fever or chills.  Patient does not have chest pain, shortness of breath, cough, symptoms of UTI or unilateral weakness.  Per ED physician, while waiting in the ED, patient accidentally fell when she was trying to throw away some trash. She injured her left loot, causing pain in the left foot. No head or neck injury. No LOC. She had diagnostic laparoscopy done approximately 2 weeks ago by Dr. Ninfa Linden which was negative for abnormalities.  ED Course: pt was found to have Hgb dropped from 12.3 on 09/29/18 -->11.4 -->9.8.  WBC 8.5, troponin 10, positive FOBT, negative COVID-19 test, electrolytes renal function okay, temperature normal, blood pressure 130/81, heart rate is 48, 116, 103, oxygen saturation 93 to 97% on room air.  X-ray of left foot showed mildly comminuted but nondisplaced fracture at the base of the 5th metatarsal. Pt is placed on tele bed for obs. Dr. Benson Norway of GI was consulted by EDP.  # CT-abd/pelvis showed: Diverticular change with smooth muscle hypertrophy. No findings to suggest diverticulitis are seen.  Review of Systems:   General: no fevers, chills, no body weight gain, has fatigue HEENT: no blurry vision, hearing changes or sore  throat Respiratory: no dyspnea, coughing, wheezing CV: no chest pain, no palpitations GI: no nausea, vomiting, has abdominal pain, bloody diarrhea and rectal bleeding, no constipation GU: no dysuria, burning on urination, increased urinary frequency, hematuria  Ext: no leg edema Neuro: no unilateral weakness, numbness, or tingling, no vision change or hearing loss Skin: no rash, no skin tear. MSK: No muscle spasm, no deformity, no limitation of range of movement in spin Heme: No easy bruising.  Travel history: No recent long distant travel.  Allergy:  Allergies  Allergen Reactions   Shellfish Allergy Hives   Penicillins Rash    Did it involve swelling of the face/tongue/throat, SOB, or low BP? Yes Did it involve sudden or severe rash/hives, skin peeling, or any reaction on the inside of your mouth or nose? Unk Did you need to seek medical attention at a hospital or doctor's office? Unk When did it last happen?25 years ago If all above answers are "NO", may proceed with cephalosporin use.      Past Medical History:  Diagnosis Date   Arthritis    Diabetes mellitus    Type II   Diverticulosis    Dyspnea    climbing stairs   Family history of malignant neoplasm of gastrointestinal tract    GERD (gastroesophageal reflux disease)    Maalox prn   Hyperlipidemia    Hypertension     Past Surgical History:  Procedure Laterality Date   BREAST BIOPSY Right    2017-2018   CARPAL TUNNEL RELEASE Left    left   COLONOSCOPY  KNEE ARTHROSCOPY Left    LAPAROSCOPY N/A 10/27/2018   Procedure: DIAGNOSTIC LAPAROSCOPCY;  Surgeon: Coralie Keens, MD;  Location: [facility];  Service: General;  Laterality: N/A;   NASAL SEPTUM SURGERY     TOTAL KNEE ARTHROPLASTY Left 2009   left   VAGINAL HYSTERECTOMY     with anterior and posterior vaginal repair    Social History:  reports that she has never smoked. She has never used smokeless tobacco. She reports current alcohol  use of about 7.0 - 8.0 standard drinks of alcohol per week. She reports that she does not use drugs.  Family History:  Family History  Problem Relation Age of Onset   Colon cancer Sister    Breast cancer Mother    Tongue cancer Father    Heart disease Father    Breast cancer Other        aunt x 2     Prior to Admission medications   Medication Sig Start Date End Date Taking? Authorizing Provider  acetaminophen (TYLENOL) 500 MG tablet Take 500-1,000 mg by mouth daily as needed (pain or headaches).    Yes [provider]  amoxicillin (AMOXIL) 500 MG capsule Take 2,000 mg by mouth See admin instructions. Take 2,000 mg by mouth one hour prior to dental appointments 11/10/18  Yes [provider]  aspirin 81 MG tablet Take 81 mg by mouth daily.     Yes [provider]  atorvastatin (LIPITOR) 80 MG tablet Take 80 mg by mouth daily.     Yes [provider]  Cholecalciferol (VITAMIN D-3) 25 MCG (1000 UT) CAPS Take 1,000 Units by mouth daily.    Yes [provider]  Cyanocobalamin (B-12) 5000 MCG CAPS Take 5,000 mcg by mouth daily.   Yes [provider]  diphenhydramine-acetaminophen (TYLENOL PM) 25-500 MG TABS tablet Take 2 tablets by mouth at bedtime.   Yes [provider]  fluticasone (FLONASE) 50 MCG/ACT nasal spray Place 1-2 sprays into both nostrils daily as needed (for seasonal allergies).    Yes [provider]  levothyroxine (SYNTHROID, LEVOTHROID) 125 MCG tablet Take 125 mcg by mouth daily.     Yes [provider]  lisinopril (ZESTRIL) 40 MG tablet Take 40 mg by mouth daily.   Yes [provider]  metFORMIN (GLUCOPHAGE) 500 MG tablet Take 500 mg by mouth daily with breakfast.    Yes [provider]  NON FORMULARY Take 1 capsule by mouth See admin instructions. Metagenics UltraFlora IB capsules: Take 1 capsule by mouth once a day in the morning   Yes [provider]  traMADol  (ULTRAM) 50 MG tablet Take 1 tablet (50 mg total) by mouth every 6 (six) hours as needed for moderate pain or severe pain. 10/28/18  Yes Coralie Keens, MD  amLODipine (NORVASC) 10 MG tablet Take 10 mg by mouth daily. 10/29/18   [provider]  meclizine (ANTIVERT) 25 MG tablet Take 1 every 8 hours if you have dizziness that involves the room spinning around.  Do not take the medicine if you just feel lightheaded Patient taking differently: Take 25 mg by mouth See admin instructions. Take 25 mg by mouth every eight hours if dizziness that involves the room spinning around is present and avoid taking if only lightheadedness is present 09/14/18   Milton Ferguson, MD    Physical Exam: Vitals:   11/12/18 0345 11/12/18 0400 11/12/18 0452 11/12/18 0500  BP: 123/75 121/90 (!) 112/101 120/85  Pulse: 91 81 89  96  Resp: (!) 23 (!) 22 17 20   Temp:   97.7 F (36.5 C)   TempSrc:   Oral   SpO2: 98% 98% 98% 96%  Weight:      Height:       General: Not in acute distress HEENT:       Eyes: PERRL, EOMI, no scleral icterus.       ENT: No discharge from the ears and nose, no pharynx injection, no tonsillar enlargement.        Neck: No JVD, no bruit, no mass felt. Heme: No neck lymph node enlargement. Cardiac: S1/S2, RRR, No murmurs, No gallops or rubs. Respiratory:  No rales, wheezing, rhonchi or rubs. GI: Soft, nondistended, mild tenderness in LLQ, no rebound pain, no organomegaly, BS present. GU: No hematuria Ext: No pitting leg edema bilaterally. 2+DP/PT pulse bilaterally. Musculoskeletal: No joint deformities, No joint redness or warmth, no limitation of ROM in spin. Skin: No rashes.  Neuro: Alert, oriented X3, cranial nerves II-XII grossly intact, moves all extremities normally. Psych: Patient is not psychotic, no suicidal or hemocidal ideation.  Labs on Admission: I have personally reviewed following labs and imaging studies  CBC: Recent Labs  Lab 11/11/18 1329 11/12/18 0222  WBC  8.5 12.5*  NEUTROABS  --  8.4*  HGB 11.4* 9.8*  HCT 36.4 31.0*  MCV 97.6 98.1  PLT 276 [id number]   Basic Metabolic Panel: Recent Labs  Lab 11/11/18 1329  NA 138  K 3.9  CL 105  CO2 21*  GLUCOSE 148*  BUN 17  CREATININE 0.74  CALCIUM 9.7   GFR: Estimated Creatinine Clearance: 65.5 mL/min (by C-G formula based on SCr of 0.74 mg/dL). Liver Function Tests: Recent Labs  Lab 11/11/18 1329  AST 27  ALT 16  ALKPHOS 71  BILITOT 0.7  PROT 7.2  ALBUMIN 3.8   No results for input(s): LIPASE, AMYLASE in the last 168 hours. No results for input(s): AMMONIA in the last 168 hours. Coagulation Profile: Recent Labs  Lab 11/12/18 0247  INR 1.1   Cardiac Enzymes: No results for input(s): CKTOTAL, CKMB, CKMBINDEX, TROPONINI in the last 168 hours. BNP (last 3 results) No results for input(s): PROBNP in the last 8760 hours. HbA1C: No results for input(s): HGBA1C in the last 72 hours. CBG: Recent Labs  Lab 11/12/18 0212  GLUCAP 158*   Lipid Profile: No results for input(s): CHOL, HDL, LDLCALC, TRIG, CHOLHDL, LDLDIRECT in the last 72 hours. Thyroid Function Tests: No results for input(s): TSH, T4TOTAL, FREET4, T3FREE, THYROIDAB in the last 72 hours. Anemia Panel: No results for input(s): VITAMINB12, FOLATE, FERRITIN, TIBC, IRON, RETICCTPCT in the last 72 hours. Urine analysis:    Component Value Date/Time   COLORURINE YELLOW 09/14/2018 1121   APPEARANCEUR CLEAR 09/14/2018 1121   LABSPEC 1.010 09/14/2018 1121   PHURINE 5.0 09/14/2018 1121   GLUCOSEU NEGATIVE 09/14/2018 1121   HGBUR MODERATE (A) 09/14/2018 1121   BILIRUBINUR NEGATIVE 09/14/2018 1121   KETONESUR NEGATIVE 09/14/2018 1121   PROTEINUR NEGATIVE 09/14/2018 1121   UROBILINOGEN 0.2 03/03/2008 1315   NITRITE NEGATIVE 09/14/2018 1121   LEUKOCYTESUR TRACE (A) 09/14/2018 1121   Sepsis Labs: @LABRCNTIP (procalcitonin:4,lacticidven:4) ) Recent Results (from the past 240 hour(s))  SARS Coronavirus 2 [facility] order,  Performed in [facility] hospital lab) Nasopharyngeal Nasopharyngeal Swab     Status: None   Collection Time: 11/12/18  1:51 AM   Specimen: Nasopharyngeal Swab  Result Value Ref Range Status   SARS Coronavirus 2 NEGATIVE NEGATIVE Final  Comment: (NOTE) If result is NEGATIVE SARS-CoV-2 target nucleic acids are NOT DETECTED. The SARS-CoV-2 RNA is generally detectable in upper and lower  respiratory specimens during the acute phase of infection. The lowest  concentration of SARS-CoV-2 viral copies this assay can detect is 250  copies / mL. A negative result does not preclude SARS-CoV-2 infection  and should not be used as the sole basis for treatment or other  patient management decisions.  A negative result may occur with  improper specimen collection / handling, submission of specimen other  than nasopharyngeal swab, presence of viral mutation(s) within the  areas targeted by this assay, and inadequate number of viral copies  (<250 copies / mL). A negative result must be combined with clinical  observations, patient history, and epidemiological information. If result is POSITIVE SARS-CoV-2 target nucleic acids are DETECTED. The SARS-CoV-2 RNA is generally detectable in upper and lower  respiratory specimens dur ing the acute phase of infection.  Positive  results are indicative of active infection with SARS-CoV-2.  Clinical  correlation with patient history and other diagnostic information is  necessary to determine patient infection status.  Positive results do  not rule out bacterial infection or co-infection with other viruses. If result is PRESUMPTIVE POSTIVE SARS-CoV-2 nucleic acids MAY BE PRESENT.   A presumptive positive result was obtained on the submitted specimen  and confirmed on repeat testing.  While 2019 novel coronavirus  (SARS-CoV-2) nucleic acids may be present in the submitted sample  additional confirmatory testing may be necessary for epidemiological  and / or  clinical management purposes  to differentiate between  SARS-CoV-2 and other Sarbecovirus currently known to infect humans.  If clinically indicated additional testing with an alternate test  methodology [phone]) is advised. The SARS-CoV-2 RNA is generally  detectable in upper and lower respiratory sp ecimens during the acute  phase of infection. The expected result is Negative. Fact Sheet for Patients:  StrictlyIdeas.no Fact Sheet for Healthcare Providers: BankingDealers.co.za This test is not yet approved or cleared by the [place] FDA and has been authorized for detection and/or diagnosis of SARS-CoV-2 by FDA under an Emergency Use Authorization (EUA).  This EUA will remain in effect (meaning this test can be used) for the duration of the COVID-19 declaration under Section 564(b)(1) of the Act, 21 U.S.C. section 360bbb-3(b)(1), unless the authorization is terminated or revoked sooner. Performed at [facility], [place] [address], [place], [place] [address]      Radiological Exams on Admission: Dg Ankle 2 Views Left  Result Date: 11/11/2018 CLINICAL DATA:  78 year old female status post fall. Dizziness. Pain and swelling. EXAM: LEFT ANKLE - 2 VIEW COMPARISON:  Left foot series reported separately. FINDINGS: Preserved mortise joint alignment. Bone mineralization is within normal limits. Talar dome intact. No evidence of joint effusion. Distal tibia, fibula and calcaneus appear intact. Calcaneus and midfoot degenerative spurring again noted. Fracture at the base of the left 5th metatarsal redemonstrated. Mild vascular calcifications in the distal left leg. IMPRESSION: 1. No acute fracture or dislocation identified about the left ankle. 2. Fracture at the base of the left 5th metatarsal re-demonstrated. Electronically Signed   By: Genevie Ann M.D.   On: 11/11/2018 22:11   Ct Abdomen Pelvis W Contrast  Result Date: 11/12/2018 CLINICAL  DATA:  Abdominal pain and rectal bleeding EXAM: CT ABDOMEN AND PELVIS WITH CONTRAST TECHNIQUE: Multidetector CT imaging of the abdomen and pelvis was performed using the standard protocol following bolus administration of intravenous contrast.  CONTRAST:  158mL OMNIPAQUE IOHEXOL 300 MG/ML  SOLN COMPARISON:  10/14/2018 FINDINGS: Lower chest: No acute abnormality. Hepatobiliary: Fatty infiltration of the liver is noted. No focal mass is seen. The gallbladder is well distended without focal abnormality. Pancreas: Pancreas is within normal limits. Spleen: Spleen demonstrates multiple calcified granulomas. Adrenals/Urinary Tract: Adrenal glands are unremarkable. Left kidney is within normal limits. Right kidney demonstrates a large cyst in the upper pole measuring 8 cm in greatest dimension. No obstructive changes are seen. The bladder is decompressed. Stomach/Bowel: Diverticular changes noted within the sigmoid colon without evidence of diverticulitis. Smooth muscle thickening is noted related to the diverticular change. The appendix is within normal limits. No small bowel abnormality is seen. The stomach is unremarkable. Vascular/Lymphatic: Aortic atherosclerosis. No enlarged abdominal or pelvic lymph nodes. Reproductive: Status post hysterectomy. No adnexal masses. Other: No abdominal wall hernia or abnormality. No abdominopelvic ascites. Musculoskeletal: Degenerative changes of the lumbar spine are noted. No acute abnormality is seen. Stable anterolisthesis of L3 on L4 and L4 on L5 is seen. IMPRESSION: Diverticular change with smooth muscle hypertrophy. No findings to suggest diverticulitis are seen. Chronic changes as described above. Electronically Signed   By: Inez Catalina M.D.   On: 11/12/2018 01:37   Dg Foot 2 Views Left  Result Date: 11/11/2018 CLINICAL DATA:  78 year old female status post fall. Dizziness. Pain and swelling. EXAM: LEFT FOOT - 2 VIEW COMPARISON:  None. FINDINGS: Widespread bulky degenerative  tarsal bone spurring. Lesser degenerative spurring of the calcaneus. Mildly comminuted but nondisplaced acute fracture at the base of the 5th metatarsal. No other acute fracture identified. IMPRESSION: Mildly comminuted but nondisplaced fracture at the base of the 5th metatarsal. Electronically Signed   By: Genevie Ann M.D.   On: 11/11/2018 22:10     EKG: Independently reviewed.  Sinus rhythm, QTC 446, frequent PVC, poor R wave progression, low voltage.  Assessment/Plan Principal Problem:   Rectal bleeding Active Problems:   HLD (hyperlipidemia)   Hypothyroidism   Toe fracture, left 5th toe   Diabetes mellitus without complication (HCC)   GERD (gastroesophageal reflux disease)   Anemia due to acute blood loss   HTN (hypertension)   Rectal bleeding and anemia due to acute blood loss: possibly due to diverticular bleeding.  Patient has some abdominal tenderness, but CT angiogram only showed diverticulosis without evidence of diverticulitis.  Hemoglobin dropped from 12.3 on 09/29/18 -->11.4 -->9.8. pt continues to pass blood in ED. She is at high risk of having massive bleeding though her current Hgb is 9.8. Dr. Benson Norway of GI was consulted   -will place in tele bed for obs - 1 unit of blood was ordered by EDP, will order another 1 unit of blood - GI consulted by Ed, will follow up recommendations - NPO - IVF: 1L NS bolus, then at 75 mL/hr - Zofran IV for nausea - Avoid NSAIDs and SQ heparin - Maintain IV access (2 large bore IVs if possible). - Monitor closely and follow q6h cbc, transfuse as necessary, if Hgb<7.0 - LaB: INR, PTT and type screen -hold ASA  HLD (hyperlipidemia): -lipitor  Hypothyroidism: -Synthroid  Diabetes mellitus without complication: no 123456 on record. Patient is taking metformin at home.  Blood sugar 148 on the left -SSI -Check A1c  GERD: -protonix  HTN: bp 130/81 -hold amlodipine, lisinopril -IV hydralazine trend  Toe fracture, left 5th toe: -pain control:  prn tramadol and morphine -May need to discuss with Ortho      DVT ppx: SCD  Code Status: Full code Family Communication: None at bed side.   Disposition Plan:  Anticipate discharge back to previous home environment Consults called:  Dr. Benson Norway of GI Admission status: Obs / tele   Date of Service 11/12/2018    [facility]   If 7PM-7AM, please contact night-coverage [email or web address] Password [email or web address] 11/12/2018, 6:10 AM

## 2018-11-12 NOTE — Progress Notes (Signed)
New Admission Note: Patient admitted to room 5M19  From [facility]  Arrival Method: via bed Mental Orientation: Alert and oriented x 4 Telemetry: Initiated per order Assessment: Completed Skin: Intact IV: NS @ 75 in L AC Pain: denies Tubes: N/A Safety Measures: Safety Fall Prevention Plan has been given, discussed and signed Admission: Completed [facility] Orientation: Patient has been orientated to the room, unit and staff.  Family: Daughter Webb Silversmith at the bedside  Orders have been reviewed and implemented. Will continue to monitor the patient. Call light has been placed within reach and bed alarm has been activated.   Dorthey Sawyer, RN

## 2018-11-12 NOTE — ED Notes (Signed)
Per MD Orders, (Dr. Blaine Hamper) Hold second unit of blood unless patient begins to pass blood again.

## 2018-11-13 LAB — CBC
HCT: 30.3 % — ABNORMAL LOW (ref 36.0–46.0)
HCT: 32.5 % — ABNORMAL LOW (ref 36.0–46.0)
Hemoglobin: 10.1 g/dL — ABNORMAL LOW (ref 12.0–15.0)
Hemoglobin: 10.8 g/dL — ABNORMAL LOW (ref 12.0–15.0)
MCH: 31.1 pg (ref 26.0–34.0)
MCH: 30.9 pg (ref 26.0–34.0)
MCHC: 33.3 g/dL (ref 30.0–36.0)
MCHC: 33.2 g/dL (ref 30.0–36.0)
MCV: 93.2 fL (ref 80.0–100.0)
MCV: 93.1 fL (ref 80.0–100.0)
Platelets: 210 10*3/uL (ref 150–400)
Platelets: 221 10*3/uL (ref 150–400)
RBC: 3.25 MIL/uL — ABNORMAL LOW (ref 3.87–5.11)
RBC: 3.49 MIL/uL — ABNORMAL LOW (ref 3.87–5.11)
RDW: 16.9 % — ABNORMAL HIGH (ref 11.5–15.5)
RDW: 16.7 % — ABNORMAL HIGH (ref 11.5–15.5)
WBC: 5.3 10*3/uL (ref 4.0–10.5)
WBC: 6.2 10*3/uL (ref 4.0–10.5)
nRBC: 0 % (ref 0.0–0.2)
nRBC: 0 % (ref 0.0–0.2)

## 2018-11-13 LAB — BASIC METABOLIC PANEL
Anion gap: 10 (ref 5–15)
BUN: 9 mg/dL (ref 8–23)
CO2: 24 mmol/L (ref 22–32)
Calcium: 9.1 mg/dL (ref 8.9–10.3)
Chloride: 109 mmol/L (ref 98–111)
Creatinine, Ser: 0.81 mg/dL (ref 0.44–1.00)
GFR calc Af Amer: >60 mL/min (ref >60)
GFR calc non Af Amer: >60 mL/min (ref >60)
Glucose, Bld: 112 mg/dL — ABNORMAL HIGH (ref 70–99)
Potassium: 3.7 mmol/L (ref 3.5–5.1)
Sodium: 143 mmol/L (ref 135–145)

## 2018-11-13 LAB — GLUCOSE, CAPILLARY
Glucose-Capillary: 135 mg/dL — ABNORMAL HIGH (ref 70–99)
Glucose-Capillary: 110 mg/dL — ABNORMAL HIGH (ref 70–99)
Glucose-Capillary: 135 mg/dL — ABNORMAL HIGH (ref 70–99)
Glucose-Capillary: 126 mg/dL — ABNORMAL HIGH (ref 70–99)
Glucose-Capillary: 193 mg/dL — ABNORMAL HIGH (ref 70–99)

## 2018-11-13 LAB — HEMOGLOBIN A1C
Hgb A1c MFr Bld: 5.8 % — ABNORMAL HIGH (ref 4.8–5.6)
Mean Plasma Glucose: 120 mg/dL

## 2018-11-13 NOTE — Progress Notes (Signed)
Visited with patient while on daily rounds. Patient was in a good mood about being discharged soon. Will provide spiritual care as needed.   Rev. Eligha Bridegroom [place].

## 2018-11-13 NOTE — Progress Notes (Signed)
Patient is refusing bed alarm. RN educated. Will continue to monitor.

## 2018-11-13 NOTE — Progress Notes (Signed)
Subjective: No complaints.  She is uncertain if she passed any further blood with two subsequent bowel movements this morning.  Objective: Vital signs in last 24 hours: Temp:  [97.7 F (36.5 C)-99.1 F (37.3 C)] 98.3 F (36.8 C) (09/17 1257) Pulse Rate:  [77-90] 89 (09/17 1257) Resp:  [16-18] 16 (09/17 0325) BP: (109-155)/(75-94) 109/93 (09/17 1257) SpO2:  [96 %-99 %] 97 % (09/17 0722) Weight:  [82.9 kg] 82.9 kg (09/17 0325) Last BM Date: 11/13/18  Intake/Output from previous day: 09/16 0701 - 09/17 0700 In: 1503 [P.O.:480; I.V.:1023] Out: 0  Intake/Output this shift: No intake/output data recorded.  General appearance: alert and no distress GI: soft, non-tender; bowel sounds normal; no masses,  no organomegaly  Lab Results: Recent Labs    11/12/18 0612 11/12/18 1353 11/13/18 0432  WBC 9.8 7.7 5.3  HGB 11.8* 10.8* 10.1*  HCT 35.8* 33.4* 30.3*  PLT 224 239 210   BMET Recent Labs    11/11/18 1329 11/12/18 0612 11/13/18 0432  NA 138 136 143  K 3.9 4.1 3.7  CL 105 104 109  CO2 21* 21* 24  GLUCOSE 148* 133* 112*  BUN 17 16 9   CREATININE 0.74 0.77 0.81  CALCIUM 9.7 9.4 9.1   LFT Recent Labs    11/11/18 1329  PROT 7.2  ALBUMIN 3.8  AST 27  ALT 16  ALKPHOS 71  BILITOT 0.7   PT/INR Recent Labs    11/12/18 0247  LABPROT 14.3  INR 1.1   Hepatitis Panel No results for input(s): HEPBSAG, HCVAB, HEPAIGM, HEPBIGM in the last 72 hours. C-Diff No results for input(s): CDIFFTOX in the last 72 hours. Fecal Lactopherrin No results for input(s): FECLLACTOFRN in the last 72 hours.  Studies/Results: Dg Ankle 2 Views Left  Result Date: 11/11/2018 CLINICAL DATA:  78 year old female status post fall. Dizziness. Pain and swelling. EXAM: LEFT ANKLE - 2 VIEW COMPARISON:  Left foot series reported separately. FINDINGS: Preserved mortise joint alignment. Bone mineralization is within normal limits. Talar dome intact. No evidence of joint effusion. Distal tibia, fibula  and calcaneus appear intact. Calcaneus and midfoot degenerative spurring again noted. Fracture at the base of the left 5th metatarsal redemonstrated. Mild vascular calcifications in the distal left leg. IMPRESSION: 1. No acute fracture or dislocation identified about the left ankle. 2. Fracture at the base of the left 5th metatarsal re-demonstrated. Electronically Signed   By: Genevie Ann M.D.   On: 11/11/2018 22:11   Ct Abdomen Pelvis W Contrast  Result Date: 11/12/2018 CLINICAL DATA:  Abdominal pain and rectal bleeding EXAM: CT ABDOMEN AND PELVIS WITH CONTRAST TECHNIQUE: Multidetector CT imaging of the abdomen and pelvis was performed using the standard protocol following bolus administration of intravenous contrast. CONTRAST:  142mL OMNIPAQUE IOHEXOL 300 MG/ML  SOLN COMPARISON:  10/14/2018 FINDINGS: Lower chest: No acute abnormality. Hepatobiliary: Fatty infiltration of the liver is noted. No focal mass is seen. The gallbladder is well distended without focal abnormality. Pancreas: Pancreas is within normal limits. Spleen: Spleen demonstrates multiple calcified granulomas. Adrenals/Urinary Tract: Adrenal glands are unremarkable. Left kidney is within normal limits. Right kidney demonstrates a large cyst in the upper pole measuring 8 cm in greatest dimension. No obstructive changes are seen. The bladder is decompressed. Stomach/Bowel: Diverticular changes noted within the sigmoid colon without evidence of diverticulitis. Smooth muscle thickening is noted related to the diverticular change. The appendix is within normal limits. No small bowel abnormality is seen. The stomach is unremarkable. Vascular/Lymphatic: Aortic atherosclerosis. No enlarged abdominal  or pelvic lymph nodes. Reproductive: Status post hysterectomy. No adnexal masses. Other: No abdominal wall hernia or abnormality. No abdominopelvic ascites. Musculoskeletal: Degenerative changes of the lumbar spine are noted. No acute abnormality is seen. Stable  anterolisthesis of L3 on L4 and L4 on L5 is seen. IMPRESSION: Diverticular change with smooth muscle hypertrophy. No findings to suggest diverticulitis are seen. Chronic changes as described above. Electronically Signed   By: Inez Catalina M.D.   On: 11/12/2018 01:37   Dg Foot 2 Views Left  Result Date: 11/11/2018 CLINICAL DATA:  78 year old female status post fall. Dizziness. Pain and swelling. EXAM: LEFT FOOT - 2 VIEW COMPARISON:  None. FINDINGS: Widespread bulky degenerative tarsal bone spurring. Lesser degenerative spurring of the calcaneus. Mildly comminuted but nondisplaced acute fracture at the base of the 5th metatarsal. No other acute fracture identified. IMPRESSION: Mildly comminuted but nondisplaced fracture at the base of the 5th metatarsal. Electronically Signed   By: Genevie Ann M.D.   On: 11/11/2018 22:10    Medications:  Scheduled: . sodium chloride   Intravenous Once  . sodium chloride   Intravenous Once  . diphenhydrAMINE  50 mg Oral QHS   And  . acetaminophen  1,000 mg Oral QHS  . atorvastatin  80 mg Oral Daily  . cholecalciferol  1,000 Units Oral Daily  . insulin aspart  0-5 Units Subcutaneous QHS  . insulin aspart  0-9 Units Subcutaneous TID WC  . levothyroxine  125 mcg Oral Q0600  . pantoprazole  40 mg Oral Q1200  . vitamin B-12  5,000 mcg Oral Daily   Continuous: . sodium chloride 50 mL/hr at 11/13/18 0541    Assessment/Plan: 1) Diverticular bleed. 2) Anemia.   She is hemodynamically stable.  Her stool was examined, but it was not easy to discern if there was blood as there was a significant amount of fluid.  The solid portions of the stool were without blood.  Plan: 1) Advanced to a regular diet. 2) Monitor for one more day.  If she does not have any further bleeding and her HGB is stable, she can be discharged home.  Follow up with Dr. Collene Mares in 2 weeks.  LOS: 1 day   Azlynn Mitnick D 11/13/2018, 1:31 PM

## 2018-11-13 NOTE — Progress Notes (Deleted)
No further hematochezia-hemoglobin stable-she really wants to go home-spoke with Dr. Kyung Rudd to discharge.  See discharge summary for further details.

## 2018-11-13 NOTE — Progress Notes (Signed)
PROGRESS NOTE        PATIENT DETAILS Name: Michele Dawson Age: 78 y.o. Sex: female Date of Birth: [birth year] Admit Date: 11/11/2018 Admitting Physician Evalee Mutton Kristeen Mans, MD [id number], Lattie Haw, MD  Brief Narrative: Patient is a 77 y.o. female with history of HTN, dyslipidemia, DM-2, GERD, diverticulosis-chronic left lower abdominal pain-s/p negative diagnostic laparoscopy on 8/31-presented with painless rectal bleeding with acute blood loss anemia.  High suspicion for diverticular etiology.  See below for further details  Subjective: She had some mild hematochezia this morning-plans were to discharge her home but we will hold her discharge for today.  Assessment/Plan: Lower GI bleeding with acute blood loss anemia: Likely diverticular bleeding-although had a mild episode of hematochezia today-she is clearly improved.  Hemoglobin is stable.  She is required 1 unit of PRBC transfusion so far.  Continue full liquids-no endoscopic evaluation recommended by gastroenterology at this point.  If she remains stable-plans are now for discharge home on 9/18.  Repeat CBC later this afternoon.  Chronic left lower abdominal pain: Apparently has had extensive evaluation in the outpatient setting by gastroenterologist-she even had a diagnostic laparotomy on 8/31 that did not show any obvious abnormalities.  Continue with as needed tramadol.  Dyslipidemia: Continue statin  DM-2: CBGs remain stable-continue SSI  Hypothyroidism: Continue Synthroid  Left fifth metatarsal fracture: Supportive care  Diet: Diet Order            Diet full liquid Room service appropriate? Yes; Fluid consistency: Thin  Diet effective now               DVT Prophylaxis: SCD's  Code Status: Full code   Family Communication: None at bedside  Disposition Plan: Remain inpatient  Barrier to discharge: Resolving hematochezia-awaiting GI evaluation-awaiting repeat CBC later  today.  Antimicrobial agents: Anti-infectives (From admission, onward)   None      Procedures: None  CONSULTS: GI  Time spent: 25 minutes-Greater than 50% of this time was spent in counseling, explanation of diagnosis, planning of further management, and coordination of care.  MEDICATIONS: Scheduled Meds:  sodium chloride   Intravenous Once   sodium chloride   Intravenous Once   diphenhydrAMINE  50 mg Oral QHS   And   acetaminophen  1,000 mg Oral QHS   atorvastatin  80 mg Oral Daily   cholecalciferol  1,000 Units Oral Daily   insulin aspart  0-5 Units Subcutaneous QHS   insulin aspart  0-9 Units Subcutaneous TID WC   levothyroxine  125 mcg Oral Q0600   pantoprazole  40 mg Oral Q1200   vitamin B-12  5,000 mcg Oral Daily   Continuous Infusions:  sodium chloride 50 mL/hr at 11/13/18 0541   PRN Meds:.acetaminophen **OR** acetaminophen, fluticasone, hydrALAZINE, meclizine, morphine injection, ondansetron **OR** ondansetron (ZOFRAN) IV, traMADol   PHYSICAL EXAM: Vital signs: Vitals:   11/12/18 1655 11/12/18 2041 11/13/18 0325 11/13/18 0722  BP: 124/90 121/75 132/81 (!) 155/94  Pulse: 90 79 78 77  Resp: 18 18 16    Temp: 98 F (36.7 C) 98.4 F (36.9 C) 97.7 F (36.5 C) 98.1 F (36.7 C)  TempSrc: Oral Oral Oral Oral  SpO2: 97% 99% 98% 97%  Weight:   82.9 kg   Height:       Filed Weights   11/11/18 2058 11/13/18 0325  Weight: 83.5 kg 82.9 kg   Body  mass index is 28.63 kg/m.   Gen Exam:Alert awake-not in any distress HEENT:atraumatic, normocephalic Chest: B/L clear to auscultation anteriorly CVS:S1S2 regular Abdomen:soft non tender, non distended Extremities:no edema Neurology: Non focal Skin: no rash  I have personally reviewed following labs and imaging studies  LABORATORY DATA: CBC: Recent Labs  Lab 11/11/18 1329 11/12/18 0222 11/12/18 0612 11/12/18 1353 11/13/18 0432  WBC 8.5 12.5* 9.8 7.7 5.3  NEUTROABS  --  8.4*  --   --   --    HGB 11.4* 9.8* 11.8* 10.8* 10.1*  HCT 36.4 31.0* 35.8* 33.4* 30.3*  MCV 97.6 98.1 93.7 92.8 93.2  PLT 276 273 224 239 [id number]    Basic Metabolic Panel: Recent Labs  Lab 11/11/18 1329 11/12/18 0612 11/13/18 0432  NA 138 136 143  K 3.9 4.1 3.7  CL 105 104 109  CO2 21* 21* 24  GLUCOSE 148* 133* 112*  BUN 17 16 9   CREATININE 0.74 0.77 0.81  CALCIUM 9.7 9.4 9.1    GFR: Estimated Creatinine Clearance: 64.4 mL/min (by C-G formula based on SCr of 0.81 mg/dL).  Liver Function Tests: Recent Labs  Lab 11/11/18 1329  AST 27  ALT 16  ALKPHOS 71  BILITOT 0.7  PROT 7.2  ALBUMIN 3.8   No results for input(s): LIPASE, AMYLASE in the last 168 hours. No results for input(s): AMMONIA in the last 168 hours.  Coagulation Profile: Recent Labs  Lab 11/12/18 0247  INR 1.1    Cardiac Enzymes: No results for input(s): CKTOTAL, CKMB, CKMBINDEX, TROPONINI in the last 168 hours.  BNP (last 3 results) No results for input(s): PROBNP in the last 8760 hours.  HbA1C: Recent Labs    11/12/18 0612  HGBA1C 5.8*    CBG: Recent Labs  Lab 11/12/18 0755 11/12/18 1056 11/12/18 1653 11/12/18 2045 11/13/18 0656  GLUCAP 125* 118* 188* 91 110*    Lipid Profile: No results for input(s): CHOL, HDL, LDLCALC, TRIG, CHOLHDL, LDLDIRECT in the last 72 hours.  Thyroid Function Tests: No results for input(s): TSH, T4TOTAL, FREET4, T3FREE, THYROIDAB in the last 72 hours.  Anemia Panel: No results for input(s): VITAMINB12, FOLATE, FERRITIN, TIBC, IRON, RETICCTPCT in the last 72 hours.  Urine analysis:    Component Value Date/Time   COLORURINE YELLOW 09/14/2018 1121   APPEARANCEUR CLEAR 09/14/2018 1121   LABSPEC 1.010 09/14/2018 1121   PHURINE 5.0 09/14/2018 1121   GLUCOSEU NEGATIVE 09/14/2018 1121   HGBUR MODERATE (A) 09/14/2018 1121   BILIRUBINUR NEGATIVE 09/14/2018 1121   KETONESUR NEGATIVE 09/14/2018 1121   PROTEINUR NEGATIVE 09/14/2018 1121   UROBILINOGEN 0.2 03/03/2008 1315    NITRITE NEGATIVE 09/14/2018 1121   LEUKOCYTESUR TRACE (A) 09/14/2018 1121    Sepsis Labs: Lactic Acid, Venous No results found for: LATICACIDVEN  MICROBIOLOGY: Recent Results (from the past 240 hour(s))  SARS Coronavirus 2 [facility] order, Performed in [facility] hospital lab) Nasopharyngeal Nasopharyngeal Swab     Status: None   Collection Time: 11/12/18  1:51 AM   Specimen: Nasopharyngeal Swab  Result Value Ref Range Status   SARS Coronavirus 2 NEGATIVE NEGATIVE Final    Comment: (NOTE) If result is NEGATIVE SARS-CoV-2 target nucleic acids are NOT DETECTED. The SARS-CoV-2 RNA is generally detectable in upper and lower  respiratory specimens during the acute phase of infection. The lowest  concentration of SARS-CoV-2 viral copies this assay can detect is 250  copies / mL. A negative result does not preclude SARS-CoV-2 infection  and should not be used as  the sole basis for treatment or other  patient management decisions.  A negative result may occur with  improper specimen collection / handling, submission of specimen other  than nasopharyngeal swab, presence of viral mutation(s) within the  areas targeted by this assay, and inadequate number of viral copies  (<250 copies / mL). A negative result must be combined with clinical  observations, patient history, and epidemiological information. If result is POSITIVE SARS-CoV-2 target nucleic acids are DETECTED. The SARS-CoV-2 RNA is generally detectable in upper and lower  respiratory specimens dur ing the acute phase of infection.  Positive  results are indicative of active infection with SARS-CoV-2.  Clinical  correlation with patient history and other diagnostic information is  necessary to determine patient infection status.  Positive results do  not rule out bacterial infection or co-infection with other viruses. If result is PRESUMPTIVE POSTIVE SARS-CoV-2 nucleic acids MAY BE PRESENT.   A presumptive positive result was  obtained on the submitted specimen  and confirmed on repeat testing.  While 2019 novel coronavirus  (SARS-CoV-2) nucleic acids may be present in the submitted sample  additional confirmatory testing may be necessary for epidemiological  and / or clinical management purposes  to differentiate between  SARS-CoV-2 and other Sarbecovirus currently known to infect humans.  If clinically indicated additional testing with an alternate test  methodology [phone]) is advised. The SARS-CoV-2 RNA is generally  detectable in upper and lower respiratory sp ecimens during the acute  phase of infection. The expected result is Negative. Fact Sheet for Patients:  StrictlyIdeas.no Fact Sheet for Healthcare Providers: BankingDealers.co.za This test is not yet approved or cleared by the [place] FDA and has been authorized for detection and/or diagnosis of SARS-CoV-2 by FDA under an Emergency Use Authorization (EUA).  This EUA will remain in effect (meaning this test can be used) for the duration of the COVID-19 declaration under Section 564(b)(1) of the Act, 21 U.S.C. section 360bbb-3(b)(1), unless the authorization is terminated or revoked sooner. Performed at [facility], [place] [address], [place],  [address]     RADIOLOGY STUDIES/RESULTS: Dg Ankle 2 Views Left  Result Date: 11/11/2018 CLINICAL DATA:  78 year old female status post fall. Dizziness. Pain and swelling. EXAM: LEFT ANKLE - 2 VIEW COMPARISON:  Left foot series reported separately. FINDINGS: Preserved mortise joint alignment. Bone mineralization is within normal limits. Talar dome intact. No evidence of joint effusion. Distal tibia, fibula and calcaneus appear intact. Calcaneus and midfoot degenerative spurring again noted. Fracture at the base of the left 5th metatarsal redemonstrated. Mild vascular calcifications in the distal left leg. IMPRESSION: 1. No acute fracture or  dislocation identified about the left ankle. 2. Fracture at the base of the left 5th metatarsal re-demonstrated. Electronically Signed   By: Genevie Ann M.D.   On: 11/11/2018 22:11   Ct Abdomen Pelvis W Contrast  Result Date: 11/12/2018 CLINICAL DATA:  Abdominal pain and rectal bleeding EXAM: CT ABDOMEN AND PELVIS WITH CONTRAST TECHNIQUE: Multidetector CT imaging of the abdomen and pelvis was performed using the standard protocol following bolus administration of intravenous contrast. CONTRAST:  111mL OMNIPAQUE IOHEXOL 300 MG/ML  SOLN COMPARISON:  10/14/2018 FINDINGS: Lower chest: No acute abnormality. Hepatobiliary: Fatty infiltration of the liver is noted. No focal mass is seen. The gallbladder is well distended without focal abnormality. Pancreas: Pancreas is within normal limits. Spleen: Spleen demonstrates multiple calcified granulomas. Adrenals/Urinary Tract: Adrenal glands are unremarkable. Left kidney is within normal limits. Right kidney demonstrates a large  cyst in the upper pole measuring 8 cm in greatest dimension. No obstructive changes are seen. The bladder is decompressed. Stomach/Bowel: Diverticular changes noted within the sigmoid colon without evidence of diverticulitis. Smooth muscle thickening is noted related to the diverticular change. The appendix is within normal limits. No small bowel abnormality is seen. The stomach is unremarkable. Vascular/Lymphatic: Aortic atherosclerosis. No enlarged abdominal or pelvic lymph nodes. Reproductive: Status post hysterectomy. No adnexal masses. Other: No abdominal wall hernia or abnormality. No abdominopelvic ascites. Musculoskeletal: Degenerative changes of the lumbar spine are noted. No acute abnormality is seen. Stable anterolisthesis of L3 on L4 and L4 on L5 is seen. IMPRESSION: Diverticular change with smooth muscle hypertrophy. No findings to suggest diverticulitis are seen. Chronic changes as described above. Electronically Signed   By: Inez Catalina  M.D.   On: 11/12/2018 01:37   Ct Abdomen Pelvis W Contrast  Result Date: 10/14/2018 CLINICAL DATA:  Abnormal weight loss with left flank pain. EXAM: CT ABDOMEN AND PELVIS WITH CONTRAST TECHNIQUE: Multidetector CT imaging of the abdomen and pelvis was performed using the standard protocol following bolus administration of intravenous contrast. CONTRAST:  175mL ISOVUE-300 IOPAMIDOL (ISOVUE-300) INJECTION 61% COMPARISON:  04/23/2018 FINDINGS: Lower chest: Coronary artery calcification is evident. Hepatobiliary: No suspicious focal abnormality within the liver parenchyma. There is no evidence for gallstones, gallbladder wall thickening, or pericholecystic fluid. No intrahepatic or extrahepatic biliary dilation. Pancreas: No focal mass lesion. No dilatation of the main duct. No intraparenchymal cyst. No peripancreatic edema. Spleen: Calcified granulomata. Adrenals/Urinary Tract: No adrenal nodule or mass. 7.8 cm simple cyst identified upper pole right kidney. No suspicious enhancing lesion noted in either kidney. No evidence for hydroureter. The urinary bladder appears normal for the degree of distention. Stomach/Bowel: Stomach is unremarkable. No gastric wall thickening. No evidence of outlet obstruction. Duodenum is normally positioned as is the ligament of Treitz. No small bowel wall thickening. No small bowel dilatation. Patient is noted to have 2 separate short segments of small bowel intussusception. This involves jejunal loops of the left upper quadrant with a short segment intussusception measuring 2.4 cm well visualized on coronal image 33 of series 5. A second short segment intussusception measures 1.9 cm in length on image 39 of series 5. There is no evidence for bowel wall thickening, perienteric edema, proximal small bowel dilatation, or pneumatosis at either location. Both sites of intussusception persist on the renal delay imaging performed 2 minutes after the portal venous phase study. The terminal  ileum is normal. The appendix is normal. No gross colonic mass. No colonic wall thickening. Diverticular changes are noted in the left colon without evidence of diverticulitis. Vascular/Lymphatic: There is abdominal aortic atherosclerosis without aneurysm. There is no gastrohepatic or hepatoduodenal ligament lymphadenopathy. No intraperitoneal or retroperitoneal lymphadenopathy. No pelvic sidewall lymphadenopathy. Reproductive: Uterus surgically absent.  There is no adnexal mass. Other: No intraperitoneal free fluid. Musculoskeletal: Old left pubic rami fractures evident. Degenerative changes noted lumbar spine. IMPRESSION: 1. Two short segments of small bowel intussusception identified in jejunal loops of the left upper quadrant. The segments measure 2.4 and 1.9 cm in length, respectively. This finding is considered indeterminate and small bowel intussusception can be transient in adults. Neither site is obstructing as proximal small bowel is nondilated and nearly all of the oral contrast has migrated distal to these positions.There are no associated complicating features to suggest bowel ischemia or small bowel obstruction. No associated perienteric edema. No lead point visible by CT imaging. 2. Left colonic diverticulosis without diverticulitis.  3.  Aortic Atherosclerois (ICD10-170.0) Electronically Signed   By: Misty Stanley M.D.   On: 10/14/2018 16:16   Dg Foot 2 Views Left  Result Date: 11/11/2018 CLINICAL DATA:  78 year old female status post fall. Dizziness. Pain and swelling. EXAM: LEFT FOOT - 2 VIEW COMPARISON:  None. FINDINGS: Widespread bulky degenerative tarsal bone spurring. Lesser degenerative spurring of the calcaneus. Mildly comminuted but nondisplaced acute fracture at the base of the 5th metatarsal. No other acute fracture identified. IMPRESSION: Mildly comminuted but nondisplaced fracture at the base of the 5th metatarsal. Electronically Signed   By: Genevie Ann M.D.   On: 11/11/2018 22:10      LOS: 1 day   Oren Binet, MD  [facility]  If 7PM-7AM, please contact night-coverage  Please page via [email or web address]  Go to [email or web address] and use [facility]'s universal password to access. If you do not have the password, please contact the hospital operator.  Locate the [facility] provider you are looking for under [facility] and page to a number that you can be directly reached. If you still have difficulty reaching the provider, please page the [facility] (Director on Call) for the Hospitalists listed on amion for assistance.  11/13/2018, 10:42 AM

## 2018-11-14 LAB — CBC
HCT: 30.8 % — ABNORMAL LOW (ref 36.0–46.0)
Hemoglobin: 9.8 g/dL — ABNORMAL LOW (ref 12.0–15.0)
MCH: 30.1 pg (ref 26.0–34.0)
MCHC: 31.8 g/dL (ref 30.0–36.0)
MCV: 94.5 fL (ref 80.0–100.0)
Platelets: 218 10*3/uL (ref 150–400)
RBC: 3.26 MIL/uL — ABNORMAL LOW (ref 3.87–5.11)
RDW: 16.4 % — ABNORMAL HIGH (ref 11.5–15.5)
WBC: 6 10*3/uL (ref 4.0–10.5)
nRBC: 0 % (ref 0.0–0.2)

## 2018-11-14 LAB — GLUCOSE, CAPILLARY: Glucose-Capillary: 121 mg/dL — ABNORMAL HIGH (ref 70–99)

## 2018-11-14 NOTE — Discharge Summary (Signed)
PATIENT DETAILS Name: Michele Dawson Age: 78 y.o. Sex: female Date of Birth: 01-26-1941 MRN: [id number]. Admitting Physician: Jonetta Osgood, MD [id number], Lattie Haw, MD  Admit Date: 11/11/2018 Discharge date: 11/14/2018  Recommendations for Outpatient Follow-up:  1. Follow up with PCP in 1-2 weeks 2. Please obtain BMP/CBC in one week 3. Please ensure follow up with GI, Ortho  Admitted From:  Home  Disposition: [place]: No  Equipment/Devices: None  Discharge Condition: Stable  CODE STATUS: FULL CODE  Diet recommendation:  Diet Order            Diet - low sodium heart healthy        Diet Carb Modified        Diet regular Room service appropriate? Yes; Fluid consistency: Thin  Diet effective now               Brief Summary: See H&P, Labs, Consult and Test reports for all details in brief, Patient is a 78 y.o. female with history of HTN, dyslipidemia, DM-2, GERD, diverticulosis-chronic left lower abdominal pain-s/p negative diagnostic laparoscopy on 8/31-presented with painless rectal bleeding with acute blood loss anemia.  High suspicion for diverticular etiology.  See below for further details  Brief Hospital Course: Lower GI bleeding with acute blood loss anemia: Likely diverticular bleeding-has resolved-no further episodes of hematochezia. Hb remains stable. She required 1 unit of PRBC transfusion during this hospital stay. No endoscopic evaluation recommended by gastroenterology  Chronic left lower abdominal pain: Apparently has had extensive evaluation in the outpatient setting by gastroenterologist-she even had a diagnostic laparotomy on 8/31 that did not show any obvious abnormalities.   Dyslipidemia: Continue statin  DM-2: CBGs remain stable-managed with SSI-resume metformin on discharge  Hypothyroidism: Continue Synthroid  Left fifth metatarsal fracture: Supportive care-follow with  orthopedics/PCP  Procedures/Studies: None  Discharge Diagnoses:  Principal Problem:   Rectal bleeding Active Problems:   HLD (hyperlipidemia)   Hypothyroidism   Toe fracture, left 5th toe   Diabetes mellitus without complication (HCC)   GERD (gastroesophageal reflux disease)   Anemia due to acute blood loss   HTN (hypertension)   Discharge Instructions:  Activity:  As tolerated   Discharge Instructions    Diet - low sodium heart healthy   Complete by: As directed    Diet Carb Modified   Complete by: As directed    Discharge instructions   Complete by: As directed    Follow with Primary MD  Kathyrn Lass, MD in 1-2 weeks  Follow with Dr Collene Mares in 1-2 weeks  Follow with Dr Farley Ly in 1-2 weeks  You had Gastrointestinal Bleeding: Please ask your Primary MD to check a complete blood count within one week of discharge or at your next visit. Your endoscopic/colonoscopic biopsies that are pending at the time of discharge, will also need to followed by your Primary MD or by your primary gastroenterologist  Please get a complete blood count and chemistry panel checked by your Primary MD at your next visit, and again as instructed by your Primary MD.  Get Medicines reviewed and adjusted: Please take all your medications with you for your next visit with your Primary MD  Laboratory/radiological data: Please request your Primary MD to go over all hospital tests and procedure/radiological results at the follow up, please ask your Primary MD to get all Hospital records sent to his/her office.  In some cases, they will be blood work, cultures and biopsy results pending at the time of  your discharge. Please request that your primary care M.D. follows up on these results.  Also Note the following: If you experience worsening of your admission symptoms, develop shortness of breath, life threatening emergency, suicidal or homicidal thoughts you must seek medical attention  immediately by calling 911 or calling your MD immediately  if symptoms less severe.  You must read complete instructions/literature along with all the possible adverse reactions/side effects for all the Medicines you take and that have been prescribed to you. Take any new Medicines after you have completely understood and accpet all the possible adverse reactions/side effects.   Do not drive when taking Pain medications or sleeping medications (Benzodaizepines)  Do not take more than prescribed Pain, Sleep and Anxiety Medications. It is not advisable to combine anxiety,sleep and pain medications without talking with your primary care practitioner  Special Instructions: If you have smoked or chewed Tobacco  in the last 2 yrs please stop smoking, stop any regular Alcohol  and or any Recreational drug use.  Wear Seat belts while driving.  Please note: You were cared for by a hospitalist during your hospital stay. Once you are discharged, your primary care physician will handle any further medical issues. Please note that NO REFILLS for any discharge medications will be authorized once you are discharged, as it is imperative that you return to your primary care physician (or establish a relationship with a primary care physician if you do not have one) for your post hospital discharge needs so that they can reassess your need for medications and monitor your lab values.   Increase activity slowly   Complete by: As directed      Allergies as of 11/14/2018      Reactions   Shellfish Allergy Hives   Penicillins Rash   Did it involve swelling of the face/tongue/throat, SOB, or low BP? Yes Did it involve sudden or severe rash/hives, skin peeling, or any reaction on the inside of your mouth or nose? Unk Did you need to seek medical attention at a hospital or doctor's office? Unk When did it last happen?25 years ago If all above answers are "NO", may proceed with cephalosporin use.      Medication  List    STOP taking these medications   aspirin 81 MG tablet     TAKE these medications   acetaminophen 500 MG tablet Commonly known as: TYLENOL Take 500-1,000 mg by mouth daily as needed (pain or headaches).   amLODipine 10 MG tablet Commonly known as: NORVASC Take 10 mg by mouth daily.   amoxicillin 500 MG capsule Commonly known as: AMOXIL Take 2,000 mg by mouth See admin instructions. Take 2,000 mg by mouth one hour prior to dental appointments   atorvastatin 80 MG tablet Commonly known as: LIPITOR Take 80 mg by mouth daily.   B-12 5000 MCG Caps Take 5,000 mcg by mouth daily.   diphenhydramine-acetaminophen 25-500 MG Tabs tablet Commonly known as: TYLENOL PM Take 2 tablets by mouth at bedtime.   fluticasone 50 MCG/ACT nasal spray Commonly known as: FLONASE Place 1-2 sprays into both nostrils daily as needed (for seasonal allergies).   levothyroxine 125 MCG tablet Commonly known as: SYNTHROID Take 125 mcg by mouth daily.   lisinopril 40 MG tablet Commonly known as: ZESTRIL Take 40 mg by mouth daily.   meclizine 25 MG tablet Commonly known as: ANTIVERT Take 1 every 8 hours if you have dizziness that involves the room spinning around.  Do not take the medicine  if you just feel lightheaded What changed:   how much to take  how to take this  when to take this  additional instructions   metFORMIN 500 MG tablet Commonly known as: GLUCOPHAGE Take 500 mg by mouth daily with breakfast.   NON FORMULARY Take 1 capsule by mouth See admin instructions. Met[employer]ics UltraFlora IB capsules: Take 1 capsule by mouth once a day in the morning   traMADol 50 MG tablet Commonly known as: ULTRAM Take 1 tablet (50 mg total) by mouth every 6 (six) hours as needed for moderate pain or severe pain.   Vitamin D-3 25 MCG (1000 UT) Caps Take 1,000 Units by mouth daily.      Follow-up Information    Kathyrn Lass, MD. Schedule an appointment as soon as possible for a visit  in 1 week(s).   Specialty: Family Medicine Contact information: [place] [address] [phone]        Newt Minion, MD. Schedule an appointment as soon as possible for a visit in 1 week(s).   Specialty: Orthopedic Surgery Contact information: [place] [address] [phone]        Juanita Craver, MD. Schedule an appointment as soon as possible for a visit in 1 week(s).   Specialty: Gastroenterology Contact information: [address] [place] [address] [phone]          Allergies  Allergen Reactions   Shellfish Allergy Hives   Penicillins Rash    Did it involve swelling of the face/tongue/throat, SOB, or low BP? Yes Did it involve sudden or severe rash/hives, skin peeling, or any reaction on the inside of your mouth or nose? Unk Did you need to seek medical attention at a hospital or doctor's office? Unk When did it last happen?25 years ago If all above answers are "NO", may proceed with cephalosporin use.      Consultations:   GI   Other Procedures/Studies: Dg Ankle 2 Views Left  Result Date: 11/11/2018 CLINICAL DATA:  78 year old female status post fall. Dizziness. Pain and swelling. EXAM: LEFT ANKLE - 2 VIEW COMPARISON:  Left foot series reported separately. FINDINGS: Preserved mortise joint alignment. Bone mineralization is within normal limits. Talar dome intact. No evidence of joint effusion. Distal tibia, fibula and calcaneus appear intact. Calcaneus and midfoot degenerative spurring again noted. Fracture at the base of the left 5th metatarsal redemonstrated. Mild vascular calcifications in the distal left leg. IMPRESSION: 1. No acute fracture or dislocation identified about the left ankle. 2. Fracture at the base of the left 5th metatarsal re-demonstrated. Electronically Signed   By: Genevie Ann M.D.   On: 11/11/2018 22:11   Ct Abdomen Pelvis W Contrast  Result Date:  11/12/2018 CLINICAL DATA:  Abdominal pain and rectal bleeding EXAM: CT ABDOMEN AND PELVIS WITH CONTRAST TECHNIQUE: Multidetector CT imaging of the abdomen and pelvis was performed using the standard protocol following bolus administration of intravenous contrast. CONTRAST:  145mL OMNIPAQUE IOHEXOL 300 MG/ML  SOLN COMPARISON:  10/14/2018 FINDINGS: Lower chest: No acute abnormality. Hepatobiliary: Fatty infiltration of the liver is noted. No focal mass is seen. The gallbladder is well distended without focal abnormality. Pancreas: Pancreas is within normal limits. Spleen: Spleen demonstrates multiple calcified granulomas. Adrenals/Urinary Tract: Adrenal glands are unremarkable. Left kidney is within normal limits. Right kidney demonstrates a large cyst in the upper pole measuring 8 cm in greatest dimension. No obstructive changes are seen. The bladder is decompressed. Stomach/Bowel: Diverticular changes  noted within the sigmoid colon without evidence of diverticulitis. Smooth muscle thickening is noted related to the diverticular change. The appendix is within normal limits. No small bowel abnormality is seen. The stomach is unremarkable. Vascular/Lymphatic: Aortic atherosclerosis. No enlarged abdominal or pelvic lymph nodes. Reproductive: Status post hysterectomy. No adnexal masses. Other: No abdominal wall hernia or abnormality. No abdominopelvic ascites. Musculoskeletal: Degenerative changes of the lumbar spine are noted. No acute abnormality is seen. Stable anterolisthesis of L3 on L4 and L4 on L5 is seen. IMPRESSION: Diverticular change with smooth muscle hypertrophy. No findings to suggest diverticulitis are seen. Chronic changes as described above. Electronically Signed   By: Inez Catalina M.D.   On: 11/12/2018 01:37   Dg Foot 2 Views Left  Result Date: 11/11/2018 CLINICAL DATA:  78 year old female status post fall. Dizziness. Pain and swelling. EXAM: LEFT FOOT - 2 VIEW COMPARISON:  None. FINDINGS:  Widespread bulky degenerative tarsal bone spurring. Lesser degenerative spurring of the calcaneus. Mildly comminuted but nondisplaced acute fracture at the base of the 5th metatarsal. No other acute fracture identified. IMPRESSION: Mildly comminuted but nondisplaced fracture at the base of the 5th metatarsal. Electronically Signed   By: Genevie Ann M.D.   On: 11/11/2018 22:10     TODAY-DAY OF DISCHARGE:  Subjective:   Michele Dawson today has no headache,no chest abdominal pain,no new weakness tingling or numbness, feels much better wants to go home today.   Objective:   Blood pressure 135/81, pulse 74, temperature 98.2 F (36.8 C), temperature source Oral, resp. rate 16, height 5\' 7"  (1.702 m), weight 82.9 kg, SpO2 98 %.  Intake/Output Summary (Last 24 hours) at 11/14/2018 0919 Last data filed at 11/14/2018 0601 Gross per 24 hour  Intake 1194.24 ml  Output 1150 ml  Net 44.24 ml   Filed Weights   11/11/18 2058 11/13/18 0325  Weight: 83.5 kg 82.9 kg    Exam: Awake Alert, Oriented *3, No new F.N deficits, Normal affect [place].AT,PERRAL Supple Neck,No JVD, No cervical lymphadenopathy appriciated.  Symmetrical Chest wall movement, Good air movement bilaterally, CTAB RRR,No Gallops,Rubs or new Murmurs, No Parasternal Heave +ve B.Sounds, Abd Soft, Non tender, No organomegaly appriciated, No rebound -guarding or rigidity. No Cyanosis, Clubbing or edema, No new Rash or bruise   PERTINENT RADIOLOGIC STUDIES: Dg Ankle 2 Views Left  Result Date: 11/11/2018 CLINICAL DATA:  78 year old female status post fall. Dizziness. Pain and swelling. EXAM: LEFT ANKLE - 2 VIEW COMPARISON:  Left foot series reported separately. FINDINGS: Preserved mortise joint alignment. Bone mineralization is within normal limits. Talar dome intact. No evidence of joint effusion. Distal tibia, fibula and calcaneus appear intact. Calcaneus and midfoot degenerative spurring again noted. Fracture at the base of the left 5th  metatarsal redemonstrated. Mild vascular calcifications in the distal left leg. IMPRESSION: 1. No acute fracture or dislocation identified about the left ankle. 2. Fracture at the base of the left 5th metatarsal re-demonstrated. Electronically Signed   By: Genevie Ann M.D.   On: 11/11/2018 22:11   Ct Abdomen Pelvis W Contrast  Result Date: 11/12/2018 CLINICAL DATA:  Abdominal pain and rectal bleeding EXAM: CT ABDOMEN AND PELVIS WITH CONTRAST TECHNIQUE: Multidetector CT imaging of the abdomen and pelvis was performed using the standard protocol following bolus administration of intravenous contrast. CONTRAST:  159mL OMNIPAQUE IOHEXOL 300 MG/ML  SOLN COMPARISON:  10/14/2018 FINDINGS: Lower chest: No acute abnormality. Hepatobiliary: Fatty infiltration of the liver is noted. No focal mass is seen. The gallbladder is well distended without  focal abnormality. Pancreas: Pancreas is within normal limits. Spleen: Spleen demonstrates multiple calcified granulomas. Adrenals/Urinary Tract: Adrenal glands are unremarkable. Left kidney is within normal limits. Right kidney demonstrates a large cyst in the upper pole measuring 8 cm in greatest dimension. No obstructive changes are seen. The bladder is decompressed. Stomach/Bowel: Diverticular changes noted within the sigmoid colon without evidence of diverticulitis. Smooth muscle thickening is noted related to the diverticular change. The appendix is within normal limits. No small bowel abnormality is seen. The stomach is unremarkable. Vascular/Lymphatic: Aortic atherosclerosis. No enlarged abdominal or pelvic lymph nodes. Reproductive: Status post hysterectomy. No adnexal masses. Other: No abdominal wall hernia or abnormality. No abdominopelvic ascites. Musculoskeletal: Degenerative changes of the lumbar spine are noted. No acute abnormality is seen. Stable anterolisthesis of L3 on L4 and L4 on L5 is seen. IMPRESSION: Diverticular change with smooth muscle hypertrophy. No  findings to suggest diverticulitis are seen. Chronic changes as described above. Electronically Signed   By: Inez Catalina M.D.   On: 11/12/2018 01:37   Dg Foot 2 Views Left  Result Date: 11/11/2018 CLINICAL DATA:  78 year old female status post fall. Dizziness. Pain and swelling. EXAM: LEFT FOOT - 2 VIEW COMPARISON:  None. FINDINGS: Widespread bulky degenerative tarsal bone spurring. Lesser degenerative spurring of the calcaneus. Mildly comminuted but nondisplaced acute fracture at the base of the 5th metatarsal. No other acute fracture identified. IMPRESSION: Mildly comminuted but nondisplaced fracture at the base of the 5th metatarsal. Electronically Signed   By: Genevie Ann M.D.   On: 11/11/2018 22:10     PERTINENT LAB RESULTS: CBC: Recent Labs    11/13/18 1534 11/14/18 0423  WBC 6.2 6.0  HGB 10.8* 9.8*  HCT 32.5* 30.8*  PLT 221 218   CMET CMP     Component Value Date/Time   NA 143 11/13/2018 0432   K 3.7 11/13/2018 0432   CL 109 11/13/2018 0432   CO2 24 11/13/2018 0432   GLUCOSE 112 (H) 11/13/2018 0432   BUN 9 11/13/2018 0432   CREATININE 0.81 11/13/2018 0432   CALCIUM 9.1 11/13/2018 0432   PROT 7.2 11/11/2018 1329   ALBUMIN 3.8 11/11/2018 1329   AST 27 11/11/2018 1329   ALT 16 11/11/2018 1329   ALKPHOS 71 11/11/2018 1329   BILITOT 0.7 11/11/2018 1329   GFRNONAA >60 11/13/2018 0432   GFRAA >60 11/13/2018 0432    GFR Estimated Creatinine Clearance: 64.4 mL/min (by C-G formula based on SCr of 0.81 mg/dL). No results for input(s): LIPASE, AMYLASE in the last 72 hours. No results for input(s): CKTOTAL, CKMB, CKMBINDEX, TROPONINI in the last 72 hours. Invalid input(s): POCBNP No results for input(s): DDIMER in the last 72 hours. Recent Labs    11/12/18 0612  HGBA1C 5.8*   No results for input(s): CHOL, HDL, LDLCALC, TRIG, CHOLHDL, LDLDIRECT in the last 72 hours. No results for input(s): TSH, T4TOTAL, T3FREE, THYROIDAB in the last 72 hours.  Invalid input(s):  FREET3 No results for input(s): VITAMINB12, FOLATE, FERRITIN, TIBC, IRON, RETICCTPCT in the last 72 hours. Coags: Recent Labs    11/12/18 0247  INR 1.1   Microbiology: Recent Results (from the past 240 hour(s))  SARS Coronavirus 2 [facility] order, Performed in [facility] hospital lab) Nasopharyngeal Nasopharyngeal Swab     Status: None   Collection Time: 11/12/18  1:51 AM   Specimen: Nasopharyngeal Swab  Result Value Ref Range Status   SARS Coronavirus 2 NEGATIVE NEGATIVE Final    Comment: (NOTE) If result is NEGATIVE  SARS-CoV-2 target nucleic acids are NOT DETECTED. The SARS-CoV-2 RNA is generally detectable in upper and lower  respiratory specimens during the acute phase of infection. The lowest  concentration of SARS-CoV-2 viral copies this assay can detect is 250  copies / mL. A negative result does not preclude SARS-CoV-2 infection  and should not be used as the sole basis for treatment or other  patient management decisions.  A negative result may occur with  improper specimen collection / handling, submission of specimen other  than nasopharyngeal swab, presence of viral mutation(s) within the  areas targeted by this assay, and inadequate number of viral copies  (<250 copies / mL). A negative result must be combined with clinical  observations, patient history, and epidemiological information. If result is POSITIVE SARS-CoV-2 target nucleic acids are DETECTED. The SARS-CoV-2 RNA is generally detectable in upper and lower  respiratory specimens dur ing the acute phase of infection.  Positive  results are indicative of active infection with SARS-CoV-2.  Clinical  correlation with patient history and other diagnostic information is  necessary to determine patient infection status.  Positive results do  not rule out bacterial infection or co-infection with other viruses. If result is PRESUMPTIVE POSTIVE SARS-CoV-2 nucleic acids MAY BE PRESENT.   A presumptive positive  result was obtained on the submitted specimen  and confirmed on repeat testing.  While 2019 novel coronavirus  (SARS-CoV-2) nucleic acids may be present in the submitted sample  additional confirmatory testing may be necessary for epidemiological  and / or clinical management purposes  to differentiate between  SARS-CoV-2 and other Sarbecovirus currently known to infect humans.  If clinically indicated additional testing with an alternate test  methodology [phone]) is advised. The SARS-CoV-2 RNA is generally  detectable in upper and lower respiratory sp ecimens during the acute  phase of infection. The expected result is Negative. Fact Sheet for Patients:  StrictlyIdeas.no Fact Sheet for Healthcare Providers: BankingDealers.co.za This test is not yet approved or cleared by the [place] FDA and has been authorized for detection and/or diagnosis of SARS-CoV-2 by FDA under an Emergency Use Authorization (EUA).  This EUA will remain in effect (meaning this test can be used) for the duration of the COVID-19 declaration under Section 564(b)(1) of the Act, 21 U.S.C. section 360bbb-3(b)(1), unless the authorization is terminated or revoked sooner. Performed at [facility], [place] [address] [place], [place] [address]     FURTHER DISCHARGE INSTRUCTIONS:  Get Medicines reviewed and adjusted: Please take all your medications with you for your next visit with your Primary MD  Laboratory/radiological data: Please request your Primary MD to go over all hospital tests and procedure/radiological results at the follow up, please ask your Primary MD to get all Hospital records sent to his/her office.  In some cases, they will be blood work, cultures and biopsy results pending at the time of your discharge. Please request that your primary care M.D. goes through all the records of your hospital data and follows up on these results.  Also Note  the following: If you experience worsening of your admission symptoms, develop shortness of breath, life threatening emergency, suicidal or homicidal thoughts you must seek medical attention immediately by calling 911 or calling your MD immediately  if symptoms less severe.  You must read complete instructions/literature along with all the possible adverse reactions/side effects for all the Medicines you take and that have been prescribed to you. Take any new Medicines after you have completely understood  and accpet all the possible adverse reactions/side effects.   Do not drive when taking Pain medications or sleeping medications (Benzodaizepines)  Do not take more than prescribed Pain, Sleep and Anxiety Medications. It is not advisable to combine anxiety,sleep and pain medications without talking with your primary care practitioner  Special Instructions: If you have smoked or chewed Tobacco  in the last 2 yrs please stop smoking, stop any regular Alcohol  and or any Recreational drug use.  Wear Seat belts while driving.  Please note: You were cared for by a hospitalist during your hospital stay. Once you are discharged, your primary care physician will handle any further medical issues. Please note that NO REFILLS for any discharge medications will be authorized once you are discharged, as it is imperative that you return to your primary care physician (or establish a relationship with a primary care physician if you do not have one) for your post hospital discharge needs so that they can reassess your need for medications and monitor your lab values.  Total Time spent coordinating discharge including counseling, education and face to face time equals 35 minutes.  SignedOren Binet 11/14/2018 9:19 AM

## 2018-11-14 NOTE — Progress Notes (Signed)
DISCHARGE NOTE HOME SIMON DEVILLERS to be discharged Home per MD order. Discussed prescriptions and follow up appointments with the patient. Prescriptions given to patient; medication list explained in detail. Patient verbalized understanding.  Skin clean, dry and intact without evidence of skin break down, no evidence of skin tears noted. IV catheter discontinued intact. Site without signs and symptoms of complications. Dressing and pressure applied. Pt denies pain at the site currently. No complaints noted.  Patient free of lines, drains, and wounds.   An After Visit Summary (AVS) was printed and given to the patient. Patient escorted via wheelchair, and discharged home via private auto.  Orville Govern, RN

## 2018-11-15 LAB — TYPE AND SCREEN
ABO/RH(D): O POS
Antibody Screen: NEGATIVE
Unit division: 0
Unit division: 0

## 2018-11-15 LAB — BPAM RBC
Blood Product Expiration Date: 202010212359
Blood Product Expiration Date: 202010212359
ISSUE DATE / TIME: 202009160232
Unit Type and Rh: 5100
Unit Type and Rh: 5100

## 2018-11-17 DIAGNOSIS — S92355D Nondisplaced fracture of fifth metatarsal bone, left foot, subsequent encounter for fracture with routine healing: Secondary | ICD-10-CM | POA: Diagnosis not present

## 2018-11-17 DIAGNOSIS — R634 Abnormal weight loss: Secondary | ICD-10-CM | POA: Diagnosis not present

## 2018-11-17 DIAGNOSIS — K922 Gastrointestinal hemorrhage, unspecified: Secondary | ICD-10-CM | POA: Diagnosis not present

## 2018-11-17 DIAGNOSIS — Z6830 Body mass index (BMI) 30.0-30.9, adult: Secondary | ICD-10-CM | POA: Diagnosis not present

## 2018-11-19 DIAGNOSIS — S92355A Nondisplaced fracture of fifth metatarsal bone, left foot, initial encounter for closed fracture: Secondary | ICD-10-CM | POA: Diagnosis not present

## 2018-11-25 DIAGNOSIS — R634 Abnormal weight loss: Secondary | ICD-10-CM | POA: Diagnosis not present

## 2018-11-25 DIAGNOSIS — K5730 Diverticulosis of large intestine without perforation or abscess without bleeding: Secondary | ICD-10-CM | POA: Diagnosis not present

## 2018-11-25 DIAGNOSIS — Z86010 Personal history of colonic polyps: Secondary | ICD-10-CM | POA: Diagnosis not present

## 2018-11-27 ENCOUNTER — Other Ambulatory Visit: Payer: Self-pay

## 2018-11-27 ENCOUNTER — Ambulatory Visit
Admission: RE | Admit: 2018-11-27 | Discharge: 2018-11-27 | Disposition: A | Discharge status: Home / Self Care | Payer: Medicare Other | Source: Ambulatory Visit | Attending: Obstetrics & Gynecology | Admitting: Obstetrics & Gynecology

## 2018-11-27 DIAGNOSIS — Z1231 Encounter for screening mammogram for malignant neoplasm of breast: Secondary | ICD-10-CM | POA: Diagnosis not present

## 2018-11-28 DIAGNOSIS — S92355A Nondisplaced fracture of fifth metatarsal bone, left foot, initial encounter for closed fracture: Secondary | ICD-10-CM | POA: Diagnosis not present

## 2018-12-05 DIAGNOSIS — S92355A Nondisplaced fracture of fifth metatarsal bone, left foot, initial encounter for closed fracture: Secondary | ICD-10-CM | POA: Diagnosis not present

## 2018-12-18 DIAGNOSIS — M8588 Other specified disorders of bone density and structure, other site: Secondary | ICD-10-CM | POA: Diagnosis not present

## 2018-12-18 DIAGNOSIS — Z6830 Body mass index (BMI) 30.0-30.9, adult: Secondary | ICD-10-CM | POA: Diagnosis not present

## 2018-12-18 DIAGNOSIS — Z124 Encounter for screening for malignant neoplasm of cervix: Secondary | ICD-10-CM | POA: Diagnosis not present

## 2018-12-18 DIAGNOSIS — N958 Other specified menopausal and perimenopausal disorders: Secondary | ICD-10-CM | POA: Diagnosis not present

## 2019-01-01 DIAGNOSIS — S92355A Nondisplaced fracture of fifth metatarsal bone, left foot, initial encounter for closed fracture: Secondary | ICD-10-CM | POA: Diagnosis not present

## 2019-01-15 ENCOUNTER — Other Ambulatory Visit: Payer: Self-pay

## 2019-01-15 DIAGNOSIS — Z20822 Contact with and (suspected) exposure to covid-19: Secondary | ICD-10-CM

## 2019-01-15 DIAGNOSIS — Z20828 Contact with and (suspected) exposure to other viral communicable diseases: Secondary | ICD-10-CM | POA: Diagnosis not present

## 2019-01-17 DIAGNOSIS — H9201 Otalgia, right ear: Secondary | ICD-10-CM | POA: Diagnosis not present

## 2019-01-17 DIAGNOSIS — R6884 Jaw pain: Secondary | ICD-10-CM | POA: Diagnosis not present

## 2019-01-17 LAB — NOVEL CORONAVIRUS, NAA: SARS-CoV-2, NAA: NOT DETECTED

## 2019-02-05 DIAGNOSIS — Z96652 Presence of left artificial knee joint: Secondary | ICD-10-CM | POA: Diagnosis not present

## 2019-02-06 DIAGNOSIS — Z20828 Contact with and (suspected) exposure to other viral communicable diseases: Secondary | ICD-10-CM | POA: Diagnosis not present

## 2019-02-06 DIAGNOSIS — R5383 Other fatigue: Secondary | ICD-10-CM | POA: Diagnosis not present

## 2019-02-06 DIAGNOSIS — J029 Acute pharyngitis, unspecified: Secondary | ICD-10-CM | POA: Diagnosis not present

## 2019-02-07 DIAGNOSIS — Z03818 Encounter for observation for suspected exposure to other biological agents ruled out: Secondary | ICD-10-CM | POA: Diagnosis not present

## 2019-02-07 DIAGNOSIS — Z20828 Contact with and (suspected) exposure to other viral communicable diseases: Secondary | ICD-10-CM | POA: Diagnosis not present

## 2019-02-07 DIAGNOSIS — J029 Acute pharyngitis, unspecified: Secondary | ICD-10-CM | POA: Diagnosis not present

## 2019-02-07 DIAGNOSIS — R5383 Other fatigue: Secondary | ICD-10-CM | POA: Diagnosis not present

## 2019-02-26 ENCOUNTER — Other Ambulatory Visit: Payer: Self-pay

## 2019-02-26 ENCOUNTER — Encounter (INDEPENDENT_AMBULATORY_CARE_PROVIDER_SITE_OTHER): Payer: Self-pay | Admitting: Otolaryngology

## 2019-02-26 ENCOUNTER — Ambulatory Visit (INDEPENDENT_AMBULATORY_CARE_PROVIDER_SITE_OTHER): Payer: Medicare Other | Admitting: Otolaryngology

## 2019-02-26 VITALS — Temp 97.7°F

## 2019-02-26 DIAGNOSIS — J029 Acute pharyngitis, unspecified: Secondary | ICD-10-CM

## 2019-02-26 NOTE — Progress Notes (Signed)
HPI: Michele Dawson is a 78 y.o. female who presents is referred by Kathyrn Lass, MD for evaluation of chronic right-sided throat pain and right neck pain.  She apparently has had this for several months.  It apparently comes and goes as far as intensity.  She also feels pain in the right ear.  Denies any difficulty swallowing although she does have discomfort on the right side when she swallows.  She does not smoke.  No hoarseness. She had previously seen a physician that suggested it might be TMJ.  However the pain does extend down into her neck..  Past Medical History:  Diagnosis Date  . Arthritis   . Diabetes mellitus    Type II  . Diverticulosis   . Dyspnea    climbing stairs  . Family history of malignant neoplasm of gastrointestinal tract   . GERD (gastroesophageal reflux disease)    Maalox prn  . Hyperlipidemia   . Hypertension   . Rectal bleeding 11/12/2018   Past Surgical History:  Procedure Laterality Date  . BREAST BIOPSY Right    2017-2018  . CARPAL TUNNEL RELEASE Left    left  . COLONOSCOPY    . DIAGNOSTIC LAPAROSCOPY  10/27/2018  . KNEE ARTHROSCOPY Left   . LAPAROSCOPY N/A 10/27/2018   Procedure: DIAGNOSTIC LAPAROSCOPCY;  Surgeon: Coralie Keens, MD;  Location: [facility];  Service: General;  Laterality: N/A;  . NASAL SEPTUM SURGERY    . TOTAL KNEE ARTHROPLASTY Left 2009   left  . VAGINAL HYSTERECTOMY     with anterior and posterior vaginal repair   Social History   Socioeconomic History  . Marital status: Divorced    Spouse name: Not on file  . Number of children: Not on file  . Years of education: Not on file  . Highest education level: Not on file  Occupational History  . Occupation: retired  Tobacco Use  . Smoking status: Never Smoker  . Smokeless tobacco: Never Used  Substance and Sexual Activity  . Alcohol use: Yes    Alcohol/week: 7.0 - 8.0 standard drinks    Types: 7 - 8 Standard drinks or equivalent per week  . Drug use: No  . Sexual  activity: Not on file  Other Topics Concern  . Not on file  Social History Narrative  . Not on file   Social Determinants of Health   Financial Resource Strain:   . Difficulty of Paying Living Expenses: Not on file  Food Insecurity:   . Worried About Charity fundraiser in the Last Year: Not on file  . Ran Out of Food in the Last Year: Not on file  Transportation Needs:   . Lack of Transportation (Medical): Not on file  . Lack of Transportation (Non-Medical): Not on file  Physical Activity:   . Days of Exercise per Week: Not on file  . Minutes of Exercise per Session: Not on file  Stress:   . Feeling of Stress : Not on file  Social Connections:   . Frequency of Communication with Friends and Family: Not on file  . Frequency of Social Gatherings with Friends and Family: Not on file  . Attends Religious Services: Not on file  . Active Member of Clubs or Organizations: Not on file  . Attends Archivist Meetings: Not on file  . Marital Status: Not on file   Family History  Problem Relation Age of Onset  . Colon cancer Sister   . Breast cancer Mother   .  Tongue cancer Father   . Heart disease Father   . Breast cancer Other        aunt x 2   Allergies  Allergen Reactions  . Shellfish Allergy Hives  . Sulfa Antibiotics Hives    blotches  . Penicillins Rash    Did it involve swelling of the face/tongue/throat, SOB, or low BP? Yes Did it involve sudden or severe rash/hives, skin peeling, or any reaction on the inside of your mouth or nose? Unk Did you need to seek medical attention at a hospital or doctor's office? Unk When did it last happen?25 years ago If all above answers are "NO", may proceed with cephalosporin use.     Prior to Admission medications   Medication Sig Start Date End Date Taking? Authorizing Provider  acetaminophen (TYLENOL) 500 MG tablet Take 500-1,000 mg by mouth daily as needed (pain or headaches).    Yes [provider]   amLODipine (NORVASC) 10 MG tablet Take 10 mg by mouth daily. 10/29/18  Yes [provider]  amoxicillin (AMOXIL) 500 MG capsule Take 2,000 mg by mouth See admin instructions. Take 2,000 mg by mouth one hour prior to dental appointments 11/10/18  Yes [provider]  atorvastatin (LIPITOR) 80 MG tablet Take 80 mg by mouth daily.     Yes [provider]  Cholecalciferol (VITAMIN D-3) 25 MCG (1000 UT) CAPS Take 1,000 Units by mouth daily.    Yes [provider]  Cyanocobalamin (B-12) 5000 MCG CAPS Take 5,000 mcg by mouth daily.   Yes [provider]  diphenhydramine-acetaminophen (TYLENOL PM) 25-500 MG TABS tablet Take 2 tablets by mouth at bedtime.   Yes [provider]  fluticasone (FLONASE) 50 MCG/ACT nasal spray Place 1-2 sprays into both nostrils daily as needed (for seasonal allergies).    Yes [provider]  levothyroxine (SYNTHROID, LEVOTHROID) 125 MCG tablet Take 125 mcg by mouth daily.     Yes [provider]  lisinopril (ZESTRIL) 40 MG tablet Take 40 mg by mouth daily.   Yes [provider]  meclizine (ANTIVERT) 25 MG tablet Take 1 every 8 hours if you have dizziness that involves the room spinning around.  Do not take the medicine if you just feel lightheaded Patient taking differently: Take 25 mg by mouth See admin instructions. Take 25 mg by mouth every eight hours if dizziness that involves the room spinning around is present and avoid taking if only lightheadedness is present 09/14/18  Yes Milton Ferguson, MD  metFORMIN (GLUCOPHAGE) 500 MG tablet Take 500 mg by mouth daily with breakfast.    Yes [provider]  NON FORMULARY Take 1 capsule by mouth See admin instructions. Met[employer]ics UltraFlora IB capsules: Take 1 capsule by mouth once a day in the morning   Yes [provider]  traMADol (ULTRAM) 50 MG tablet Take 1 tablet (50 mg total) by mouth every 6 (six) hours as needed for moderate pain  or severe pain. 10/28/18  Yes Coralie Keens, MD     Positive ROS: Otherwise negative.  All other systems have been reviewed and were otherwise negative with the exception of those mentioned in the HPI and as above.  Physical Exam: Constitutional: Alert, well-appearing, no acute distress Ears: External ears without lesions or tenderness. Ear canals are clear bilaterally with intact, clear TMs.  Minimal wax buildup with clear TMs bilaterally. Nasal: External nose without lesions. Septum with mild deflection. Clear nasal passages.  Clear middle meatus bilaterally with no  signs of any infection Oral: Lips and gums without lesions. Tongue and palate mucosa without lesions. Posterior oropharynx clear.  Patient is status post tonsillectomy.  Palpation of the base of tongue and tonsil region was benign. Fiberoptic laryngoscopy through the right nostril revealed clear nasopharynx.  Base of tongue vallecula and epiglottis were normal.  AE folds were clear bilaterally.  False and true cords were clear bilaterally with normal mobility.  Both piriform sinuses were clear.  Clear upper airway examination. Neck: No palpable adenopathy or masses.  Patient has slight tenderness on palpation of the right neck although I do not appreciate any palpable masses or adenopathy.  Palpation of the TMJ joint was nondescript and not painful. Respiratory: Breathing comfortably  Skin: No facial/neck lesions or rash noted.  Laryngoscopy  Date/Time: 02/26/2019 5:28 PM Performed by: Rozetta Nunnery, MD Authorized by: Rozetta Nunnery, MD   Consent:    Consent obtained:  Verbal   Consent given by:  Patient   Risks discussed:  Pain Procedure details:    Indications: hoarseness, dysphagia, or aspiration     Medication:  Afrin   Instrument: flexible fiberoptic laryngoscope     Scope location: right nare   Mouth:    Oropharynx: normal     Vallecula: normal     Base of tongue: normal     Epiglottis: normal    Throat:    Right hypopharynx: normal     Left hypopharynx: normal     Pyriform sinus: normal     False vocal cords: normal     True vocal cords: normal   Comments:     Clear upper airway examination on fiberoptic laryngoscopy.    Assessment: Right neck pain and right throat pain I suspect is probably more inflammatory.  Clear upper airway examination.  No evidence of neoplasia or active infection. Symptoms are not totally consistent with TMJ problems.  Plan: Discussed with Barnetta Chapel concerning negative findings and that I feel the best treatment of this would be use of NSAIDs such as Advil or ibuprofen for 5 to 7 days.  She is a little bit hesitant because of history of lower GI bleed when she took aspirin or ibuprofen several years ago. Recommended follow-up if she notices any increasing pain or palpable nodules in her neck.   Radene Journey, MD   CC:

## 2019-03-10 DIAGNOSIS — E119 Type 2 diabetes mellitus without complications: Secondary | ICD-10-CM | POA: Diagnosis not present

## 2019-03-10 DIAGNOSIS — H2513 Age-related nuclear cataract, bilateral: Secondary | ICD-10-CM | POA: Diagnosis not present

## 2019-03-10 DIAGNOSIS — H52223 Regular astigmatism, bilateral: Secondary | ICD-10-CM | POA: Diagnosis not present

## 2019-03-10 DIAGNOSIS — H524 Presbyopia: Secondary | ICD-10-CM | POA: Diagnosis not present

## 2019-03-10 DIAGNOSIS — H5213 Myopia, bilateral: Secondary | ICD-10-CM | POA: Diagnosis not present

## 2019-03-18 ENCOUNTER — Ambulatory Visit: Payer: Medicare Other | Attending: Internal Medicine

## 2019-03-18 DIAGNOSIS — Z23 Encounter for immunization: Secondary | ICD-10-CM | POA: Diagnosis not present

## 2019-03-18 NOTE — Progress Notes (Signed)
   Covid-19 Vaccination Clinic  Name:  Michele Dawson    MRN: [id number] DOB: October 09, 1940  03/18/2019  [name] was observed post Covid-19 immunization for 15 minutes without incidence. She was provided with Vaccine Information Sheet and instruction to access the V-Safe system.   [name] was instructed to call 911 with any severe reactions post vaccine: Marland Kitchen Difficulty breathing  . Swelling of your face and throat  . A fast heartbeat  . A bad rash all over your body  . Dizziness and weakness    Immunizations Administered    Name Date Dose VIS Date Route   Pfizer COVID-19 Vaccine 03/18/2019 11:30 AM 0.3 mL 02/06/2019 Intramuscular   Manufacturer: [place]   Lot: [id number]   [place]: [id number]

## 2019-03-20 DIAGNOSIS — Z471 Aftercare following joint replacement surgery: Secondary | ICD-10-CM | POA: Diagnosis not present

## 2019-03-20 DIAGNOSIS — Z96652 Presence of left artificial knee joint: Secondary | ICD-10-CM | POA: Diagnosis not present

## 2019-03-24 DIAGNOSIS — R748 Abnormal levels of other serum enzymes: Secondary | ICD-10-CM | POA: Diagnosis not present

## 2019-04-08 ENCOUNTER — Ambulatory Visit: Payer: Medicare Other | Attending: Internal Medicine

## 2019-04-08 DIAGNOSIS — Z23 Encounter for immunization: Secondary | ICD-10-CM | POA: Insufficient documentation

## 2019-04-08 NOTE — Progress Notes (Signed)
   Covid-19 Vaccination Clinic  Name:  Michele Dawson    MRN: [id number] DOB: 09-26-1940  04/08/2019  Michele Dawson was observed post Covid-19 immunization for 15 minutes without incidence. She was provided with Vaccine Information Sheet and instruction to access the V-Safe system.   Michele Dawson was instructed to call 911 with any severe reactions post vaccine: Marland Kitchen Difficulty breathing  . Swelling of your face and throat  . A fast heartbeat  . A bad rash all over your body  . Dizziness and weakness    Immunizations Administered    Name Date Dose VIS Date Route   Pfizer COVID-19 Vaccine 04/08/2019  4:18 PM 0.3 mL 02/06/2019 Intramuscular   Manufacturer: [place]   Lot: [id number]   [place]: [id number]

## 2019-04-22 DIAGNOSIS — R519 Headache, unspecified: Secondary | ICD-10-CM | POA: Diagnosis not present

## 2019-04-22 DIAGNOSIS — J029 Acute pharyngitis, unspecified: Secondary | ICD-10-CM | POA: Diagnosis not present

## 2019-04-22 DIAGNOSIS — M1712 Unilateral primary osteoarthritis, left knee: Secondary | ICD-10-CM | POA: Diagnosis not present

## 2019-04-22 DIAGNOSIS — R05 Cough: Secondary | ICD-10-CM | POA: Diagnosis not present

## 2019-05-20 DIAGNOSIS — I10 Essential (primary) hypertension: Secondary | ICD-10-CM | POA: Diagnosis not present

## 2019-05-20 DIAGNOSIS — R519 Headache, unspecified: Secondary | ICD-10-CM | POA: Diagnosis not present

## 2019-06-02 NOTE — Patient Instructions (Addendum)
DUE TO COVID-19 ONLY ONE VISITOR IS ALLOWED TO COME WITH YOU AND STAY IN THE WAITING ROOM ONLY DURING PRE OP AND PROCEDURE DAY OF SURGERY. THE 2 VISITORS MAY VISIT WITH YOU AFTER SURGERY IN YOUR PRIVATE ROOM DURING VISITING HOURS ONLY!  YOU NEED TO HAVE A COVID 19 TEST ON___4/10____ @9 :40_______, THIS TEST MUST BE DONE BEFORE SURGERY, COME  [address], [place] , [address].  ([place]) ONCE YOUR COVID TEST IS COMPLETED, PLEASE BEGIN THE QUARANTINE INSTRUCTIONS AS OUTLINED IN YOUR HANDOUT.                Michele Dawson    Your procedure is scheduled on: 06/10/19   Report to [facility] Main  Entrance   Report to admitting at  7:40 AM     Call this number if you have problems the morning of surgery [phone]   . BRUSH YOUR TEETH MORNING OF SURGERY AND RINSE YOUR MOUTH OUT, NO CHEWING GUM CANDY OR MINTS.  Do not eat food After Midnight.   YOU MAY HAVE CLEAR LIQUIDS FROM MIDNIGHT UNTIL 7:00 AM.   At 7:00 AM Please finish the prescribed Pre-Surgery Gatorade drink.   Nothing by mouth after you finish the Gatorade drink !    Take these medicines the morning of surgery with A SIP OF WATER: Claritin, Levothyroxine, flonase and Tylenol  IF NEEDED  DO NOT TAKE ANY DIABETIC MEDICATIONS DAY OF YOUR SURGERY     How to Manage Your Diabetes Before and After Surgery  Why is it important to control my blood sugar before and after surgery? . Improving blood sugar levels before and after surgery helps healing and can limit problems. . A way of improving blood sugar control is eating a healthy diet by: o  Eating less sugar and carbohydrates o  Increasing activity/exercise o  Talking with your doctor about reaching your blood sugar goals . High blood sugars (greater than 180 mg/dL) can raise your risk of infections and slow your recovery, so you will need to focus on controlling your diabetes during the weeks before surgery. . Make sure that the doctor who  takes care of your diabetes knows about your planned surgery including the date and location.  How do I manage my blood sugar before surgery? . Check your blood sugar at least 4 times a day, starting 2 days before surgery, to make sure that the level is not too high or low. o Check your blood sugar the morning of your surgery when you wake up and every 2 hours until you get to the Short Stay unit. . If your blood sugar is less than 70 mg/dL, you will need to treat for low blood sugar: o Do not take insulin. o Treat a low blood sugar (less than 70 mg/dL) with  cup of clear juice (cranberry or apple), 4 glucose tablets, OR glucose gel. o Recheck blood sugar in 15 minutes after treatment (to make sure it is greater than 70 mg/dL). If your blood sugar is not greater than 70 mg/dL on recheck, call [phone] for further instructions. . Report your blood sugar to the short stay nurse when you get to Short Stay.  . If you are admitted to the hospital after surgery: o Your blood sugar will be checked by the staff and you will probably be given insulin after surgery (instead of oral diabetes medicines) to make sure you have good blood sugar levels. o The goal for blood sugar  control after surgery is 80-180 mg/dL.   WHAT DO I DO ABOUT MY DIABETES MEDICATION?  Marland Kitchen Do not take oral diabetes medicines (pills) the morning of surgery.                        You may not have any metal on your body including hair pins and              piercings  Do not wear jewelry, make-up, lotions, powders or perfumes, deodorant             Do not wear nail polish on your fingernails.  Do not shave  48 hours prior to surgery.     Do not bring valuables to the hospital. [place].  Contacts, dentures or bridgework may not be worn into surgery.     Name and phone number of your driver:  Special Instructions: N/A              Please read over the following fact  sheets you were given: _____________________________________________________________________             [facility] - Preparing for Surgery Before surgery, you can play an important role.   Because skin is not sterile, your skin needs to be as free of germs as possible.   You can reduce the number of germs on your skin by washing with CHG (chlorahexidine gluconate) soap before surgery .  CHG is an antiseptic cleaner which kills germs and bonds with the skin to continue killing germs even after washing. Please DO NOT use if you have an allergy to CHG or antibacterial soaps.   If your skin becomes reddened/irritated stop using the CHG and inform your nurse when you arrive at Short Stay. Do not shave (including legs and underarms) for at least 48 hours prior to the first CHG shower.    Please follow these instructions carefully:  1.  Shower with CHG Soap the night before surgery and the  morning of Surgery.  2.  If you choose to wash your hair, wash your hair first as usual with your  normal  shampoo.  3.  After you shampoo, rinse your hair and body thoroughly to remove the  shampoo.                                        4.  Use CHG as you would any other liquid soap.  You can apply chg directly  to the skin and wash                       Gently with a scrungie or clean washcloth.  5.  Apply the CHG Soap to your body ONLY FROM THE NECK DOWN.   Do not use on face/ open                           Wound or open sores. Avoid contact with eyes, ears mouth and genitals (private parts).                       Wash face,  Genitals (private parts) with your normal soap.  6.  Wash thoroughly, paying special attention to the area where your surgery  will be performed.  7.  Thoroughly rinse your body with warm water from the neck down.  8.  DO NOT shower/wash with your normal soap after using and rinsing off  the CHG Soap.             9.  Pat yourself dry with a clean towel.            10.   Wear clean pajamas.            11.  Place clean sheets on your bed the night of your first shower and do not  sleep with pets. Day of Surgery : Do not apply any lotions/deodorants the morning of surgery.  Please wear clean clothes to the hospital/surgery center.  FAILURE TO FOLLOW THESE INSTRUCTIONS MAY RESULT IN THE CANCELLATION OF YOUR SURGERY PATIENT SIGNATURE_________________________________  NURSE SIGNATURE__________________________________  ________________________________________________________________________   Adam Phenix  An incentive spirometer is a tool that can help keep your lungs clear and active. This tool measures how well you are filling your lungs with each breath. Taking long deep breaths may help reverse or decrease the chance of developing breathing (pulmonary) problems (especially infection) following:  A long period of time when you are unable to move or be active. BEFORE THE PROCEDURE   If the spirometer includes an indicator to show your best effort, your nurse or respiratory therapist will set it to a desired goal.  If possible, sit up straight or lean slightly forward. Try not to slouch.  Hold the incentive spirometer in an upright position. INSTRUCTIONS FOR USE  1. Sit on the edge of your bed if possible, or sit up as far as you can in bed or on a chair. 2. Hold the incentive spirometer in an upright position. 3. Breathe out normally. 4. Place the mouthpiece in your mouth and seal your lips tightly around it. 5. Breathe in slowly and as deeply as possible, raising the piston or the ball toward the top of the column. 6. Hold your breath for 3-5 seconds or for as long as possible. Allow the piston or ball to fall to the bottom of the column. 7. Remove the mouthpiece from your mouth and breathe out normally. 8. Rest for a few seconds and repeat Steps 1 through 7 at least 10 times every 1-2 hours when you are awake. Take your time and take a few  normal breaths between deep breaths. 9. The spirometer may include an indicator to show your best effort. Use the indicator as a goal to work toward during each repetition. 10. After each set of 10 deep breaths, practice coughing to be sure your lungs are clear. If you have an incision (the cut made at the time of surgery), support your incision when coughing by placing a pillow or rolled up towels firmly against it. Once you are able to get out of bed, walk around indoors and cough well. You may stop using the incentive spirometer when instructed by your caregiver.  RISKS AND COMPLICATIONS  Take your time so you do not get dizzy or light-headed.  If you are in pain, you may need to take or ask for pain medication before doing incentive spirometry. It is harder to take a deep breath if you are having pain. AFTER USE  Rest and breathe slowly and easily.  It can be helpful to keep track of a log of your progress. Your  caregiver can provide you with a simple table to help with this. If you are using the spirometer at home, follow these instructions: [place] IF:   You are having difficultly using the spirometer.  You have trouble using the spirometer as often as instructed.  Your pain medication is not giving enough relief while using the spirometer.  You develop fever of 100.5 F (38.1 C) or higher. SEEK IMMEDIATE MEDICAL CARE IF:   You cough up bloody sputum that had not been present before.  You develop fever of 102 F (38.9 C) or greater.  You develop worsening pain at or near the incision site. MAKE SURE YOU:   Understand these instructions.  Will watch your condition.  Will get help right away if you are not doing well or get worse. Document Released: 06/25/2006 Document Revised: 05/07/2011 Document Reviewed: 08/26/2006 ExitCare Patient Information 2014 ExitCare, [place].   ________________________________________________________________________  WHAT IS A  BLOOD TRANSFUSION? Blood Transfusion Information  A transfusion is the replacement of blood or some of its parts. Blood is made up of multiple cells which provide different functions.  Red blood cells carry oxygen and are used for blood loss replacement.  White blood cells fight against infection.  Platelets control bleeding.  Plasma helps clot blood.  Other blood products are available for specialized needs, such as hemophilia or other clotting disorders. BEFORE THE TRANSFUSION  Who gives blood for transfusions?   Healthy volunteers who are fully evaluated to make sure their blood is safe. This is blood bank blood. Transfusion therapy is the safest it has ever been in the practice of medicine. Before blood is taken from a donor, a complete history is taken to make sure that person has no history of diseases nor engages in risky social behavior (examples are intravenous drug use or sexual activity with multiple partners). The donor's travel history is screened to minimize risk of transmitting infections, such as malaria. The donated blood is tested for signs of infectious diseases, such as HIV and hepatitis. The blood is then tested to be sure it is compatible with you in order to minimize the chance of a transfusion reaction. If you or a relative donates blood, this is often done in anticipation of surgery and is not appropriate for emergency situations. It takes many days to process the donated blood. RISKS AND COMPLICATIONS Although transfusion therapy is very safe and saves many lives, the main dangers of transfusion include:   Getting an infectious disease.  Developing a transfusion reaction. This is an allergic reaction to something in the blood you were given. Every precaution is taken to prevent this. The decision to have a blood transfusion has been considered carefully by your caregiver before blood is given. Blood is not given unless the benefits outweigh the risks. AFTER THE  TRANSFUSION  Right after receiving a blood transfusion, you will usually feel much better and more energetic. This is especially true if your red blood cells have gotten low (anemic). The transfusion raises the level of the red blood cells which carry oxygen, and this usually causes an energy increase.  The nurse administering the transfusion will monitor you carefully for complications. HOME CARE INSTRUCTIONS  No special instructions are needed after a transfusion. You may find your energy is better. Speak with your caregiver about any limitations on activity for underlying diseases you may have. SEEK MEDICAL CARE IF:   Your condition is not improving after your transfusion.  You develop redness or irritation at  the intravenous (IV) site. SEEK IMMEDIATE MEDICAL CARE IF:  Any of the following symptoms occur over the next 12 hours:  Shaking chills.  You have a temperature by mouth above 102 F (38.9 C), not controlled by medicine.  Chest, back, or muscle pain.  People around you feel you are not acting correctly or are confused.  Shortness of breath or difficulty breathing.  Dizziness and fainting.  You get a rash or develop hives.  You have a decrease in urine output.  Your urine turns a dark color or changes to pink, red, or brown. Any of the following symptoms occur over the next 10 days:  You have a temperature by mouth above 102 F (38.9 C), not controlled by medicine.  Shortness of breath.  Weakness after normal activity.  The white part of the eye turns yellow (jaundice).  You have a decrease in the amount of urine or are urinating less often.  Your urine turns a dark color or changes to pink, red, or brown. Document Released: 02/10/2000 Document Revised: 05/07/2011 Document Reviewed: 09/29/2007 [facility] Patient Information 2014 [place], [place].  _______________________________________________________________________

## 2019-06-03 ENCOUNTER — Encounter (HOSPITAL_COMMUNITY)
Admission: RE | Admit: 2019-06-03 | Discharge: 2019-06-03 | Disposition: A | Discharge status: Home / Self Care | Payer: Medicare Other | Source: Ambulatory Visit | Attending: Orthopedic Surgery | Admitting: Orthopedic Surgery

## 2019-06-03 ENCOUNTER — Other Ambulatory Visit: Payer: Self-pay

## 2019-06-03 DIAGNOSIS — Z01812 Encounter for preprocedural laboratory examination: Secondary | ICD-10-CM | POA: Insufficient documentation

## 2019-06-03 LAB — PROTIME-INR
INR: 1.1 (ref 0.8–1.2)
Prothrombin Time: 13.8 seconds (ref 11.4–15.2)

## 2019-06-03 LAB — COMPREHENSIVE METABOLIC PANEL
ALT: 19 U/L (ref 0–44)
AST: 28 U/L (ref 15–41)
Albumin: 4.6 g/dL (ref 3.5–5.0)
Alkaline Phosphatase: 70 U/L (ref 38–126)
Anion gap: 9 (ref 5–15)
BUN: 23 mg/dL (ref 8–23)
CO2: 27 mmol/L (ref 22–32)
Calcium: 9.8 mg/dL (ref 8.9–10.3)
Chloride: 105 mmol/L (ref 98–111)
Creatinine, Ser: 0.81 mg/dL (ref 0.44–1.00)
GFR calc Af Amer: >60 mL/min (ref >60)
GFR calc non Af Amer: >60 mL/min (ref >60)
Glucose, Bld: 117 mg/dL — ABNORMAL HIGH (ref 70–99)
Potassium: 4 mmol/L (ref 3.5–5.1)
Sodium: 141 mmol/L (ref 135–145)
Total Bilirubin: 0.9 mg/dL (ref 0.3–1.2)
Total Protein: 7.5 g/dL (ref 6.5–8.1)

## 2019-06-03 LAB — CBC
HCT: 41.4 % (ref 36.0–46.0)
Hemoglobin: 13.5 g/dL (ref 12.0–15.0)
MCH: 32.8 pg (ref 26.0–34.0)
MCHC: 32.6 g/dL (ref 30.0–36.0)
MCV: 100.7 fL — ABNORMAL HIGH (ref 80.0–100.0)
Platelets: 197 10*3/uL (ref 150–400)
RBC: 4.11 MIL/uL (ref 3.87–5.11)
RDW: 12.9 % (ref 11.5–15.5)
WBC: 6.9 10*3/uL (ref 4.0–10.5)
nRBC: 0 % (ref 0.0–0.2)

## 2019-06-03 LAB — SURGICAL PCR SCREEN
MRSA, PCR: NEGATIVE
Staphylococcus aureus: NEGATIVE

## 2019-06-03 LAB — APTT: aPTT: 29 seconds (ref 24–36)

## 2019-06-03 NOTE — H&P (Signed)
TOTAL KNEE REVISION ADMISSION H&P  Patient is being admitted for left knee polyethylene versus total knee arthroplasty revision.  Subjective:  Chief Complaint: Unstable left total knee arthroplasty  HPI: Michele Dawson, 79 y.o. female, presents for pre-operative visit in preparation for their left total knee poly vs total knee arthroplasty revision which is scheduled on 05-06-2019 with Dr. Wynelle Link at [facility]. The patient has had symptoms in the left knee including pain and instability which has impacted their quality of life and ability to do activities of daily living. 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 The patient has had previous arthroplasty with arthroscopy/synovectomy on the left knee. The patient denies an active infection. Review of Systems   Patient Active Problem List   Diagnosis Date Noted  . Rectal bleeding 11/12/2018  . Hypothyroidism 11/12/2018  . Toe fracture, left 5th toe 11/12/2018  . Diabetes mellitus without complication ([place]) [id number]  . Anemia due to acute blood loss 11/12/2018  . HTN (hypertension) 11/12/2018  . Closed nondisplaced fracture of fifth left metatarsal bone   . GERD (gastroesophageal reflux disease)   . Small bowel intussusception ([place]) 10/27/2018  . THYROID DISORDER 04/22/2007  . DM 04/22/2007  . HLD (hyperlipidemia) 04/22/2007  . UNSPECIFIED SINUSITIS 04/22/2007  . DIVERTICULOSIS, COLON 04/22/2007  . ARTHRITIS 04/22/2007  . ELEVATED BLOOD PRESSURE 04/22/2007   Past Medical History:  Diagnosis Date  . Arthritis   . Diabetes mellitus    Type II  . Diverticulosis   . Dyspnea    climbing stairs  . Family history of malignant neoplasm of gastrointestinal tract   . GERD (gastroesophageal reflux disease)    Maalox prn  . Hyperlipidemia   . Hypertension   . Rectal bleeding 11/12/2018    Past Surgical History:    Procedure Laterality Date  . BREAST BIOPSY Right    2017-2018  . CARPAL TUNNEL RELEASE Left    left  . COLONOSCOPY    . DIAGNOSTIC LAPAROSCOPY  10/27/2018  . KNEE ARTHROSCOPY Left   . LAPAROSCOPY N/A 10/27/2018   Procedure: DIAGNOSTIC LAPAROSCOPCY;  Surgeon: Coralie Keens, MD;  Location: [facility];  Service: General;  Laterality: N/A;  . NASAL SEPTUM SURGERY    . TOTAL KNEE ARTHROPLASTY Left 2009   left  . VAGINAL HYSTERECTOMY     with anterior and posterior vaginal repair    No current facility-administered medications for this encounter.   Current Outpatient Medications  Medication Sig Dispense Refill Last Dose  . acetaminophen (TYLENOL) 500 MG tablet Take 500-1,000 mg by mouth daily as needed (pain or headaches).      Marland Kitchen amoxicillin (AMOXIL) 500 MG capsule Take 2,000 mg by mouth See admin instructions. Take 2,000 mg by mouth one hour prior to dental appointments     . atorvastatin (LIPITOR) 80 MG tablet Take 80 mg by mouth daily.       Marland Kitchen BLINK TEARS 0.25 % SOLN Place 1 drop into both eyes 3 (three) times daily as needed (dry/irritated eyes.).     Marland Kitchen Cholecalciferol (VITAMIN D-3) 125 MCG (5000 UT) TABS Take 5,000 Units by mouth daily.     . clobetasol cream (TEMOVATE) [id number] % Apply 1 application topically daily as needed (vulva irritation.).      Marland Kitchen Cyanocobalamin (B-12) 5000 MCG CAPS Take 5,000 mcg by mouth daily.     . diphenhydramine-acetaminophen (TYLENOL PM) 25-500 MG TABS tablet Take 2 tablets by mouth  at bedtime as needed (sleep/pain.).      Marland Kitchen fluticasone (FLONASE) 50 MCG/ACT nasal spray Place 1-2 sprays into both nostrils daily as needed (for seasonal allergies).      Marland Kitchen levothyroxine (SYNTHROID, LEVOTHROID) 125 MCG tablet Take 125 mcg by mouth daily before breakfast.      . lisinopril (ZESTRIL) 40 MG tablet Take 80 mg by mouth daily.      Marland Kitchen loratadine (CLARITIN) 10 MG tablet Take 10 mg by mouth daily as needed for allergies.     . metFORMIN (GLUCOPHAGE) 500 MG tablet Take 500  mg by mouth daily with breakfast.      . polyethylene glycol (MIRALAX / GLYCOLAX) 17 g packet Take 17 g by mouth daily as needed (constipation.).     Marland Kitchen Probiotic Product (PROBIOTIC PO) Take 1 capsule by mouth daily. Met[employer]ics UltraFlora IB     . simethicone (GAS-X EXTRA STRENGTH) 125 MG chewable tablet Chew 250 mg by mouth every 6 (six) hours as needed for flatulence.      Allergies  Allergen Reactions  . Shellfish Allergy Hives  . Sulfa Antibiotics Hives    blotches  . Penicillins Rash    Did it involve swelling of the face/tongue/throat, SOB, or low BP? Yes Did it involve sudden or severe rash/hives, skin peeling, or any reaction on the inside of your mouth or nose? Unk Did you need to seek medical attention at a hospital or doctor's office? Unk When did it last happen?25 years ago If all above answers are "NO", may proceed with cephalosporin use.      Social History   Tobacco Use  . Smoking status: Never Smoker  . Smokeless tobacco: Never Used  Substance Use Topics  . Alcohol use: Yes    Alcohol/week: 7.0 - 8.0 standard drinks    Types: 7 - 8 Standard drinks or equivalent per week    Family History  Problem Relation Age of Onset  . Colon cancer Sister   . Breast cancer Mother   . Tongue cancer Father   . Heart disease Father   . Breast cancer Other        aunt x 2      Review of Systems  Constitutional: Negative for chills and fever.  HENT: Negative for congestion, sore throat and tinnitus.   Eyes: Negative for photophobia and pain.  Respiratory: Negative for cough, shortness of breath and wheezing.   Cardiovascular: Negative for chest pain and palpitations.  Gastrointestinal: Negative for nausea and vomiting.  Genitourinary: Negative for dysuria, frequency and urgency.  Neurological: Negative for dizziness, weakness and headaches.     Objective:  Physical Exam  Well nourished and well developed.  General: Alert and oriented x3, cooperative and pleasant, no  acute distress.  Head: normocephalic, atraumatic, neck supple.  Eyes: EOMI.  Respiratory: breath sounds clear in all fields, no wheezing, rales, or rhonchi. Cardiovascular: Regular rate and rhythm, no murmurs, gallops or rubs.  Abdomen: non-tender to palpation and soft, normoactive bowel sounds. Musculoskeletal:  Left Knee Exam:  No effusion present. No swelling present. The range of motion is: hyper extends to to 3 degrees to 125 degrees.  No crepitus on range of motion of the knee.  No medial joint line tenderness. No lateral joint line tenderness.  She has some varus-valgus laxity in extension and A/P laxity in extension.   Calves soft and nontender. Motor function intact in LE. Strength 5/5 LE bilaterally. Neuro: Distal pulses 2+. Sensation to light touch intact in  LE.  Labs:  Estimated body mass index is 28.63 kg/m as calculated from the following:   Height as of 11/11/18: 5\' 7"  (1.702 m).   Weight as of 11/13/18: 82.9 kg.  Imaging Review Radiographs- AP of the bilateral knees and lateral of the left knee dated 02/05/2019 demonstrate the prosthesis in excellent position with no periprosthetic abnormalities.  Assessment/Plan:  Unstable left total knee arthroplasty  The patient history, physical examination, clinical judgment of the provider and imaging studies are consistent with unstable left total knee arthroplasty. Revision of polyethylene versus total knee arthroplasty revision is deemed medically necessary. The treatment options including medical management, injection therapy, arthroscopy and revision arthroplasty were discussed at length. The risks and benefits of revision total knee arthroplasty were presented and reviewed. The risks due to aseptic loosening, infection, stiffness, patella tracking problems, thromboembolic complications and other imponderables were discussed. The patient acknowledged the explanation, agreed to proceed with the plan and consent was signed.  Patient is being admitted for inpatient treatment for surgery, pain control, PT, OT, prophylactic antibiotics, VTE prophylaxis, progressive ambulation and ADL's and discharge planning.The patient is planning to be discharged home.  Therapy Plans: Outpatient therapy at [facility] Disposition: Home with daughter Planned DVT Prophylaxis: Aspirin 325 mg BID DME Needed: None PCP: Kathyrn Lass, MD TXA: IV Allergies: PCN (rash), shellfish  Anesthesia Concerns: None BMI: 36.8 Last HgbA1c: Unsure. Will recheck with preoperative labs.  - Patient was instructed on what medications to stop prior to surgery. - Follow-up visit in 2 weeks with Dr. Wynelle Link - Begin physical therapy following surgery - Pre-operative lab work as pre-surgical testing - Prescriptions will be provided in hospital at time of discharge  Theresa Duty, PA-C Orthopedic Surgery [facility]

## 2019-06-03 NOTE — Progress Notes (Signed)
PCP - Dr. Garlon Hatchet Cardiologist - no  Chest x-ray - 09/14/18 EKG - 11/12/19 Stress Test - no ECHO - no Cardiac Cath - no  Sleep Study - no CPAP - no  Fasting Blood Sugar - Pt doesn't know Checks Blood Sugar __0___ times a day  Blood Thinner Instructions:NA Aspirin Instructions: Last Dose:  Anesthesia review:   Patient denies shortness of breath, fever, cough and chest pain at PAT appointment  yes Patient verbalized understanding of instructions that were given to them at the PAT appointment. Patient was also instructed that they will need to review over the PAT instructions again at home before surgery. yes

## 2019-06-04 LAB — HEMOGLOBIN A1C
Hgb A1c MFr Bld: 5.7 % — ABNORMAL HIGH (ref 4.8–5.6)
Mean Plasma Glucose: 117 mg/dL

## 2019-06-06 ENCOUNTER — Other Ambulatory Visit (HOSPITAL_COMMUNITY)
Admission: RE | Admit: 2019-06-06 | Discharge: 2019-06-06 | Disposition: A | Discharge status: Home / Self Care | Payer: Medicare Other | Source: Ambulatory Visit | Attending: Orthopedic Surgery | Admitting: Orthopedic Surgery

## 2019-06-06 DIAGNOSIS — Z20822 Contact with and (suspected) exposure to covid-19: Secondary | ICD-10-CM | POA: Diagnosis not present

## 2019-06-06 DIAGNOSIS — Z01812 Encounter for preprocedural laboratory examination: Secondary | ICD-10-CM | POA: Insufficient documentation

## 2019-06-06 LAB — SARS CORONAVIRUS 2 (TAT 6-24 HRS): SARS Coronavirus 2: NEGATIVE

## 2019-06-09 MED ORDER — BUPIVACAINE LIPOSOME 1.3 % IJ SUSP
20.0000 mL | Freq: Once | INTRAMUSCULAR | Status: DC
  Filled 2019-06-09: qty 20

## 2019-06-09 NOTE — Anesthesia Preprocedure Evaluation (Addendum)
Anesthesia Evaluation  Patient identified by MRN, date of birth, ID band Patient awake    Reviewed: Allergy & Precautions, NPO status , Patient's Chart, lab work & pertinent test results  History of Anesthesia Complications Negative for: history of anesthetic complications  Airway Mallampati: I  TM Distance: >3 FB Neck ROM: Full    Dental no notable dental hx.    Pulmonary neg pulmonary ROS,    Pulmonary exam normal        Cardiovascular hypertension, Pt. on medications Normal cardiovascular exam     Neuro/Psych negative neurological ROS  negative psych ROS   GI/Hepatic Neg liver ROS, GERD  Medicated and Controlled,  Endo/Other  diabetes, Type 2, Oral Hypoglycemic AgentsHypothyroidism   Renal/GU negative Renal ROS  negative genitourinary   Musculoskeletal  (+) Arthritis ,   Abdominal   Peds  Hematology negative hematology ROS (+)   Anesthesia Other Findings Day of surgery medications reviewed with patient.  Reproductive/Obstetrics negative OB ROS                            Anesthesia Physical Anesthesia Plan  ASA: II  Anesthesia Plan: Spinal   Post-op Pain Management:  Regional for Post-op pain   Induction:   PONV Risk Score and Plan: 3 and Treatment may vary due to age or medical condition, Ondansetron, Propofol infusion and Dexamethasone  Airway Management Planned: Natural Airway and Simple Face Mask  Additional Equipment: None  Intra-op Plan:   Post-operative Plan:   Informed Consent: I have reviewed the patients History and Physical, chart, labs and discussed the procedure including the risks, benefits and alternatives for the proposed anesthesia with the patient or authorized representative who has indicated his/her understanding and acceptance.       Plan Discussed with: CRNA  Anesthesia Plan Comments:        Anesthesia Quick Evaluation

## 2019-06-10 ENCOUNTER — Inpatient Hospital Stay (HOSPITAL_COMMUNITY): Payer: Medicare Other | Admitting: Certified Registered Nurse Anesthetist

## 2019-06-10 ENCOUNTER — Encounter (HOSPITAL_COMMUNITY)
Admission: RE | Disposition: A | Discharge status: Home / Self Care | Payer: Self-pay | Source: Home / Self Care | Attending: Orthopedic Surgery

## 2019-06-10 ENCOUNTER — Encounter (HOSPITAL_COMMUNITY): Payer: Self-pay | Admitting: Orthopedic Surgery

## 2019-06-10 ENCOUNTER — Inpatient Hospital Stay (HOSPITAL_COMMUNITY): Payer: Medicare Other | Admitting: Physician Assistant

## 2019-06-10 ENCOUNTER — Observation Stay (HOSPITAL_COMMUNITY)
Admission: RE | Admit: 2019-06-10 | Discharge: 2019-06-11 | Disposition: A | Discharge status: Home / Self Care | Payer: Medicare Other | Attending: Orthopedic Surgery | Admitting: Orthopedic Surgery

## 2019-06-10 ENCOUNTER — Other Ambulatory Visit: Payer: Self-pay

## 2019-06-10 DIAGNOSIS — X58XXXA Exposure to other specified factors, initial encounter: Secondary | ICD-10-CM | POA: Diagnosis not present

## 2019-06-10 DIAGNOSIS — T84023A Instability of internal left knee prosthesis, initial encounter: Principal | ICD-10-CM | POA: Insufficient documentation

## 2019-06-10 DIAGNOSIS — Z79899 Other long term (current) drug therapy: Secondary | ICD-10-CM | POA: Diagnosis not present

## 2019-06-10 DIAGNOSIS — E119 Type 2 diabetes mellitus without complications: Secondary | ICD-10-CM | POA: Insufficient documentation

## 2019-06-10 DIAGNOSIS — E039 Hypothyroidism, unspecified: Secondary | ICD-10-CM | POA: Insufficient documentation

## 2019-06-10 DIAGNOSIS — Z96652 Presence of left artificial knee joint: Secondary | ICD-10-CM | POA: Diagnosis not present

## 2019-06-10 DIAGNOSIS — K561 Intussusception: Secondary | ICD-10-CM | POA: Diagnosis not present

## 2019-06-10 DIAGNOSIS — E785 Hyperlipidemia, unspecified: Secondary | ICD-10-CM | POA: Diagnosis not present

## 2019-06-10 DIAGNOSIS — G8918 Other acute postprocedural pain: Secondary | ICD-10-CM | POA: Diagnosis not present

## 2019-06-10 DIAGNOSIS — Z79890 Hormone replacement therapy (postmenopausal): Secondary | ICD-10-CM | POA: Diagnosis not present

## 2019-06-10 DIAGNOSIS — M1990 Unspecified osteoarthritis, unspecified site: Secondary | ICD-10-CM | POA: Insufficient documentation

## 2019-06-10 DIAGNOSIS — Z7984 Long term (current) use of oral hypoglycemic drugs: Secondary | ICD-10-CM | POA: Diagnosis not present

## 2019-06-10 DIAGNOSIS — I10 Essential (primary) hypertension: Secondary | ICD-10-CM | POA: Insufficient documentation

## 2019-06-10 DIAGNOSIS — K219 Gastro-esophageal reflux disease without esophagitis: Secondary | ICD-10-CM | POA: Insufficient documentation

## 2019-06-10 DIAGNOSIS — Y792 Prosthetic and other implants, materials and accessory orthopedic devices associated with adverse incidents: Secondary | ICD-10-CM | POA: Diagnosis not present

## 2019-06-10 DIAGNOSIS — K5790 Diverticulosis of intestine, part unspecified, without perforation or abscess without bleeding: Secondary | ICD-10-CM | POA: Insufficient documentation

## 2019-06-10 HISTORY — PX: TOTAL KNEE REVISION: SHX996

## 2019-06-10 LAB — GLUCOSE, CAPILLARY
Glucose-Capillary: 139 mg/dL — ABNORMAL HIGH (ref 70–99)
Glucose-Capillary: 124 mg/dL — ABNORMAL HIGH (ref 70–99)

## 2019-06-10 LAB — TYPE AND SCREEN
ABO/RH(D): O POS
Antibody Screen: NEGATIVE

## 2019-06-10 SURGERY — TOTAL KNEE REVISION
Anesthesia: Spinal | Site: Knee | Laterality: Left

## 2019-06-10 MED ORDER — PROPOFOL 1000 MG/100ML IV EMUL
INTRAVENOUS | Status: AC
  Filled 2019-06-10: qty 100

## 2019-06-10 MED ORDER — BISACODYL 10 MG RE SUPP: 10.0000 mg | Freq: Every day | RECTAL | Status: DC | PRN

## 2019-06-10 MED ORDER — MENTHOL 3 MG MT LOZG: 1.0000 | LOZENGE | OROMUCOSAL | Status: DC | PRN

## 2019-06-10 MED ORDER — POLYVINYL ALCOHOL 1.4 % OP SOLN
1.0000 [drp] | Freq: Three times a day (TID) | OPHTHALMIC | Status: DC | PRN
  Filled 2019-06-10: qty 15

## 2019-06-10 MED ORDER — MORPHINE SULFATE (PF) 2 MG/ML IV SOLN: 0.5000 mg | INTRAVENOUS | Status: DC | PRN

## 2019-06-10 MED ORDER — ACETAMINOPHEN 500 MG PO TABS: 1000.0000 mg | ORAL_TABLET | Freq: Once | ORAL | Status: DC

## 2019-06-10 MED ORDER — PROMETHAZINE HCL 25 MG/ML IJ SOLN: 6.2500 mg | INTRAMUSCULAR | Status: DC | PRN

## 2019-06-10 MED ORDER — ACETAMINOPHEN 10 MG/ML IV SOLN: 1000.0000 mg | Freq: Once | INTRAVENOUS | Status: DC

## 2019-06-10 MED ORDER — VANCOMYCIN HCL IN DEXTROSE 1-5 GM/200ML-% IV SOLN
1000.0000 mg | Freq: Two times a day (BID) | INTRAVENOUS | Status: AC
  Administered 2019-06-10: 1000 mg via INTRAVENOUS
  Filled 2019-06-10: qty 200

## 2019-06-10 MED ORDER — METOCLOPRAMIDE HCL 5 MG PO TABS: 5.0000 mg | ORAL_TABLET | Freq: Three times a day (TID) | ORAL | Status: DC | PRN

## 2019-06-10 MED ORDER — FLUTICASONE PROPIONATE 50 MCG/ACT NA SUSP
1.0000 | Freq: Every day | NASAL | Status: DC | PRN
  Filled 2019-06-10: qty 16

## 2019-06-10 MED ORDER — STERILE WATER FOR IRRIGATION IR SOLN
Status: DC | PRN
  Administered 2019-06-10: 2000 mL

## 2019-06-10 MED ORDER — ONDANSETRON HCL 4 MG/2ML IJ SOLN
INTRAMUSCULAR | Status: DC | PRN
  Administered 2019-06-10: 4 mg via INTRAVENOUS

## 2019-06-10 MED ORDER — FENTANYL CITRATE (PF) 100 MCG/2ML IJ SOLN
INTRAMUSCULAR | Status: AC
  Filled 2019-06-10: qty 2

## 2019-06-10 MED ORDER — CHLORHEXIDINE GLUCONATE 4 % EX LIQD: 60.0000 mL | Freq: Once | CUTANEOUS | Status: DC

## 2019-06-10 MED ORDER — DEXAMETHASONE SODIUM PHOSPHATE 10 MG/ML IJ SOLN
INTRAMUSCULAR | Status: AC
  Filled 2019-06-10: qty 1

## 2019-06-10 MED ORDER — FENTANYL CITRATE (PF) 100 MCG/2ML IJ SOLN
50.0000 ug | Freq: Once | INTRAMUSCULAR | Status: AC
  Administered 2019-06-10: 50 ug via INTRAVENOUS
  Filled 2019-06-10: qty 2

## 2019-06-10 MED ORDER — OXYCODONE HCL 5 MG PO TABS: 5.0000 mg | ORAL_TABLET | Freq: Once | ORAL | Status: DC | PRN

## 2019-06-10 MED ORDER — LACTATED RINGERS IV SOLN: INTRAVENOUS | Status: DC

## 2019-06-10 MED ORDER — DIPHENHYDRAMINE HCL 12.5 MG/5ML PO ELIX: 12.5000 mg | ORAL_SOLUTION | ORAL | Status: DC | PRN

## 2019-06-10 MED ORDER — DEXAMETHASONE SODIUM PHOSPHATE 10 MG/ML IJ SOLN
8.0000 mg | Freq: Once | INTRAMUSCULAR | Status: AC
  Administered 2019-06-10: 8 mg via INTRAVENOUS

## 2019-06-10 MED ORDER — METHOCARBAMOL 500 MG IVPB - SIMPLE MED
INTRAVENOUS | Status: AC
  Filled 2019-06-10: qty 50

## 2019-06-10 MED ORDER — PHENOL 1.4 % MT LIQD: 1.0000 | OROMUCOSAL | Status: DC | PRN

## 2019-06-10 MED ORDER — METHOCARBAMOL 500 MG IVPB - SIMPLE MED
500.0000 mg | Freq: Four times a day (QID) | INTRAVENOUS | Status: DC | PRN
  Administered 2019-06-10: 500 mg via INTRAVENOUS
  Filled 2019-06-10: qty 50

## 2019-06-10 MED ORDER — METOCLOPRAMIDE HCL 5 MG/ML IJ SOLN: 5.0000 mg | Freq: Three times a day (TID) | INTRAMUSCULAR | Status: DC | PRN

## 2019-06-10 MED ORDER — BUPIVACAINE LIPOSOME 1.3 % IJ SUSP
INTRAMUSCULAR | Status: DC | PRN
  Administered 2019-06-10: 20 mL

## 2019-06-10 MED ORDER — SODIUM CHLORIDE 0.9 % IV SOLN: INTRAVENOUS | Status: DC

## 2019-06-10 MED ORDER — DEXAMETHASONE SODIUM PHOSPHATE 10 MG/ML IJ SOLN
10.0000 mg | Freq: Once | INTRAMUSCULAR | Status: AC
  Administered 2019-06-11: 10 mg via INTRAVENOUS
  Filled 2019-06-10: qty 1

## 2019-06-10 MED ORDER — PROPOFOL 10 MG/ML IV BOLUS
INTRAVENOUS | Status: AC
  Filled 2019-06-10: qty 20

## 2019-06-10 MED ORDER — PROPOFOL 500 MG/50ML IV EMUL
INTRAVENOUS | Status: DC | PRN
  Administered 2019-06-10: 75 ug/kg/min via INTRAVENOUS

## 2019-06-10 MED ORDER — TRAMADOL HCL 50 MG PO TABS: 50.0000 mg | ORAL_TABLET | Freq: Four times a day (QID) | ORAL | Status: DC | PRN

## 2019-06-10 MED ORDER — ONDANSETRON HCL 4 MG PO TABS: 4.0000 mg | ORAL_TABLET | Freq: Four times a day (QID) | ORAL | Status: DC | PRN

## 2019-06-10 MED ORDER — SODIUM CHLORIDE (PF) 0.9 % IJ SOLN
INTRAMUSCULAR | Status: DC | PRN
  Administered 2019-06-10: 60 mL

## 2019-06-10 MED ORDER — BUPIVACAINE-EPINEPHRINE (PF) 0.5% -1:200000 IJ SOLN
INTRAMUSCULAR | Status: DC | PRN
  Administered 2019-06-10: 15 mL via PERINEURAL

## 2019-06-10 MED ORDER — VANCOMYCIN HCL IN DEXTROSE 1-5 GM/200ML-% IV SOLN
1000.0000 mg | INTRAVENOUS | Status: AC
  Administered 2019-06-10: 1000 mg via INTRAVENOUS
  Filled 2019-06-10: qty 200

## 2019-06-10 MED ORDER — MIDAZOLAM HCL 2 MG/2ML IJ SOLN
1.0000 mg | Freq: Once | INTRAMUSCULAR | Status: AC
  Administered 2019-06-10: 1 mg via INTRAVENOUS
  Filled 2019-06-10: qty 2

## 2019-06-10 MED ORDER — SODIUM CHLORIDE (PF) 0.9 % IJ SOLN
INTRAMUSCULAR | Status: AC
  Filled 2019-06-10: qty 10

## 2019-06-10 MED ORDER — POVIDONE-IODINE 10 % EX SWAB: 2.0000 "application " | Freq: Once | CUTANEOUS | Status: DC

## 2019-06-10 MED ORDER — FLEET ENEMA 7-19 GM/118ML RE ENEM: 1.0000 | ENEMA | Freq: Once | RECTAL | Status: DC | PRN

## 2019-06-10 MED ORDER — ONDANSETRON HCL 4 MG/2ML IJ SOLN
INTRAMUSCULAR | Status: AC
  Filled 2019-06-10: qty 2

## 2019-06-10 MED ORDER — PROPOFOL 10 MG/ML IV BOLUS
INTRAVENOUS | Status: DC | PRN
  Administered 2019-06-10: 10 mg via INTRAVENOUS
  Administered 2019-06-10: 140 mg via INTRAVENOUS
  Administered 2019-06-10: 30 mg via INTRAVENOUS

## 2019-06-10 MED ORDER — ATORVASTATIN CALCIUM 40 MG PO TABS
80.0000 mg | ORAL_TABLET | Freq: Every day | ORAL | Status: DC
  Administered 2019-06-11: 80 mg via ORAL
  Filled 2019-06-10: qty 2

## 2019-06-10 MED ORDER — FENTANYL CITRATE (PF) 100 MCG/2ML IJ SOLN
25.0000 ug | INTRAMUSCULAR | Status: DC | PRN
  Administered 2019-06-10 (×2): 50 ug via INTRAVENOUS

## 2019-06-10 MED ORDER — LEVOTHYROXINE SODIUM 125 MCG PO TABS
125.0000 ug | ORAL_TABLET | Freq: Every day | ORAL | Status: DC
  Administered 2019-06-11: 125 ug via ORAL
  Filled 2019-06-10: qty 1

## 2019-06-10 MED ORDER — BUPIVACAINE IN DEXTROSE 0.75-8.25 % IT SOLN
INTRATHECAL | Status: DC | PRN
  Administered 2019-06-10: 1.6 mL via INTRATHECAL

## 2019-06-10 MED ORDER — OXYCODONE HCL 5 MG/5ML PO SOLN: 5.0000 mg | Freq: Once | ORAL | Status: DC | PRN

## 2019-06-10 MED ORDER — 0.9 % SODIUM CHLORIDE (POUR BTL) OPTIME
TOPICAL | Status: DC | PRN
  Administered 2019-06-10: 10:00:00 1000 mL

## 2019-06-10 MED ORDER — ONDANSETRON HCL 4 MG/2ML IJ SOLN: 4.0000 mg | Freq: Four times a day (QID) | INTRAMUSCULAR | Status: DC | PRN

## 2019-06-10 MED ORDER — ASPIRIN EC 325 MG PO TBEC
325.0000 mg | DELAYED_RELEASE_TABLET | Freq: Two times a day (BID) | ORAL | Status: DC
  Administered 2019-06-11: 325 mg via ORAL
  Filled 2019-06-10: qty 1

## 2019-06-10 MED ORDER — POLYETHYLENE GLYCOL 3350 17 G PO PACK: 17.0000 g | PACK | Freq: Every day | ORAL | Status: DC | PRN

## 2019-06-10 MED ORDER — PHENYLEPHRINE HCL-NACL 10-0.9 MG/250ML-% IV SOLN
INTRAVENOUS | Status: DC | PRN
  Administered 2019-06-10: 35 ug/min via INTRAVENOUS

## 2019-06-10 MED ORDER — METHOCARBAMOL 500 MG PO TABS
500.0000 mg | ORAL_TABLET | Freq: Four times a day (QID) | ORAL | Status: DC | PRN
  Administered 2019-06-10: 500 mg via ORAL
  Filled 2019-06-10: qty 1

## 2019-06-10 MED ORDER — TRANEXAMIC ACID-NACL 1000-0.7 MG/100ML-% IV SOLN
1000.0000 mg | INTRAVENOUS | Status: AC
  Administered 2019-06-10: 10:00:00 1000 mg via INTRAVENOUS
  Filled 2019-06-10: qty 100

## 2019-06-10 MED ORDER — SODIUM CHLORIDE 0.9 % IR SOLN
Status: DC | PRN
  Administered 2019-06-10: 1000 mL

## 2019-06-10 MED ORDER — FENTANYL CITRATE (PF) 100 MCG/2ML IJ SOLN
INTRAMUSCULAR | Status: DC | PRN
  Administered 2019-06-10 (×4): 50 ug via INTRAVENOUS

## 2019-06-10 MED ORDER — DOCUSATE SODIUM 100 MG PO CAPS
100.0000 mg | ORAL_CAPSULE | Freq: Two times a day (BID) | ORAL | Status: DC
  Administered 2019-06-10: 100 mg via ORAL
  Filled 2019-06-10 (×2): qty 1

## 2019-06-10 MED ORDER — OXYCODONE HCL 5 MG PO TABS
5.0000 mg | ORAL_TABLET | ORAL | Status: DC | PRN
  Administered 2019-06-10 (×3): 5 mg via ORAL
  Administered 2019-06-11: 10 mg via ORAL
  Administered 2019-06-11: 5 mg via ORAL
  Filled 2019-06-10: qty 2
  Filled 2019-06-10 (×2): qty 1
  Filled 2019-06-10: qty 2
  Filled 2019-06-10: qty 1

## 2019-06-10 MED ORDER — SODIUM CHLORIDE (PF) 0.9 % IJ SOLN
INTRAMUSCULAR | Status: AC
  Filled 2019-06-10: qty 50

## 2019-06-10 MED ORDER — ACETAMINOPHEN 500 MG PO TABS
1000.0000 mg | ORAL_TABLET | Freq: Four times a day (QID) | ORAL | Status: AC
  Administered 2019-06-10 – 2019-06-11 (×4): 1000 mg via ORAL
  Filled 2019-06-10 (×4): qty 2

## 2019-06-10 SURGICAL SUPPLY — 56 items
BAG DECANTER FOR FLEXI CONT (MISCELLANEOUS) ×2 IMPLANT
BAG ZIPLOCK 12X15 (MISCELLANEOUS) IMPLANT
BLADE SAG 18X100X1.27 (BLADE) ×2 IMPLANT
BLADE SAW SGTL 11.0X1.19X90.0M (BLADE) ×2 IMPLANT
BLADE SURG SZ10 CARB STEEL (BLADE) ×4 IMPLANT
BNDG ELASTIC 6X5.8 VLCR STR LF (GAUZE/BANDAGES/DRESSINGS) ×2 IMPLANT
CLOTH BEACON ORANGE TIMEOUT ST (SAFETY) ×2 IMPLANT
CLSR STERI-STRIP ANTIMIC 1/2X4 (GAUZE/BANDAGES/DRESSINGS) ×4 IMPLANT
COVER SURGICAL LIGHT HANDLE (MISCELLANEOUS) ×2 IMPLANT
COVER WAND RF STERILE (DRAPES) ×2 IMPLANT
CUFF TOURN SGL QUICK 34 (TOURNIQUET CUFF) ×2
CUFF TRNQT CYL 34X4.125X (TOURNIQUET CUFF) ×1 IMPLANT
DECANTER SPIKE VIAL GLASS SM (MISCELLANEOUS) ×2 IMPLANT
DRAPE U-SHAPE 47X51 STRL (DRAPES) ×2 IMPLANT
DRESSING AQUACEL AG SP 3.5X10 (GAUZE/BANDAGES/DRESSINGS) ×1 IMPLANT
DRSG ADAPTIC 3X8 NADH LF (GAUZE/BANDAGES/DRESSINGS) ×2 IMPLANT
DRSG AQUACEL AG SP 3.5X10 (GAUZE/BANDAGES/DRESSINGS) ×2
DRSG PAD ABDOMINAL 8X10 ST (GAUZE/BANDAGES/DRESSINGS) ×2 IMPLANT
DURAPREP 26ML APPLICATOR (WOUND CARE) ×2 IMPLANT
ELECT REM PT RETURN 15FT ADLT (MISCELLANEOUS) ×2 IMPLANT
EVACUATOR 1/8 PVC DRAIN (DRAIN) ×2 IMPLANT
GAUZE SPONGE 4X4 12PLY STRL (GAUZE/BANDAGES/DRESSINGS) ×2 IMPLANT
GLOVE BIO SURGEON STRL SZ7 (GLOVE) ×2 IMPLANT
GLOVE BIO SURGEON STRL SZ8 (GLOVE) ×2 IMPLANT
GLOVE BIOGEL PI IND STRL 7.0 (GLOVE) ×1 IMPLANT
GLOVE BIOGEL PI IND STRL 8 (GLOVE) ×1 IMPLANT
GLOVE BIOGEL PI INDICATOR 7.0 (GLOVE) ×1
GLOVE BIOGEL PI INDICATOR 8 (GLOVE) ×1
GOWN STRL REUS W/TWL LRG LVL3 (GOWN DISPOSABLE) ×4 IMPLANT
HANDPIECE INTERPULSE COAX TIP (DISPOSABLE) ×2
HOLDER FOLEY CATH W/STRAP (MISCELLANEOUS) ×2 IMPLANT
IMMOBILIZER KNEE 20 (SOFTGOODS) ×2
IMMOBILIZER KNEE 20 THIGH 36 (SOFTGOODS) ×1 IMPLANT
INSERT PFC SIG STB SZ4 17.5MM (Knees) ×2 IMPLANT
KIT TURNOVER KIT A (KITS) IMPLANT
MANIFOLD NEPTUNE II (INSTRUMENTS) ×2 IMPLANT
NS IRRIG 1000ML POUR BTL (IV SOLUTION) ×2 IMPLANT
PACK TOTAL KNEE CUSTOM (KITS) ×2 IMPLANT
PADDING CAST COTTON 6X4 STRL (CAST SUPPLIES) ×2 IMPLANT
PENCIL SMOKE EVACUATOR (MISCELLANEOUS) IMPLANT
PROTECTOR NERVE ULNAR (MISCELLANEOUS) ×2 IMPLANT
SET HNDPC FAN SPRY TIP SCT (DISPOSABLE) ×1 IMPLANT
STRIP CLOSURE SKIN 1/2X4 (GAUZE/BANDAGES/DRESSINGS) ×2 IMPLANT
SUT MNCRL AB 4-0 PS2 18 (SUTURE) ×2 IMPLANT
SUT STRATAFIX 0 PDS 27 VIOLET (SUTURE) ×2
SUT VIC AB 2-0 CT1 27 (SUTURE) ×6
SUT VIC AB 2-0 CT1 TAPERPNT 27 (SUTURE) ×3 IMPLANT
SUTURE STRATFX 0 PDS 27 VIOLET (SUTURE) ×1 IMPLANT
SWAB COLLECTION DEVICE MRSA (MISCELLANEOUS) IMPLANT
SWAB CULTURE ESWAB REG 1ML (MISCELLANEOUS) IMPLANT
SYR 50ML LL SCALE MARK (SYRINGE) ×4 IMPLANT
TOWER CARTRIDGE SMART MIX (DISPOSABLE) ×2 IMPLANT
TRAY FOLEY MTR SLVR 14FR STAT (SET/KITS/TRAYS/PACK) ×2 IMPLANT
TUBE KAMVAC SUCTION (TUBING) IMPLANT
WATER STERILE IRR 1000ML POUR (IV SOLUTION) ×2 IMPLANT
WRAP KNEE MAXI GEL POST OP (GAUZE/BANDAGES/DRESSINGS) ×4 IMPLANT

## 2019-06-10 NOTE — Discharge Instructions (Addendum)
 Michele Aluisio, MD Total Joint Specialist [facility] [address] [place], [place] [address] [phone] POSTOPERATIVE DIRECTIONS  Knee Rehabilitation, Guidelines Following Surgery  Results after knee surgery are often greatly improved when you follow the exercise, range of motion and muscle strengthening exercises prescribed by your doctor. Safety measures are also important to protect the knee from further injury. If any of these exercises cause you to have increased pain or swelling in your knee joint, decrease the amount until you are comfortable again and slowly increase them. If you have problems or questions, call your caregiver or physical therapist for advice.   BLOOD CLOT PREVENTION . Take a 325 mg Aspirin two times a day for three weeks following surgery. Then take an 81 mg Aspirin once a day for three weeks. Then discontinue Aspirin. . You may resume your vitamins/supplements upon discharge from the hospital. . Do not take any NSAIDs (Advil, Aleve, Ibuprofen, Meloxicam, etc.) until you have discontinued the 325 mg Aspirin.  HOME CARE INSTRUCTIONS  . Remove items at home which could result in a fall. This includes throw rugs or furniture in walking pathways.  . ICE to the affected knee as much as tolerated. Icing helps control swelling. If the swelling is well controlled you will be more comfortable and rehab easier. Continue to use ice on the knee for pain and swelling from surgery. You may notice swelling that will progress down to the foot and ankle. This is normal after surgery. Elevate the leg when you are not up walking on it.    . Continue to use the breathing machine which will help keep your temperature down. It is common for your temperature to cycle up and down following surgery, especially at night when you are not up moving around and exerting yourself. The breathing machine keeps your lungs expanded and your temperature down. . Do not  place pillow under the operative knee, focus on keeping the knee straight while resting  DIET You may resume your previous home diet once you are discharged from the hospital.  DRESSING / WOUND CARE / SHOWERING . Keep your bulky bandage on for 2 days. On the third post-operative day you may remove the Ace bandage and gauze. There is a waterproof adhesive bandage on your skin which will stay in place until your first follow-up appointment. Once you remove this you will not need to place another bandage . You may begin showering 3 days following surgery, but do not submerge the incision under water.  ACTIVITY For the first 5 days, the key is rest and control of pain and swelling . Do your home exercises twice a day starting on post-operative day 3. On the days you go to physical therapy, just do the home exercises once that day. . You should rest, ice and elevate the leg for 50 minutes out of every hour. Get up and walk/stretch for 10 minutes per hour. After 5 days you can increase your activity slowly as tolerated. . Walk with your walker as instructed. Use the walker until you are comfortable transitioning to a cane. Walk with the cane in the opposite hand of the operative leg. You may discontinue the cane once you are comfortable and walking steadily. . Avoid periods of inactivity such as sitting longer than an hour when not asleep. This helps prevent blood clots.  . You may discontinue the knee immobilizer once you are able to perform a straight leg raise while lying down. .   You may resume a sexual relationship in one month or when given the OK by your doctor.  . You may return to work once you are cleared by your doctor.  . Do not drive a car for 6 weeks or until released by your surgeon.  . Do not drive while taking narcotics.  TED HOSE STOCKINGS Wear the elastic stockings on both legs for three weeks following surgery during the day. You may remove them at night for sleeping.  WEIGHT  BEARING Weight bearing as tolerated with assist device (walker, cane, etc) as directed, use it as long as suggested by your surgeon or therapist, typically at least 4-6 weeks.  POSTOPERATIVE CONSTIPATION PROTOCOL Constipation - defined medically as fewer than three stools per week and severe constipation as less than one stool per week.  One of the most common issues patients have following surgery is constipation.  Even if you have a regular bowel pattern at home, your normal regimen is likely to be disrupted due to multiple reasons following surgery.  Combination of anesthesia, postoperative narcotics, change in appetite and fluid intake all can affect your bowels.  In order to avoid complications following surgery, here are some recommendations in order to help you during your recovery period.  . Colace (docusate) - Pick up an over-the-counter form of Colace or another stool softener and take twice a day as long as you are requiring postoperative pain medications.  Take with a full glass of water daily.  If you experience loose stools or diarrhea, hold the colace until you stool forms back up. If your symptoms do not get better within 1 week or if they get worse, check with your doctor. . Dulcolax (bisacodyl) - Pick up over-the-counter and take as directed by the product packaging as needed to assist with the movement of your bowels.  Take with a full glass of water.  Use this product as needed if not relieved by Colace only.  . MiraLax (polyethylene glycol) - Pick up over-the-counter to have on hand. MiraLax is a solution that will increase the amount of water in your bowels to assist with bowel movements.  Take as directed and can mix with a glass of water, juice, soda, coffee, or tea. Take if you go more than two days without a movement. Do not use MiraLax more than once per day. Call your doctor if you are still constipated or irregular after using this medication for 7 days in a row.  If you  continue to have problems with postoperative constipation, please contact the office for further assistance and recommendations.  If you experience "the worst abdominal pain ever" or develop nausea or vomiting, please contact the office immediatly for further recommendations for treatment.  ITCHING If you experience itching with your medications, try taking only a single pain pill, or even half a pain pill at a time.  You can also use Benadryl over the counter for itching or also to help with sleep.   MEDICATIONS See your medication summary on the "After Visit Summary" that the nursing staff will review with you prior to discharge.  You may have some home medications which will be placed on hold until you complete the course of blood thinner medication.  It is important for you to complete the blood thinner medication as prescribed by your surgeon.  Continue your approved medications as instructed at time of discharge.  PRECAUTIONS . If you experience chest pain or shortness of breath - call 911 immediately for   transfer to the hospital emergency department.  . If you develop a fever greater that 101 F, purulent drainage from wound, increased redness or drainage from wound, foul odor from the wound/dressing, or calf pain - CONTACT YOUR SURGEON.                                                   FOLLOW-UP APPOINTMENTS Make sure you keep all of your appointments after your operation with your surgeon and caregivers. You should call the office at the above phone number and make an appointment for approximately two weeks after the date of your surgery or on the date instructed by your surgeon outlined in the "After Visit Summary".  RANGE OF MOTION AND STRENGTHENING EXERCISES  Rehabilitation of the knee is important following a knee injury or an operation. After just a few days of immobilization, the muscles of the thigh which control the knee become weakened and shrink (atrophy). Knee exercises are  designed to build up the tone and strength of the thigh muscles and to improve knee motion. Often times heat used for twenty to thirty minutes before working out will loosen up your tissues and help with improving the range of motion but do not use heat for the first two weeks following surgery. These exercises can be done on a training (exercise) mat, on the floor, on a table or on a bed. Use what ever works the best and is most comfortable for you Knee exercises include:  . Leg Lifts - While your knee is still immobilized in a splint or cast, you can do straight leg raises. Lift the leg to 60 degrees, hold for 3 sec, and slowly lower the leg. Repeat 10-20 times 2-3 times daily. Perform this exercise against resistance later as your knee gets better.  . Quad and Hamstring Sets - Tighten up the muscle on the front of the thigh (Quad) and hold for 5-10 sec. Repeat this 10-20 times hourly. Hamstring sets are done by pushing the foot backward against an object and holding for 5-10 sec. Repeat as with quad sets.   Leg Slides: Lying on your back, slowly slide your foot toward your buttocks, bending your knee up off the floor (only go as far as is comfortable). Then slowly slide your foot back down until your leg is flat on the floor again.  Angel Wings: Lying on your back spread your legs to the side as far apart as you can without causing discomfort.  A rehabilitation program following serious knee injuries can speed recovery and prevent re-injury in the future due to weakened muscles. Contact your doctor or a physical therapist for more information on knee rehabilitation.   IF YOU ARE TRANSFERRED TO A SKILLED REHAB FACILITY If the patient is transferred to a skilled rehab facility following release from the hospital, a list of the current medications will be sent to the facility for the patient to continue.  When discharged from the skilled rehab facility, please have the facility set up the patient's Home  Health Physical Therapy prior to being released. Also, the skilled facility will be responsible for providing the patient with their medications at time of release from the facility to include their pain medication, the muscle relaxants, and their blood thinner medication. If the patient is still at the rehab facility at time of the   two week follow up appointment, the skilled rehab facility will also need to assist the patient in arranging follow up appointment in our office and any transportation needs.  MAKE SURE YOU:  . Understand these instructions.  . Get help right away if you are not doing well or get worse.   DENTAL ANTIBIOTICS:  In most cases prophylactic antibiotics for Dental procdeures after total joint surgery are not necessary.  Exceptions are as follows:  1. History of prior total joint infection  2. Severely immunocompromised (Organ Transplant, cancer chemotherapy, Rheumatoid biologic meds such as Humera)  3. Poorly controlled diabetes (A1C &gt; 8.0, blood glucose over 200)  If you have one of these conditions, contact your surgeon for an antibiotic prescription, prior to your dental procedure.    Pick up stool softner and laxative for home use following surgery while on pain medications. Do not submerge incision under water. Please use good hand washing techniques while changing dressing each day. May shower starting three days after surgery. Please use a clean towel to pat the incision dry following showers. Continue to use ice for pain and swelling after surgery. Do not use any lotions or creams on the incision until instructed by your surgeon.  

## 2019-06-10 NOTE — Anesthesia Postprocedure Evaluation (Signed)
Anesthesia Post Note  Patient: Michele Dawson  Procedure(s) Performed: Left knee polyethylene exchange (Left Knee)     Patient location during evaluation: PACU Anesthesia Type: General Level of consciousness: awake and alert and oriented Pain management: pain level controlled Vital Signs Assessment: post-procedure vital signs reviewed and stable Respiratory status: spontaneous breathing, nonlabored ventilation and respiratory function stable Cardiovascular status: blood pressure returned to baseline Postop Assessment: no apparent nausea or vomiting Anesthetic complications: no    Last Vitals:  Vitals:   06/10/19 1230 06/10/19 1250  BP: 114/71 113/72  Pulse: (!) 50 61  Resp: 11 20  Temp:  36.6 C  SpO2: 100%     Last Pain:  Vitals:   06/10/19 1250  TempSrc: Oral  PainSc:                  Brennan Bailey

## 2019-06-10 NOTE — Brief Op Note (Signed)
06/10/2019  10:31 AM  PATIENT:  Michele Dawson  79 y.o. female  PRE-OPERATIVE DIAGNOSIS:  unstable left total knee arthroplasty  POST-OPERATIVE DIAGNOSIS:  unstable left total knee arthroplasty  PROCEDURE:  Procedure(s) with comments: Left knee polyethylene exchange (Left) - 17min  SURGEON:  Surgeon(s) and Role:    Gaynelle Arabian, MD - Primary  PHYSICIAN ASSISTANT:   ASSISTANTS: Theresa Duty, PA-C   ANESTHESIA:   general and adductor canal block  EBL: 25 ml  BLOOD ADMINISTERED:none  DRAINS: (left knee) Hemovact drain(s) in the left knee with  Suction Open   LOCAL MEDICATIONS USED:  OTHER Exparel  SPECIMEN:  No Specimen  COUNTS:  YES  TOURNIQUET:   Total Tourniquet Time Documented: Thigh (Left) - 18 minutes Total: Thigh (Left) - 18 minutes   DICTATION: .Note written in EPIC and Other Dictation: Dictation Number [id number]  PLAN OF CARE: Admit for overnight observation  PATIENT DISPOSITION:  PACU - hemodynamically stable.

## 2019-06-10 NOTE — Plan of Care (Signed)
  Problem: Education: Goal: Knowledge of General Education information will improve Description: Including pain rating scale, medication(s)/side effects and non-pharmacologic comfort measures Outcome: Progressing   Problem: Health Behavior/Discharge Planning: Goal: Ability to manage health-related needs will improve Outcome: Progressing   Problem: Nutrition: Goal: Adequate nutrition will be maintained Outcome: Progressing   

## 2019-06-10 NOTE — Anesthesia Procedure Notes (Signed)
Anesthesia Regional Block: Adductor canal block   Pre-Anesthetic Checklist: ,, timeout performed, Correct Patient, Correct Site, Correct Laterality, Correct Procedure, Correct Position, site marked, Risks and benefits discussed, pre-op evaluation,  At surgeon's request and post-op pain management  Laterality: Left  Prep: Maximum Sterile Barrier Precautions used, chloraprep       Needles:  Injection technique: Single-shot  Needle Type: Echogenic Stimulator Needle     Needle Length: 9cm  Needle Gauge: 22     Additional Needles:   Procedures:,,,, ultrasound used (permanent image in chart),,,,  Narrative:  Start time: 06/10/2019 9:27 AM End time: 06/10/2019 9:30 AM Injection made incrementally with aspirations every 5 mL.  Performed by: Personally  Anesthesiologist: Brennan Bailey, MD  Additional Notes: Risks, benefits, and alternative discussed. Patient gave consent for procedure. Patient prepped and draped in sterile fashion. Sedation administered, patient remains easily responsive to voice. Relevant anatomy identified with ultrasound guidance. Local anesthetic given in 5cc increments with no signs or symptoms of intravascular injection. No pain or paraesthesias with injection. Patient monitored throughout procedure with signs of LAST or immediate complications. Tolerated well. Ultrasound image placed in chart.  Tawny Asal, MD

## 2019-06-10 NOTE — Plan of Care (Signed)
Plan of care 

## 2019-06-10 NOTE — Progress Notes (Signed)
Orthopedic Tech Progress Note Patient Details:  Michele Dawson 17-Jun-1940 [id number] Patient taken off of CPM Patient ID: Michele Dawson, female   DOB: November 08, 1940, 79 y.o.   MRN: [id number]   Staci Righter 06/10/2019, 4:07 PM

## 2019-06-10 NOTE — Progress Notes (Signed)
Assisted Dr. Howse with left, ultrasound guided, adductor canal block. Side rails up, monitors on throughout procedure. See vital signs in flow sheet. Tolerated Procedure well.  

## 2019-06-10 NOTE — Anesthesia Procedure Notes (Signed)
Spinal  Patient location during procedure: OR Start time: 06/10/2019 9:40 AM End time: 06/10/2019 9:42 AM Staffing Performed: anesthesiologist  Anesthesiologist: Brennan Bailey, MD Preanesthetic Checklist Completed: patient identified, IV checked, risks and benefits discussed, surgical consent, monitors and equipment checked, pre-op evaluation and timeout performed Spinal Block Patient position: sitting Prep: DuraPrep and site prepped and draped Patient monitoring: continuous pulse ox, blood pressure and heart rate Approach: midline Location: L3-4 Injection technique: single-shot Needle Needle type: Pencan  Needle gauge: 24 G Needle length: 9 cm Additional Notes Risks, benefits, and alternative discussed. Patient gave consent to procedure. Prepped and draped in sitting position. Patient sedated but responsive to voice. Clear CSF obtained after one needle redirection. Positive terminal aspiration. No pain or paraesthesias with injection. Patient tolerated procedure well. Vital signs stable. Tawny Asal, MD

## 2019-06-10 NOTE — Op Note (Signed)
NAME: Michele Dawson, TERZIC MEDICAL RECORD [id number] ACCOUNT [id number] DATE OF BIRTH:Sep 29, 1940 FACILITY: [facility] LOCATION: [facility] PHYSICIAN:Evangelia Whitaker Zella Ball, MD  OPERATIVE REPORT  DATE OF PROCEDURE:  06/10/2019  PREOPERATIVE DIAGNOSIS:  Unstable left total knee arthroplasty.  POSTOPERATIVE DIAGNOSIS:  Unstable left total knee arthroplasty.  PROCEDURE:  Left knee polyethylene revision.  SURGEON:  Gaynelle Arabian, MD  ASSISTANT:  Theresa Duty, PA-C  ANESTHESIA:  Adductor canal block and general.  ESTIMATED BLOOD LOSS:  Minimal.  DRAIN:  Hemovac x1.  TOURNIQUET TIME:  18 minutes at 300 mmHg.  COMPLICATIONS:  None.  CONDITION:  Stable to recovery.  BRIEF CLINICAL NOTE:  The patient is a 79 year old female had a left total knee arthroplasty done approximately 10 years ago.  She was doing well for a long time and then started to develop some instability symptoms.  There was no evidence of any wear or  osteolysis on her x-rays.  She said the knee would give out on her when walking.  On exam, she did have some varus valgus laxity in extension and AP laxity in flexion.  Bracing did not provide any long-term benefit.  She presents now for polyethylene  versus total knee arthroplasty revision.  PROCEDURE IN DETAIL:  After successful administration of adductor canal block and general anesthetic, a tourniquet was placed on the left thigh and left lower extremity prepped and draped in the usual sterile fashion.  Extremity was wrapped in Esmarch,  knee flexed and tourniquet inflated to 300 mmHg.  Midline incision was made with a 10 blade through subcutaneous tissue to the level of the extensor mechanism.  A fresh blade was used to make a medial parapatellar arthrotomy.  Soft tissue over the  proximal medial tibia subperiosteally elevated to the joint line with a knife and into the semimembranosus bursa with a Cobb elevator.  Soft tissue laterally was elevated with attention being paid to  avoid the patellar tendon on the tibial tubercle.   Scar tissue was then excised from under the extensor mechanism, both medial and lateral.  I was able to evert the patella and flex the knee 90 degrees.  The polyethylene thickness was 10 mm that was currently in place.  I was able to remove that tibial  polyethylene, which was a posterior stabilized rotating platform insert.  The tibial and femoral components were then inspected, especially with attention being paid to avoiding the prosthetic bone interface.  Both components were in excellent alignment  and appeared to be extremely stable.  We thus tried a thicker polyethylene starting at 15 mm, which still had a tiny bit of AP laxity and then went up to 17.5, which allowed for full extension with excellent varus/valgus and anterior/posterior balance  throughout full range of motion.  The trial was removed.  20 mL of Exparel mixed with 60 mL of saline was injected into the posterior capsule, extensor mechanism, periosteum of femur, and subcutaneous tissues.  The permanent 17.5 mm posterior stabilized  rotating platform insert for the size 3 was placed into the tibial tray.  The knee was then reduced and there was outstanding stability throughout full range of motion.  Wound was copiously irrigated with saline solution and the arthrotomy closed over a  Hemovac drain with running 0 Stratafix suture.  Tourniquet was released with a total time 18 minutes.  Minor bleeding was stopped with cautery.  Flexion against gravity was 140 degrees.  Subcutaneous was closed with interrupted 2-0 Vicryl and  subcuticular running 4-0 Monocryl.  The  incision was then cleaned and dried and Steri-Strips and a sterile dressing applied.  She was then awakened and transported to recovery in stable condition.  Note that a surgical assistant was a medical necessity for this procedure to do it in a safe and expeditious manner.  Surgical assistance necessary for retraction of vital  ligaments and neurovascular structures and for proper positioning of the limb for  removal of the old prosthesis and safe and accurate placement of the new prosthesis.  CN/NUANCE  D:06/10/2019 T:06/10/2019 JOB:[id number]

## 2019-06-10 NOTE — Transfer of Care (Signed)
Immediate Anesthesia Transfer of Care Note  Patient: Michele Dawson  Procedure(s) Performed: Left knee polyethylene exchange (Left Knee)  Patient Location: PACU  Anesthesia Type:General  Level of Consciousness: sedated  Airway & Oxygen Therapy: Patient Spontanous Breathing and Patient connected to face mask oxygen  Post-op Assessment: Report given to RN and Post -op Vital signs reviewed and stable  Post vital signs: Reviewed and stable  Last Vitals:  Vitals Value Taken Time  BP 101/60 06/10/19 1053  Temp    Pulse 72 06/10/19 1054  Resp 11 06/10/19 1054  SpO2 99 % 06/10/19 1054  Vitals shown include unvalidated device data.  Last Pain:  Vitals:   06/10/19 0930  TempSrc:   PainSc: 0-No pain         Complications: No apparent anesthesia complications

## 2019-06-10 NOTE — Evaluation (Signed)
Physical Therapy Evaluation Patient Details Name: Michele Dawson MRN: [id number] DOB: November 16, 1940 Today's Date: 06/10/2019   History of Present Illness  s/p L knee poly exchange d/t instability TKA. PMH: DM, HTN  Clinical Impression  Pt is s/p TKA resulting in the deficits listed below (see PT Problem List).  Pt amb 50' with RW and min assist. Anticipate steady progress, pt is hopeful to d/c tomorrow   Pt will benefit from skilled PT to increase their independence and safety with mobility to allow discharge to the venue listed below.      Follow Up Recommendations Follow surgeon's recommendation for DC plan and follow-up therapies    Equipment Recommendations  None recommended by PT    Recommendations for Other Services       Precautions / Restrictions Precautions Precautions: Fall;Knee Required Braces or Orthoses: Knee Immobilizer - Left Knee Immobilizer - Left: Discontinue once straight leg raise with < 10 degree lag Restrictions Weight Bearing Restrictions: No Other Position/Activity Restrictions: WBAT      Mobility  Bed Mobility Overal bed mobility: Needs Assistance Bed Mobility: Supine to Sit     Supine to sit: Min guard     General bed mobility comments: for safety  Transfers Overall transfer level: Needs assistance Equipment used: Rolling walker (2 wheeled) Transfers: Sit to/from Stand Sit to Stand: Min guard;Min assist         General transfer comment: cues for hand placement and LLE position  Ambulation/Gait Ambulation/Gait assistance: Min assist Gait Distance (Feet): 50 Feet Assistive device: Rolling walker (2 wheeled) Gait Pattern/deviations: Step-to pattern;Decreased stance time - left;Trunk flexed     General Gait Details: cues for sequence, posture, RW position  Stairs            Wheelchair Mobility    Modified Rankin (Stroke Patients Only)       Balance                                              Pertinent Vitals/Pain Pain Assessment: 0-10 Pain Score: 4  Pain Location: left  knee Pain Descriptors / Indicators: Sore Pain Intervention(s): Premedicated before session;Monitored during session;Limited activity within patient's tolerance;Repositioned;Ice applied    Home Living Family/patient expects to be discharged to:: Private residence Living Arrangements: Alone   Type of Home: House Home Access: Stairs to enter   [employer] of Steps: 1 Home Layout: One level Home Equipment: Environmental consultant - 2 wheels;Cane - single point      Prior Function Level of Independence: Independent               Hand Dominance        Extremity/Trunk Assessment   Upper Extremity Assessment Upper Extremity Assessment: Overall WFL for tasks assessed    Lower Extremity Assessment Lower Extremity Assessment: LLE deficits/detail LLE Deficits / Details: ankle WFL; knee AAROM ~ 3 to 65 degrees flexion; strength grossly 3/5 knee and hip       Communication   Communication: No difficulties  Cognition Arousal/Alertness: Awake/alert Behavior During Therapy: WFL for tasks assessed/performed Overall Cognitive Status: Within Functional Limits for tasks assessed                                        General Comments  Exercises Total Joint Exercises Ankle Circles/Pumps: AROM;10 reps;Both Quad Sets: 5 reps;Both;AROM Straight Leg Raises: AROM;5 reps;Left   Assessment/Plan    PT Assessment Patient needs continued PT services  PT Problem List Decreased strength;Decreased range of motion;Decreased activity tolerance;Pain;Decreased knowledge of use of DME;Decreased mobility       PT Treatment Interventions DME instruction;Therapeutic exercise;Functional mobility training;Therapeutic activities;Patient/family education;Gait training;Stair training    PT Goals (Current goals can be found in the Care Plan section)  Acute Rehab PT Goals Patient Stated Goal:  less knee pain with activity PT Goal Formulation: With patient Time For Goal Achievement: 06/17/19 Potential to Achieve Goals: Good    Frequency 7X/week   Barriers to discharge        Co-evaluation               AM-PAC PT "6 Clicks" Mobility  Outcome Measure Help needed turning from your back to your side while in a flat bed without using bedrails?: A Little Help needed moving from lying on your back to sitting on the side of a flat bed without using bedrails?: A Little Help needed moving to and from a bed to a chair (including a wheelchair)?: A Little Help needed standing up from a chair using your arms (e.g., wheelchair or bedside chair)?: A Little Help needed to walk in hospital room?: A Little Help needed climbing 3-5 steps with a railing? : A Little 6 Click Score: 18    End of Session Equipment Utilized During Treatment: Gait belt Activity Tolerance: Patient tolerated treatment well Patient left: in chair;with call bell/phone within reach;with chair alarm set   PT Visit Diagnosis: Difficulty in walking, not elsewhere classified (R26.2)    Time: [id number] PT Time Calculation (min) (ACUTE ONLY): 15 min   Charges:   PT Evaluation $PT Eval Low Complexity: 1 Low          Janijah Symons, PT   Acute Rehab Dept [facility]): [id number]   06/10/2019   [facility] 06/10/2019, 4:23 PM

## 2019-06-10 NOTE — Anesthesia Procedure Notes (Signed)
Procedure Name: LMA Insertion Date/Time: 06/10/2019 10:04 AM Performed by: Maxwell Caul, CRNA Pre-anesthesia Checklist: Patient identified, Suction available, Emergency Drugs available and Patient being monitored Patient Re-evaluated:Patient Re-evaluated prior to induction Oxygen Delivery Method: Circle system utilized Preoxygenation: Pre-oxygenation with 100% oxygen Induction Type: IV induction LMA: LMA inserted LMA Size: 4.0 Number of attempts: 1 Placement Confirmation: positive ETCO2 and breath sounds checked- equal and bilateral Tube secured with: Tape Dental Injury: Teeth and Oropharynx as per pre-operative assessment

## 2019-06-10 NOTE — Interval H&P Note (Signed)
History and Physical Interval Note:  06/10/2019 7:48 AM  Michele Dawson  has presented today for surgery, with the diagnosis of unstable left total knee arthroplasty.  The various methods of treatment have been discussed with the patient and family. After consideration of risks, benefits and other options for treatment, the patient has consented to  Procedure(s) with comments: Left knee polyethylene versus total knee arthroplasty revision (Left) - 45min as a surgical intervention.  The patient's history has been reviewed, patient examined, no change in status, stable for surgery.  I have reviewed the patient's chart and labs.  Questions were answered to the patient's satisfaction.     Pilar Plate Mcdaniel Ohms

## 2019-06-11 ENCOUNTER — Encounter: Payer: Self-pay | Admitting: *Deleted

## 2019-06-11 DIAGNOSIS — K219 Gastro-esophageal reflux disease without esophagitis: Secondary | ICD-10-CM | POA: Diagnosis not present

## 2019-06-11 DIAGNOSIS — E785 Hyperlipidemia, unspecified: Secondary | ICD-10-CM | POA: Diagnosis not present

## 2019-06-11 DIAGNOSIS — E119 Type 2 diabetes mellitus without complications: Secondary | ICD-10-CM | POA: Diagnosis not present

## 2019-06-11 DIAGNOSIS — E039 Hypothyroidism, unspecified: Secondary | ICD-10-CM | POA: Diagnosis not present

## 2019-06-11 DIAGNOSIS — I10 Essential (primary) hypertension: Secondary | ICD-10-CM | POA: Diagnosis not present

## 2019-06-11 DIAGNOSIS — T84023A Instability of internal left knee prosthesis, initial encounter: Secondary | ICD-10-CM | POA: Diagnosis not present

## 2019-06-11 LAB — CBC
HCT: 35.9 % — ABNORMAL LOW (ref 36.0–46.0)
Hemoglobin: 11.7 g/dL — ABNORMAL LOW (ref 12.0–15.0)
MCH: 32.3 pg (ref 26.0–34.0)
MCHC: 32.6 g/dL (ref 30.0–36.0)
MCV: 99.2 fL (ref 80.0–100.0)
Platelets: 189 10*3/uL (ref 150–400)
RBC: 3.62 MIL/uL — ABNORMAL LOW (ref 3.87–5.11)
RDW: 12.6 % (ref 11.5–15.5)
WBC: 10.3 10*3/uL (ref 4.0–10.5)
nRBC: 0 % (ref 0.0–0.2)

## 2019-06-11 LAB — BASIC METABOLIC PANEL
Anion gap: 9 (ref 5–15)
BUN: 21 mg/dL (ref 8–23)
CO2: 26 mmol/L (ref 22–32)
Calcium: 9.5 mg/dL (ref 8.9–10.3)
Chloride: 101 mmol/L (ref 98–111)
Creatinine, Ser: 0.77 mg/dL (ref 0.44–1.00)
GFR calc Af Amer: >60 mL/min (ref >60)
GFR calc non Af Amer: >60 mL/min (ref >60)
Glucose, Bld: 169 mg/dL — ABNORMAL HIGH (ref 70–99)
Potassium: 4.4 mmol/L (ref 3.5–5.1)
Sodium: 136 mmol/L (ref 135–145)

## 2019-06-11 MED ORDER — OXYCODONE HCL 5 MG PO TABS: 5.0000 mg | ORAL_TABLET | Freq: Four times a day (QID) | ORAL | 0 refills | Status: DC | PRN

## 2019-06-11 MED ORDER — ASPIRIN 325 MG PO TBEC: 325.0000 mg | DELAYED_RELEASE_TABLET | Freq: Two times a day (BID) | ORAL | 0 refills | Status: AC

## 2019-06-11 MED ORDER — TRAMADOL HCL 50 MG PO TABS: 50.0000 mg | ORAL_TABLET | Freq: Four times a day (QID) | ORAL | 0 refills | Status: DC | PRN

## 2019-06-11 MED ORDER — METHOCARBAMOL 500 MG PO TABS: 500.0000 mg | ORAL_TABLET | Freq: Four times a day (QID) | ORAL | 0 refills | Status: DC | PRN

## 2019-06-11 NOTE — Plan of Care (Signed)
  Problem: Education: Goal: Knowledge of General Education information will improve Description: Including pain rating scale, medication(s)/side effects and non-pharmacologic comfort measures Outcome: Progressing   Problem: Health Behavior/Discharge Planning: Goal: Ability to manage health-related needs will improve Outcome: Progressing   Problem: Education: Goal: Knowledge of General Education information will improve Description: Including pain rating scale, medication(s)/side effects and non-pharmacologic comfort measures Outcome: Progressing   Problem: Clinical Measurements: Goal: Ability to maintain clinical measurements within normal limits will improve Outcome: Progressing Goal: Will remain free from infection Outcome: Progressing Goal: Diagnostic test results will improve Outcome: Progressing Goal: Respiratory complications will improve Outcome: Progressing Goal: Cardiovascular complication will be avoided Outcome: Progressing   Problem: Activity: Goal: Risk for activity intolerance will decrease Outcome: Progressing   Problem: Nutrition: Goal: Adequate nutrition will be maintained Outcome: Progressing   Problem: Coping: Goal: Level of anxiety will decrease Outcome: Progressing   Problem: Elimination: Goal: Will not experience complications related to bowel motility Outcome: Progressing Goal: Will not experience complications related to urinary retention Outcome: Progressing   Problem: Pain Managment: Goal: General experience of comfort will improve Outcome: Progressing   Problem: Safety: Goal: Ability to remain free from injury will improve Outcome: Progressing   Problem: Education: Goal: Knowledge of the prescribed therapeutic regimen will improve Outcome: Progressing Goal: Individualized Educational Video(s) Outcome: Progressing   Problem: Activity: Goal: Ability to avoid complications of mobility impairment will improve Outcome:  Progressing Goal: Range of joint motion will improve Outcome: Progressing   Problem: Clinical Measurements: Goal: Postoperative complications will be avoided or minimized Outcome: Progressing   Problem: Pain Management: Goal: Pain level will decrease with appropriate interventions Outcome: Progressing   Problem: Skin Integrity: Goal: Will show signs of wound healing Outcome: Progressing

## 2019-06-11 NOTE — TOC Progression Note (Signed)
Transition of Care [facility]) - Progression Note    Patient Details  Name: BRYANNA WASSENBERG MRN: [id number] Date of Birth: [birth year]  Transition of Care [facility]) CM/SW Mc[place], LCSW Phone Number: 06/11/2019, 10:21 AM  Clinical Narrative:    Therapy Plan: OPPT Patient has DME, RW, high toilet seats and 2 canes     Barriers to Discharge: No Barriers Identified  Expected Discharge Plan and Services           Expected Discharge Date: 06/11/19               DME Arranged: N/A DME Agency: NA         HH Agency: NA         Social Determinants of Health (SDOH) Interventions    Readmission Risk Interventions No flowsheet data found.

## 2019-06-11 NOTE — Progress Notes (Signed)
   Subjective: 1 Day Post-Op Procedure(s) (LRB): Left knee polyethylene exchange (Left) Patient reports pain as mild.   Patient seen in rounds with Dr. Wynelle Link. Patient is well, and has had no acute complaints or problems other than discomfort in the left knee. No acute events overnight. Voiding without difficulty, positive flatus. Patient states she is ready to go home today.  We will continue therapy today.   Objective: Vital signs in last 24 hours: Temp:  [97.6 F (36.4 C)-99 F (37.2 C)] 97.8 F (36.6 C) (04/15 0606) Pulse Rate:  [50-90] 56 (04/15 0606) Resp:  [7-20] 18 (04/15 0606) BP: (97-131)/(59-77) 112/71 (04/15 0606) SpO2:  [92 %-100 %] 99 % (04/15 0606) Weight:  [77.6 kg] 77.6 kg (04/14 1250)  Intake/Output from previous day:  Intake/Output Summary (Last 24 hours) at 06/11/2019 0725 Last data filed at 06/11/2019 0647 Gross per 24 hour  Intake 2712.73 ml  Output 12607.5 ml  Net -9894.77 ml     Intake/Output this shift: No intake/output data recorded.  Labs: Recent Labs    06/11/19 0339  HGB 11.7*   Recent Labs    06/11/19 0339  WBC 10.3  RBC 3.62*  HCT 35.9*  PLT 189   Recent Labs    06/11/19 0339  NA 136  K 4.4  CL 101  CO2 26  BUN 21  CREATININE 0.77  GLUCOSE 169*  CALCIUM 9.5   No results for input(s): LABPT, INR in the last 72 hours.  Exam: General - Patient is Alert and Oriented Extremity - Neurologically intact Sensation intact distally Intact pulses distally Dorsiflexion/Plantar flexion intact Dressing - dressing C/D/I Motor Function - intact, moving foot and toes well on exam.   Past Medical History:  Diagnosis Date  . Arthritis   . Diabetes mellitus    Type II  . Diverticulosis   . Dyspnea    climbing stairs  . Family history of malignant neoplasm of gastrointestinal tract   . GERD (gastroesophageal reflux disease)    Maalox prn  . Hyperlipidemia   . Hypertension   . Rectal bleeding 11/12/2018    Assessment/Plan:  1 Day Post-Op Procedure(s) (LRB): Left knee polyethylene exchange (Left) Active Problems:   History of total knee arthroplasty, left  Estimated body mass index is 27.61 kg/m as calculated from the following:   Height as of this encounter: 5\' 6"  (1.676 m).   Weight as of this encounter: 77.6 kg. Advance diet Up with therapy D/C IV fluids  DVT Prophylaxis - Aspirin Weight bearing as tolerated. D/C O2 and pulse ox and try on room air. Hemovac pulled without difficulty, will begin therapy today.  Plan is to go Home after hospital stay with OPPT. Plan for discharge today following 1 session of therapy as long as she is meeting her goals. She may remove the bulky dressing tomorrow, but leave the aquacel dressing in place until follow up. Follow up in the office in 2 weeks.    Griffith Citron, PA-C Orthopedic Surgery [phone] 06/11/2019, 7:25 AM

## 2019-06-11 NOTE — Progress Notes (Signed)
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 PMH: DM, HTN    PT Comments    Pt eager and ready for discharging.  Pt ambulated in hallway and practiced one step (threshold to enter).  Pt also performed LE exercises and provided with HEP handout.  Pt states daughter will assist her home. Pt tends to leave RW behind so reminded pt to use for a few days for safety.  Pt plans to f/u with OP PT.    Follow Up Recommendations  Follow surgeon's recommendation for DC plan and follow-up therapies     Equipment Recommendations  None recommended by PT    Recommendations for Other Services       Precautions / Restrictions Precautions Precautions: Fall;Knee Restrictions Weight Bearing Restrictions: No    Mobility  Bed Mobility Overal bed mobility: Needs Assistance Bed Mobility: Supine to Sit     Supine to sit: Supervision     General bed mobility comments: for safety  Transfers Overall transfer level: Needs assistance Equipment used: Rolling walker (2 wheeled) Transfers: Sit to/from Stand Sit to Stand: Min guard         General transfer comment: cues for hand placement and LLE position; performed x3 for instruction/technique  Ambulation/Gait Ambulation/Gait assistance: Min guard Gait Distance (Feet): 160 Feet Assistive device: Rolling walker (2 wheeled) Gait Pattern/deviations: Step-to pattern;Decreased stance time - left;Trunk flexed     General Gait Details: cues for sequence, posture, RW position   Stairs Stairs: Yes Stairs assistance: Min guard Stair Management: Step to pattern;Forwards;With walker Number of Stairs: 1 General stair comments: verbal cues for technique, performed twice   Wheelchair Mobility    Modified Rankin (Stroke Patients Only)       Balance                                             Cognition Arousal/Alertness: Awake/alert Behavior During Therapy: WFL for tasks assessed/performed Overall Cognitive Status: Within Functional Limits for tasks assessed                                 General Comments: slightly impulsive, required multiple safety cues for waiting for nursing to assist (attempting to move around room in preparation for d/c)      Exercises Total Joint Exercises Ankle Circles/Pumps: AROM;Both;10 reps Quad Sets: AROM;Both;10 reps Short Arc Quad: AROM;Left;10 reps Heel Slides: AAROM;Left;10 reps Hip ABduction/ADduction: AROM;Left;10 reps Straight Leg Raises: Left;10 reps;AROM Knee Flexion: AAROM;Seated;Left;10 reps    General Comments        Pertinent Vitals/Pain Pain Assessment: 0-10 Pain Score: 3  Pain Location: left  knee Pain Descriptors / Indicators: Sore;Aching Pain Intervention(s): Repositioned;Monitored during session    Home Living                      Prior Function            PT Goals (current goals can now be found in the care plan section) Progress towards PT goals: Progressing toward goals    Frequency    7X/week      PT Plan Current plan remains appropriate    Co-evaluation  AM-PAC PT "6 Clicks" Mobility   Outcome Measure  Help needed turning from your back to your side while in a flat bed without using bedrails?: A Little Help needed moving from lying on your back to sitting on the side of a flat bed without using bedrails?: A Little Help needed moving to and from a bed to a chair (including a wheelchair)?: A Little Help needed standing up from a chair using your arms (e.g., wheelchair or bedside chair)?: A Little Help needed to walk in hospital room?: A Little Help needed climbing 3-5 steps with a railing? : A Little 6 Click Score: 18    End of Session Equipment Utilized During Treatment: Gait belt Activity Tolerance: Patient tolerated  treatment well Patient left: in chair;with call bell/phone within reach;with chair alarm set Nurse Communication: Mobility status PT Visit Diagnosis: Difficulty in walking, not elsewhere classified (R26.2)     Time: [id number] PT Time Calculation (min) (ACUTE ONLY): 33 min  Charges:  $Gait Training: 8-22 mins $Therapeutic Exercise: 8-22 mins                     [name], DPT Acute Rehabilitation Services Office: [phone]  [name] 06/11/2019, 3:51 PM

## 2019-06-16 DIAGNOSIS — M25562 Pain in left knee: Secondary | ICD-10-CM | POA: Diagnosis not present

## 2019-06-16 DIAGNOSIS — M25669 Stiffness of unspecified knee, not elsewhere classified: Secondary | ICD-10-CM | POA: Diagnosis not present

## 2019-06-19 DIAGNOSIS — M25669 Stiffness of unspecified knee, not elsewhere classified: Secondary | ICD-10-CM | POA: Diagnosis not present

## 2019-06-19 DIAGNOSIS — M25562 Pain in left knee: Secondary | ICD-10-CM | POA: Diagnosis not present

## 2019-06-23 DIAGNOSIS — M25669 Stiffness of unspecified knee, not elsewhere classified: Secondary | ICD-10-CM | POA: Diagnosis not present

## 2019-06-23 DIAGNOSIS — M25562 Pain in left knee: Secondary | ICD-10-CM | POA: Diagnosis not present

## 2019-06-29 DIAGNOSIS — I10 Essential (primary) hypertension: Secondary | ICD-10-CM | POA: Diagnosis not present

## 2019-06-29 DIAGNOSIS — Z6827 Body mass index (BMI) 27.0-27.9, adult: Secondary | ICD-10-CM | POA: Diagnosis not present

## 2019-06-30 DIAGNOSIS — M25669 Stiffness of unspecified knee, not elsewhere classified: Secondary | ICD-10-CM | POA: Diagnosis not present

## 2019-06-30 DIAGNOSIS — M25562 Pain in left knee: Secondary | ICD-10-CM | POA: Diagnosis not present

## 2019-07-02 DIAGNOSIS — M25562 Pain in left knee: Secondary | ICD-10-CM | POA: Diagnosis not present

## 2019-07-02 DIAGNOSIS — M25669 Stiffness of unspecified knee, not elsewhere classified: Secondary | ICD-10-CM | POA: Diagnosis not present

## 2019-07-07 DIAGNOSIS — M25669 Stiffness of unspecified knee, not elsewhere classified: Secondary | ICD-10-CM | POA: Diagnosis not present

## 2019-07-07 DIAGNOSIS — M25562 Pain in left knee: Secondary | ICD-10-CM | POA: Diagnosis not present

## 2019-07-14 DIAGNOSIS — Z471 Aftercare following joint replacement surgery: Secondary | ICD-10-CM | POA: Diagnosis not present

## 2019-07-14 DIAGNOSIS — M25669 Stiffness of unspecified knee, not elsewhere classified: Secondary | ICD-10-CM | POA: Diagnosis not present

## 2019-07-14 DIAGNOSIS — M25562 Pain in left knee: Secondary | ICD-10-CM | POA: Diagnosis not present

## 2019-07-14 DIAGNOSIS — M25462 Effusion, left knee: Secondary | ICD-10-CM | POA: Diagnosis not present

## 2019-07-14 DIAGNOSIS — Z96652 Presence of left artificial knee joint: Secondary | ICD-10-CM | POA: Diagnosis not present

## 2019-07-20 DIAGNOSIS — Z6827 Body mass index (BMI) 27.0-27.9, adult: Secondary | ICD-10-CM | POA: Diagnosis not present

## 2019-07-20 DIAGNOSIS — I10 Essential (primary) hypertension: Secondary | ICD-10-CM | POA: Diagnosis not present

## 2019-07-20 DIAGNOSIS — E7800 Pure hypercholesterolemia, unspecified: Secondary | ICD-10-CM | POA: Diagnosis not present

## 2019-07-20 DIAGNOSIS — E1140 Type 2 diabetes mellitus with diabetic neuropathy, unspecified: Secondary | ICD-10-CM | POA: Diagnosis not present

## 2019-07-20 DIAGNOSIS — H9319 Tinnitus, unspecified ear: Secondary | ICD-10-CM | POA: Diagnosis not present

## 2019-07-20 DIAGNOSIS — E039 Hypothyroidism, unspecified: Secondary | ICD-10-CM | POA: Diagnosis not present

## 2019-07-20 DIAGNOSIS — R202 Paresthesia of skin: Secondary | ICD-10-CM | POA: Diagnosis not present

## 2019-07-20 DIAGNOSIS — Z7984 Long term (current) use of oral hypoglycemic drugs: Secondary | ICD-10-CM | POA: Diagnosis not present

## 2019-07-20 DIAGNOSIS — D6489 Other specified anemias: Secondary | ICD-10-CM | POA: Diagnosis not present

## 2019-07-28 DIAGNOSIS — M25562 Pain in left knee: Secondary | ICD-10-CM | POA: Diagnosis not present

## 2019-07-28 DIAGNOSIS — Z471 Aftercare following joint replacement surgery: Secondary | ICD-10-CM | POA: Diagnosis not present

## 2019-07-28 DIAGNOSIS — Z96652 Presence of left artificial knee joint: Secondary | ICD-10-CM | POA: Diagnosis not present

## 2019-08-26 DIAGNOSIS — E7800 Pure hypercholesterolemia, unspecified: Secondary | ICD-10-CM | POA: Diagnosis not present

## 2019-08-26 DIAGNOSIS — E039 Hypothyroidism, unspecified: Secondary | ICD-10-CM | POA: Diagnosis not present

## 2019-08-26 DIAGNOSIS — M1712 Unilateral primary osteoarthritis, left knee: Secondary | ICD-10-CM | POA: Diagnosis not present

## 2019-08-26 DIAGNOSIS — M4722 Other spondylosis with radiculopathy, cervical region: Secondary | ICD-10-CM | POA: Diagnosis not present

## 2019-08-26 DIAGNOSIS — D508 Other iron deficiency anemias: Secondary | ICD-10-CM | POA: Diagnosis not present

## 2019-08-26 DIAGNOSIS — E1140 Type 2 diabetes mellitus with diabetic neuropathy, unspecified: Secondary | ICD-10-CM | POA: Diagnosis not present

## 2019-08-26 DIAGNOSIS — I10 Essential (primary) hypertension: Secondary | ICD-10-CM | POA: Diagnosis not present

## 2019-08-26 DIAGNOSIS — M47896 Other spondylosis, lumbar region: Secondary | ICD-10-CM | POA: Diagnosis not present

## 2019-08-27 DIAGNOSIS — M1712 Unilateral primary osteoarthritis, left knee: Secondary | ICD-10-CM | POA: Diagnosis not present

## 2019-08-27 DIAGNOSIS — I10 Essential (primary) hypertension: Secondary | ICD-10-CM | POA: Diagnosis not present

## 2019-08-27 DIAGNOSIS — E039 Hypothyroidism, unspecified: Secondary | ICD-10-CM | POA: Diagnosis not present

## 2019-08-27 DIAGNOSIS — M4722 Other spondylosis with radiculopathy, cervical region: Secondary | ICD-10-CM | POA: Diagnosis not present

## 2019-08-27 DIAGNOSIS — D508 Other iron deficiency anemias: Secondary | ICD-10-CM | POA: Diagnosis not present

## 2019-08-27 DIAGNOSIS — E7800 Pure hypercholesterolemia, unspecified: Secondary | ICD-10-CM | POA: Diagnosis not present

## 2019-08-27 DIAGNOSIS — E1140 Type 2 diabetes mellitus with diabetic neuropathy, unspecified: Secondary | ICD-10-CM | POA: Diagnosis not present

## 2019-08-27 DIAGNOSIS — M47896 Other spondylosis, lumbar region: Secondary | ICD-10-CM | POA: Diagnosis not present

## 2019-09-15 DIAGNOSIS — M542 Cervicalgia: Secondary | ICD-10-CM | POA: Diagnosis not present

## 2019-09-15 DIAGNOSIS — R519 Headache, unspecified: Secondary | ICD-10-CM | POA: Diagnosis not present

## 2019-09-15 DIAGNOSIS — H9202 Otalgia, left ear: Secondary | ICD-10-CM | POA: Diagnosis not present

## 2019-09-18 DIAGNOSIS — R6884 Jaw pain: Secondary | ICD-10-CM | POA: Diagnosis not present

## 2019-09-20 DIAGNOSIS — S299XXA Unspecified injury of thorax, initial encounter: Secondary | ICD-10-CM | POA: Diagnosis not present

## 2019-09-20 DIAGNOSIS — S20219A Contusion of unspecified front wall of thorax, initial encounter: Secondary | ICD-10-CM | POA: Diagnosis not present

## 2019-09-22 DIAGNOSIS — H1045 Other chronic allergic conjunctivitis: Secondary | ICD-10-CM | POA: Diagnosis not present

## 2019-09-22 DIAGNOSIS — H5213 Myopia, bilateral: Secondary | ICD-10-CM | POA: Diagnosis not present

## 2019-09-22 DIAGNOSIS — H524 Presbyopia: Secondary | ICD-10-CM | POA: Diagnosis not present

## 2019-09-22 DIAGNOSIS — H52223 Regular astigmatism, bilateral: Secondary | ICD-10-CM | POA: Diagnosis not present

## 2019-09-25 DIAGNOSIS — H11122 Conjunctival concretions, left eye: Secondary | ICD-10-CM | POA: Diagnosis not present

## 2019-09-25 DIAGNOSIS — T1512XA Foreign body in conjunctival sac, left eye, initial encounter: Secondary | ICD-10-CM | POA: Diagnosis not present

## 2019-09-29 DIAGNOSIS — E7800 Pure hypercholesterolemia, unspecified: Secondary | ICD-10-CM | POA: Diagnosis not present

## 2019-09-29 DIAGNOSIS — D508 Other iron deficiency anemias: Secondary | ICD-10-CM | POA: Diagnosis not present

## 2019-09-29 DIAGNOSIS — M47896 Other spondylosis, lumbar region: Secondary | ICD-10-CM | POA: Diagnosis not present

## 2019-09-29 DIAGNOSIS — E1140 Type 2 diabetes mellitus with diabetic neuropathy, unspecified: Secondary | ICD-10-CM | POA: Diagnosis not present

## 2019-09-29 DIAGNOSIS — I10 Essential (primary) hypertension: Secondary | ICD-10-CM | POA: Diagnosis not present

## 2019-09-29 DIAGNOSIS — E039 Hypothyroidism, unspecified: Secondary | ICD-10-CM | POA: Diagnosis not present

## 2019-09-29 DIAGNOSIS — M1712 Unilateral primary osteoarthritis, left knee: Secondary | ICD-10-CM | POA: Diagnosis not present

## 2019-09-29 DIAGNOSIS — M4722 Other spondylosis with radiculopathy, cervical region: Secondary | ICD-10-CM | POA: Diagnosis not present

## 2019-10-06 DIAGNOSIS — D509 Iron deficiency anemia, unspecified: Secondary | ICD-10-CM | POA: Diagnosis not present

## 2019-10-06 DIAGNOSIS — K5730 Diverticulosis of large intestine without perforation or abscess without bleeding: Secondary | ICD-10-CM | POA: Diagnosis not present

## 2019-10-06 DIAGNOSIS — R1032 Left lower quadrant pain: Secondary | ICD-10-CM | POA: Diagnosis not present

## 2019-10-06 DIAGNOSIS — Z800 Family history of malignant neoplasm of digestive organs: Secondary | ICD-10-CM | POA: Diagnosis not present

## 2019-10-06 DIAGNOSIS — R634 Abnormal weight loss: Secondary | ICD-10-CM | POA: Diagnosis not present

## 2019-10-06 DIAGNOSIS — R194 Change in bowel habit: Secondary | ICD-10-CM | POA: Diagnosis not present

## 2019-10-13 DIAGNOSIS — Z86010 Personal history of colonic polyps: Secondary | ICD-10-CM | POA: Diagnosis not present

## 2019-10-13 DIAGNOSIS — R194 Change in bowel habit: Secondary | ICD-10-CM | POA: Diagnosis not present

## 2019-10-13 DIAGNOSIS — K5730 Diverticulosis of large intestine without perforation or abscess without bleeding: Secondary | ICD-10-CM | POA: Diagnosis not present

## 2019-10-13 DIAGNOSIS — K5900 Constipation, unspecified: Secondary | ICD-10-CM | POA: Diagnosis not present

## 2019-10-15 DIAGNOSIS — R109 Unspecified abdominal pain: Secondary | ICD-10-CM | POA: Diagnosis not present

## 2019-10-15 DIAGNOSIS — R1084 Generalized abdominal pain: Secondary | ICD-10-CM | POA: Diagnosis not present

## 2019-10-15 DIAGNOSIS — K5904 Chronic idiopathic constipation: Secondary | ICD-10-CM | POA: Diagnosis not present

## 2019-10-15 DIAGNOSIS — E1140 Type 2 diabetes mellitus with diabetic neuropathy, unspecified: Secondary | ICD-10-CM | POA: Diagnosis not present

## 2019-10-15 DIAGNOSIS — Z6824 Body mass index (BMI) 24.0-24.9, adult: Secondary | ICD-10-CM | POA: Diagnosis not present

## 2019-10-16 ENCOUNTER — Other Ambulatory Visit (HOSPITAL_BASED_OUTPATIENT_CLINIC_OR_DEPARTMENT_OTHER): Payer: Self-pay | Admitting: Family Medicine

## 2019-10-16 DIAGNOSIS — R1084 Generalized abdominal pain: Secondary | ICD-10-CM

## 2019-10-20 DIAGNOSIS — Z01812 Encounter for preprocedural laboratory examination: Secondary | ICD-10-CM | POA: Diagnosis not present

## 2019-10-22 ENCOUNTER — Ambulatory Visit (HOSPITAL_BASED_OUTPATIENT_CLINIC_OR_DEPARTMENT_OTHER)
Admission: RE | Admit: 2019-10-22 | Discharge: 2019-10-22 | Disposition: A | Discharge status: Home / Self Care | Payer: Medicare Other | Source: Ambulatory Visit | Attending: Family Medicine | Admitting: Family Medicine

## 2019-10-22 ENCOUNTER — Other Ambulatory Visit: Payer: Self-pay

## 2019-10-22 ENCOUNTER — Encounter (HOSPITAL_BASED_OUTPATIENT_CLINIC_OR_DEPARTMENT_OTHER): Payer: Self-pay

## 2019-10-22 DIAGNOSIS — R1084 Generalized abdominal pain: Secondary | ICD-10-CM | POA: Diagnosis not present

## 2019-10-22 DIAGNOSIS — I700 Atherosclerosis of aorta: Secondary | ICD-10-CM | POA: Diagnosis not present

## 2019-10-22 DIAGNOSIS — N281 Cyst of kidney, acquired: Secondary | ICD-10-CM | POA: Diagnosis not present

## 2019-10-22 DIAGNOSIS — S32592A Other specified fracture of left pubis, initial encounter for closed fracture: Secondary | ICD-10-CM | POA: Diagnosis not present

## 2019-10-22 DIAGNOSIS — K5730 Diverticulosis of large intestine without perforation or abscess without bleeding: Secondary | ICD-10-CM | POA: Diagnosis not present

## 2019-10-22 MED ORDER — IOHEXOL 300 MG/ML  SOLN
100.0000 mL | Freq: Once | INTRAMUSCULAR | Status: AC | PRN
  Administered 2019-10-22: 100 mL via INTRAVENOUS

## 2019-10-26 ENCOUNTER — Other Ambulatory Visit: Payer: Self-pay | Admitting: Obstetrics & Gynecology

## 2019-10-26 DIAGNOSIS — R634 Abnormal weight loss: Secondary | ICD-10-CM | POA: Diagnosis not present

## 2019-10-26 DIAGNOSIS — Z8719 Personal history of other diseases of the digestive system: Secondary | ICD-10-CM | POA: Diagnosis not present

## 2019-10-26 DIAGNOSIS — Z1231 Encounter for screening mammogram for malignant neoplasm of breast: Secondary | ICD-10-CM

## 2019-10-26 DIAGNOSIS — K5900 Constipation, unspecified: Secondary | ICD-10-CM | POA: Diagnosis not present

## 2019-10-26 DIAGNOSIS — R194 Change in bowel habit: Secondary | ICD-10-CM | POA: Diagnosis not present

## 2019-11-03 DIAGNOSIS — E1140 Type 2 diabetes mellitus with diabetic neuropathy, unspecified: Secondary | ICD-10-CM | POA: Diagnosis not present

## 2019-11-03 DIAGNOSIS — M47896 Other spondylosis, lumbar region: Secondary | ICD-10-CM | POA: Diagnosis not present

## 2019-11-03 DIAGNOSIS — M4722 Other spondylosis with radiculopathy, cervical region: Secondary | ICD-10-CM | POA: Diagnosis not present

## 2019-11-03 DIAGNOSIS — E039 Hypothyroidism, unspecified: Secondary | ICD-10-CM | POA: Diagnosis not present

## 2019-11-03 DIAGNOSIS — M1712 Unilateral primary osteoarthritis, left knee: Secondary | ICD-10-CM | POA: Diagnosis not present

## 2019-11-03 DIAGNOSIS — E7800 Pure hypercholesterolemia, unspecified: Secondary | ICD-10-CM | POA: Diagnosis not present

## 2019-11-03 DIAGNOSIS — D508 Other iron deficiency anemias: Secondary | ICD-10-CM | POA: Diagnosis not present

## 2019-11-03 DIAGNOSIS — I10 Essential (primary) hypertension: Secondary | ICD-10-CM | POA: Diagnosis not present

## 2019-11-05 ENCOUNTER — Other Ambulatory Visit: Payer: Self-pay

## 2019-11-05 ENCOUNTER — Ambulatory Visit (INDEPENDENT_AMBULATORY_CARE_PROVIDER_SITE_OTHER): Payer: Medicare Other | Admitting: Neurology

## 2019-11-05 ENCOUNTER — Encounter: Payer: Self-pay | Admitting: Neurology

## 2019-11-05 ENCOUNTER — Telehealth: Payer: Self-pay | Admitting: Neurology

## 2019-11-05 VITALS — Ht 66.0 in | Wt 148.5 lb

## 2019-11-05 DIAGNOSIS — R413 Other amnesia: Secondary | ICD-10-CM | POA: Diagnosis not present

## 2019-11-05 NOTE — Progress Notes (Signed)
Chief Complaint  Patient presents with  . New Patient (Initial Visit)    MMSE 29/30 - 20 animals. She is here to have her mild, slowly declining short term memory evaluated.  Marland Kitchen PCP    Kathyrn Lass, MD    HISTORICAL  Michele Dawson is a 79 year old female, seen in request by her primary care physician Dr. Kathyrn Lass, for evaluation of memory loss, initial evaluation was on November 05, 2019  I reviewed and summarized the referring note. Past medical history Hyperlipidemia Hypertension Hypothyroidism, on supplement Diabetes  She retired from Psychologist, prison and probation services at [employer], moved to [place], moved to [place] around 2018 to be close to her daughter, she reported, but daughter noticed subtle changes in her memory, prompted her office visit, but she does not want her daughter to be with her during today's visit,  She denies any significant difficulty, she lives alone, still driving, able to keep up looking balance, active at home, spends most of the time reading, she did realize, when she was checked on what she was read, she often cannot recall  Today's Mini-Mental Status Examination is 29/30, animal naming of 20, there was no family history of memory loss  Since pandemic, she unintentionally lost about 90 pounds over 18 months span, she has stopped going out with her friends, also stopped drinking alcohol regularly since April 2020, she tends to eat simple meals,  Laboratory evaluations in 2021, BMP showed mild elevated glucose 169, CBC showed mild decreased hemoglobin of 11.7, A1c was 5.03 Jul 2019, A1c was 6.3, normal TSH 2.5, B12 more than 1500  Past Medical History:  Diagnosis Date  . Arthritis   . Diabetes mellitus    Type II  . Diverticulosis   . Dyspnea    climbing stairs  . Family history of malignant neoplasm of gastrointestinal tract   . GERD (gastroesophageal reflux disease)    Maalox prn  . Hyperlipidemia   . Hypertension   . Memory  loss   . Rectal bleeding 11/12/2018   Past Surgical History:  Procedure Laterality Date  . BREAST BIOPSY Right    2017-2018  . CARPAL TUNNEL RELEASE Left    left  . COLONOSCOPY    . DIAGNOSTIC LAPAROSCOPY  10/27/2018  . KNEE ARTHROSCOPY Left   . LAPAROSCOPY N/A 10/27/2018   Procedure: DIAGNOSTIC LAPAROSCOPCY;  Surgeon: Coralie Keens, MD;  Location: [facility];  Service: General;  Laterality: N/A;  . NASAL SEPTUM SURGERY    . TOTAL KNEE ARTHROPLASTY Left 2009   left  . TOTAL KNEE REVISION Left 06/10/2019   Procedure: Left knee polyethylene exchange;  Surgeon: Gaynelle Arabian, MD;  Location: [facility];  Service: Orthopedics;  Laterality: Left;  13min  . VAGINAL HYSTERECTOMY     with anterior and posterior vaginal repair     REVIEW OF SYSTEMS: Full 14 system review of systems performed and notable only for as above All other review of systems were negative.  ALLERGIES: Allergies  Allergen Reactions  . Shellfish Allergy Hives  . Sulfa Antibiotics Hives    blotches  . Penicillins Rash    Did it involve swelling of the face/tongue/throat, SOB, or low BP? Yes Did it involve sudden or severe rash/hives, skin peeling, or any reaction on the inside of your mouth or nose? Unk Did you need to seek medical attention at a hospital or doctor's office? Unk When did it last happen?25 years ago If all above answers are "NO", may proceed with cephalosporin  use.      HOME MEDICATIONS: Current Outpatient Medications  Medication Sig Dispense Refill  . acetaminophen (TYLENOL) 500 MG tablet Take 500-1,000 mg by mouth daily as needed (pain or headaches).     Marland Kitchen amoxicillin (AMOXIL) 500 MG capsule Take 2,000 mg by mouth See admin instructions. Take 2,000 mg by mouth one hour prior to dental appointments    . atorvastatin (LIPITOR) 80 MG tablet Take 80 mg by mouth daily.      Marland Kitchen BLINK TEARS 0.25 % SOLN Place 1 drop into both eyes 3 (three) times daily as needed (dry/irritated eyes.).    Marland Kitchen  Cholecalciferol (VITAMIN D-3) 125 MCG (5000 UT) TABS Take 5,000 Units by mouth daily.    . clobetasol cream (TEMOVATE) 7.03 % Apply 1 application topically daily as needed (vulva irritation.).     Marland Kitchen Cyanocobalamin (B-12) 5000 MCG CAPS Take 5,000 mcg by mouth daily.    . fluticasone (FLONASE) 50 MCG/ACT nasal spray Place 1-2 sprays into both nostrils daily as needed (for seasonal allergies).     . hydrochlorothiazide (MICROZIDE) 12.5 MG capsule Take 12.5 mg by mouth daily.    Marland Kitchen levothyroxine (SYNTHROID, LEVOTHROID) 125 MCG tablet Take 125 mcg by mouth daily before breakfast.     . lisinopril (ZESTRIL) 40 MG tablet Take 80 mg by mouth daily.     Marland Kitchen loratadine (CLARITIN) 10 MG tablet Take 10 mg by mouth daily as needed for allergies.    . metFORMIN (GLUCOPHAGE) 500 MG tablet Take 500 mg by mouth daily with breakfast.     . polyethylene glycol (MIRALAX / GLYCOLAX) 17 g packet Take 17 g by mouth daily as needed (constipation.).    Marland Kitchen Probiotic Product (PROBIOTIC PO) Take 1 capsule by mouth daily. Metagenics UltraFlora IB    . simethicone (GAS-X EXTRA STRENGTH) 125 MG chewable tablet Chew 250 mg by mouth every 6 (six) hours as needed for flatulence.     No current facility-administered medications for this visit.    PAST MEDICAL HISTORY: Past Medical History:  Diagnosis Date  . Arthritis   . Diabetes mellitus    Type II  . Diverticulosis   . Dyspnea    climbing stairs  . Family history of malignant neoplasm of gastrointestinal tract   . GERD (gastroesophageal reflux disease)    Maalox prn  . Hyperlipidemia   . Hypertension   . Memory loss   . Rectal bleeding 11/12/2018    PAST SURGICAL HISTORY: Past Surgical History:  Procedure Laterality Date  . BREAST BIOPSY Right    2017-2018  . CARPAL TUNNEL RELEASE Left    left  . COLONOSCOPY    . DIAGNOSTIC LAPAROSCOPY  10/27/2018  . KNEE ARTHROSCOPY Left   . LAPAROSCOPY N/A 10/27/2018   Procedure: DIAGNOSTIC LAPAROSCOPCY;  Surgeon:  Coralie Keens, MD;  Location: [facility];  Service: General;  Laterality: N/A;  . NASAL SEPTUM SURGERY    . TOTAL KNEE ARTHROPLASTY Left 2009   left  . TOTAL KNEE REVISION Left 06/10/2019   Procedure: Left knee polyethylene exchange;  Surgeon: Gaynelle Arabian, MD;  Location: [facility];  Service: Orthopedics;  Laterality: Left;  27min  . VAGINAL HYSTERECTOMY     with anterior and posterior vaginal repair    FAMILY HISTORY: Family History  Problem Relation Age of Onset  . Colon cancer Sister   . Breast cancer Mother   . Tongue cancer Father   . Heart disease Father   . Breast cancer Other  aunt x 2    SOCIAL HISTORY: Social History   Socioeconomic History  . Marital status: Divorced    Spouse name: Not on file  . Number of children: 2  . Years of education: college - some graduate work  . Highest education level: Bachelor's degree (e.g., BA, AB, BS)  Occupational History  . Occupation: retired  Tobacco Use  . Smoking status: Never Smoker  . Smokeless tobacco: Never Used  Vaping Use  . Vaping Use: Never used  Substance and Sexual Activity  . Alcohol use: Yes    Comment: social  . Drug use: Never  . Sexual activity: Not on file  Other Topics Concern  . Not on file  Social History Narrative   Lives at home with her pet cat.   Left-handed.   Caffeine use: 2 cups per day.   Social Determinants of Health   Financial Resource Strain:   . Difficulty of Paying Living Expenses: Not on file  Food Insecurity:   . Worried About Charity fundraiser in the Last Year: Not on file  . Ran Out of Food in the Last Year: Not on file  Transportation Needs:   . Lack of Transportation (Medical): Not on file  . Lack of Transportation (Non-Medical): Not on file  Physical Activity:   . Days of Exercise per Week: Not on file  . Minutes of Exercise per Session: Not on file  Stress:   . Feeling of Stress : Not on file  Social Connections:   . Frequency of Communication with Friends  and Family: Not on file  . Frequency of Social Gatherings with Friends and Family: Not on file  . Attends Religious Services: Not on file  . Active Member of Clubs or Organizations: Not on file  . Attends Archivist Meetings: Not on file  . Marital Status: Not on file  Intimate Partner Violence:   . Fear of Current or Ex-Partner: Not on file  . Emotionally Abused: Not on file  . Physically Abused: Not on file  . Sexually Abused: Not on file     PHYSICAL EXAM   Vitals:   11/05/19 1057  Weight: 148 lb 8 oz (67.4 kg)  Height: 5\' 6"  (1.676 m)   Not recorded     Body mass index is 23.97 kg/m.  PHYSICAL EXAMNIATION:  Gen: NAD, conversant, well nourised, well groomed                     Cardiovascular: Regular rate rhythm, no peripheral edema, warm, nontender. Eyes: Conjunctivae clear without exudates or hemorrhage Neck: Supple, no carotid bruits. Pulmonary: Clear to auscultation bilaterally   NEUROLOGICAL EXAM:  MENTAL STATUS: MMSE - Mini Mental State Exam 11/05/2019  Orientation to time 5  Orientation to Place 5  Registration 3  Attention/ Calculation 5  Recall 2  Language- name 2 objects 2  Language- repeat 1  Language- follow 3 step command 3  Language- read & follow direction 1  Write a sentence 1  Copy design 1  Total score 29  Animal naming 20.   CRANIAL NERVES: CN II: Visual fields are full to confrontation. Pupils are round equal and briskly reactive to light. CN III, IV, VI: extraocular movement are normal. No ptosis. CN V: Facial sensation is intact to light touch CN VII: Face is symmetric with normal eye closure  CN VIII: Hearing is normal to causal conversation. CN IX, X: Phonation is normal. CN XI: Head turning  and shoulder shrug are intact  MOTOR: There is no pronator drift of out-stretched arms. Muscle bulk and tone are normal. Muscle strength is normal.  REFLEXES: Reflexes are 2+ and symmetric at the biceps, triceps, knees, and  ankles. Plantar responses are flexor.  SENSORY: Intact to light touch, pinprick and vibratory sensation are intact in fingers and toes.  COORDINATION: There is no trunk or limb dysmetria noted.  GAIT/STANCE: She can get up from seated position arms crossed, dragging right leg some, had incidental right fifth toe fracture,   DIAGNOSTIC DATA (LABS, IMAGING, TESTING) - I reviewed patient records, labs, notes, testing and imaging myself where available.   ASSESSMENT AND PLAN  SYLWIA CUERVO is a 79 y.o. female   Memory loss  Mini-Mental Status Examination 29/30,animal naming 20  Laboratory evaluation showed no treatable etiology  MRI of the brain to rule out structural lesion,    Marcial Pacas, M.D. Ph.D.  [facility] [address] [place],  [address] Ph: [phone] Fax: [phone]  CC:  Kathyrn Lass, [place],   [id number]

## 2019-11-05 NOTE — Telephone Encounter (Signed)
Medicare/bcbs fed order sent to GI. No auth they will reach out to the patient to schedule.  °

## 2019-11-06 ENCOUNTER — Other Ambulatory Visit: Payer: Self-pay | Admitting: Neurology

## 2019-11-06 NOTE — Telephone Encounter (Signed)
Pt is asking that a sedative be called in for her MRI next month, please call into [facility] [id number] [place]

## 2019-11-06 NOTE — Addendum Note (Signed)
Addended by: Noberto Retort C on: 11/06/2019 01:10 PM   Modules accepted: Orders

## 2019-11-06 NOTE — Telephone Encounter (Signed)
I returned the call to the patient. She confirmed the allergies in her chart. She is aware that Xanax will be sent to the pharmacy, per MRI protocol. She verbalized understanding that she must have a driver.

## 2019-11-09 MED ORDER — ALPRAZOLAM 1 MG PO TABS: ORAL_TABLET | ORAL | 0 refills | Status: DC

## 2019-11-14 DIAGNOSIS — J029 Acute pharyngitis, unspecified: Secondary | ICD-10-CM | POA: Diagnosis not present

## 2019-11-14 DIAGNOSIS — Z03818 Encounter for observation for suspected exposure to other biological agents ruled out: Secondary | ICD-10-CM | POA: Diagnosis not present

## 2019-11-17 ENCOUNTER — Ambulatory Visit
Admission: RE | Admit: 2019-11-17 | Discharge: 2019-11-17 | Disposition: A | Discharge status: Home / Self Care | Payer: Medicare Other | Source: Ambulatory Visit | Attending: Neurology | Admitting: Neurology

## 2019-11-17 ENCOUNTER — Other Ambulatory Visit: Payer: Self-pay

## 2019-11-17 DIAGNOSIS — R413 Other amnesia: Secondary | ICD-10-CM

## 2019-12-03 ENCOUNTER — Other Ambulatory Visit: Payer: Self-pay

## 2019-12-03 ENCOUNTER — Ambulatory Visit
Admission: RE | Admit: 2019-12-03 | Discharge: 2019-12-03 | Disposition: A | Discharge status: Home / Self Care | Payer: Medicare Other | Source: Ambulatory Visit | Attending: Obstetrics & Gynecology | Admitting: Obstetrics & Gynecology

## 2019-12-03 DIAGNOSIS — Z1231 Encounter for screening mammogram for malignant neoplasm of breast: Secondary | ICD-10-CM

## 2019-12-22 DIAGNOSIS — Z6823 Body mass index (BMI) 23.0-23.9, adult: Secondary | ICD-10-CM | POA: Diagnosis not present

## 2019-12-22 DIAGNOSIS — Z01419 Encounter for gynecological examination (general) (routine) without abnormal findings: Secondary | ICD-10-CM | POA: Diagnosis not present

## 2019-12-25 DIAGNOSIS — E1140 Type 2 diabetes mellitus with diabetic neuropathy, unspecified: Secondary | ICD-10-CM | POA: Diagnosis not present

## 2019-12-25 DIAGNOSIS — D508 Other iron deficiency anemias: Secondary | ICD-10-CM | POA: Diagnosis not present

## 2019-12-25 DIAGNOSIS — I10 Essential (primary) hypertension: Secondary | ICD-10-CM | POA: Diagnosis not present

## 2019-12-25 DIAGNOSIS — E039 Hypothyroidism, unspecified: Secondary | ICD-10-CM | POA: Diagnosis not present

## 2019-12-25 DIAGNOSIS — E7800 Pure hypercholesterolemia, unspecified: Secondary | ICD-10-CM | POA: Diagnosis not present

## 2019-12-25 DIAGNOSIS — M1712 Unilateral primary osteoarthritis, left knee: Secondary | ICD-10-CM | POA: Diagnosis not present

## 2019-12-25 DIAGNOSIS — M4722 Other spondylosis with radiculopathy, cervical region: Secondary | ICD-10-CM | POA: Diagnosis not present

## 2019-12-25 DIAGNOSIS — M47896 Other spondylosis, lumbar region: Secondary | ICD-10-CM | POA: Diagnosis not present

## 2019-12-31 ENCOUNTER — Ambulatory Visit: Payer: Medicare Other

## 2020-01-01 ENCOUNTER — Ambulatory Visit: Payer: Medicare Other

## 2020-01-13 DIAGNOSIS — Z23 Encounter for immunization: Secondary | ICD-10-CM | POA: Diagnosis not present

## 2020-01-13 DIAGNOSIS — R634 Abnormal weight loss: Secondary | ICD-10-CM | POA: Diagnosis not present

## 2020-01-13 DIAGNOSIS — I10 Essential (primary) hypertension: Secondary | ICD-10-CM | POA: Diagnosis not present

## 2020-01-13 DIAGNOSIS — R202 Paresthesia of skin: Secondary | ICD-10-CM | POA: Diagnosis not present

## 2020-01-13 DIAGNOSIS — E039 Hypothyroidism, unspecified: Secondary | ICD-10-CM | POA: Diagnosis not present

## 2020-01-13 DIAGNOSIS — E7800 Pure hypercholesterolemia, unspecified: Secondary | ICD-10-CM | POA: Diagnosis not present

## 2020-01-13 DIAGNOSIS — Z6823 Body mass index (BMI) 23.0-23.9, adult: Secondary | ICD-10-CM | POA: Diagnosis not present

## 2020-01-13 DIAGNOSIS — E113291 Type 2 diabetes mellitus with mild nonproliferative diabetic retinopathy without macular edema, right eye: Secondary | ICD-10-CM | POA: Diagnosis not present

## 2020-01-18 DIAGNOSIS — K219 Gastro-esophageal reflux disease without esophagitis: Secondary | ICD-10-CM | POA: Diagnosis not present

## 2020-01-18 DIAGNOSIS — E1140 Type 2 diabetes mellitus with diabetic neuropathy, unspecified: Secondary | ICD-10-CM | POA: Diagnosis not present

## 2020-01-18 DIAGNOSIS — M8580 Other specified disorders of bone density and structure, unspecified site: Secondary | ICD-10-CM | POA: Diagnosis not present

## 2020-01-18 DIAGNOSIS — D508 Other iron deficiency anemias: Secondary | ICD-10-CM | POA: Diagnosis not present

## 2020-01-18 DIAGNOSIS — M47896 Other spondylosis, lumbar region: Secondary | ICD-10-CM | POA: Diagnosis not present

## 2020-01-18 DIAGNOSIS — M4722 Other spondylosis with radiculopathy, cervical region: Secondary | ICD-10-CM | POA: Diagnosis not present

## 2020-01-18 DIAGNOSIS — E039 Hypothyroidism, unspecified: Secondary | ICD-10-CM | POA: Diagnosis not present

## 2020-01-18 DIAGNOSIS — G8929 Other chronic pain: Secondary | ICD-10-CM | POA: Diagnosis not present

## 2020-01-18 DIAGNOSIS — I10 Essential (primary) hypertension: Secondary | ICD-10-CM | POA: Diagnosis not present

## 2020-01-18 DIAGNOSIS — M1712 Unilateral primary osteoarthritis, left knee: Secondary | ICD-10-CM | POA: Diagnosis not present

## 2020-01-18 DIAGNOSIS — E7800 Pure hypercholesterolemia, unspecified: Secondary | ICD-10-CM | POA: Diagnosis not present

## 2020-02-01 ENCOUNTER — Other Ambulatory Visit: Payer: Self-pay

## 2020-02-01 ENCOUNTER — Ambulatory Visit
Admission: RE | Admit: 2020-02-01 | Discharge: 2020-02-01 | Disposition: A | Discharge status: Home / Self Care | Payer: Medicare Other | Source: Ambulatory Visit | Attending: Obstetrics & Gynecology | Admitting: Obstetrics & Gynecology

## 2020-02-01 DIAGNOSIS — Z1231 Encounter for screening mammogram for malignant neoplasm of breast: Secondary | ICD-10-CM | POA: Diagnosis not present

## 2020-02-02 DIAGNOSIS — M4722 Other spondylosis with radiculopathy, cervical region: Secondary | ICD-10-CM | POA: Diagnosis not present

## 2020-02-02 DIAGNOSIS — E039 Hypothyroidism, unspecified: Secondary | ICD-10-CM | POA: Diagnosis not present

## 2020-02-02 DIAGNOSIS — E7800 Pure hypercholesterolemia, unspecified: Secondary | ICD-10-CM | POA: Diagnosis not present

## 2020-02-02 DIAGNOSIS — G8929 Other chronic pain: Secondary | ICD-10-CM | POA: Diagnosis not present

## 2020-02-02 DIAGNOSIS — D508 Other iron deficiency anemias: Secondary | ICD-10-CM | POA: Diagnosis not present

## 2020-02-02 DIAGNOSIS — M8580 Other specified disorders of bone density and structure, unspecified site: Secondary | ICD-10-CM | POA: Diagnosis not present

## 2020-02-02 DIAGNOSIS — E1140 Type 2 diabetes mellitus with diabetic neuropathy, unspecified: Secondary | ICD-10-CM | POA: Diagnosis not present

## 2020-02-02 DIAGNOSIS — M1712 Unilateral primary osteoarthritis, left knee: Secondary | ICD-10-CM | POA: Diagnosis not present

## 2020-02-02 DIAGNOSIS — K219 Gastro-esophageal reflux disease without esophagitis: Secondary | ICD-10-CM | POA: Diagnosis not present

## 2020-02-02 DIAGNOSIS — M47896 Other spondylosis, lumbar region: Secondary | ICD-10-CM | POA: Diagnosis not present

## 2020-02-02 DIAGNOSIS — I10 Essential (primary) hypertension: Secondary | ICD-10-CM | POA: Diagnosis not present

## 2020-02-04 DIAGNOSIS — Z1159 Encounter for screening for other viral diseases: Secondary | ICD-10-CM | POA: Diagnosis not present

## 2020-02-08 DIAGNOSIS — K625 Hemorrhage of anus and rectum: Secondary | ICD-10-CM | POA: Diagnosis not present

## 2020-02-08 DIAGNOSIS — D123 Benign neoplasm of transverse colon: Secondary | ICD-10-CM | POA: Diagnosis not present

## 2020-02-08 DIAGNOSIS — K5730 Diverticulosis of large intestine without perforation or abscess without bleeding: Secondary | ICD-10-CM | POA: Diagnosis not present

## 2020-02-08 DIAGNOSIS — K648 Other hemorrhoids: Secondary | ICD-10-CM | POA: Diagnosis not present

## 2020-02-08 DIAGNOSIS — D120 Benign neoplasm of cecum: Secondary | ICD-10-CM | POA: Diagnosis not present

## 2020-02-08 DIAGNOSIS — R194 Change in bowel habit: Secondary | ICD-10-CM | POA: Diagnosis not present

## 2020-02-08 DIAGNOSIS — K529 Noninfective gastroenteritis and colitis, unspecified: Secondary | ICD-10-CM | POA: Diagnosis not present

## 2020-02-08 DIAGNOSIS — R195 Other fecal abnormalities: Secondary | ICD-10-CM | POA: Diagnosis not present

## 2020-02-11 DIAGNOSIS — D120 Benign neoplasm of cecum: Secondary | ICD-10-CM | POA: Diagnosis not present

## 2020-02-11 DIAGNOSIS — D123 Benign neoplasm of transverse colon: Secondary | ICD-10-CM | POA: Diagnosis not present

## 2020-02-25 DIAGNOSIS — K5900 Constipation, unspecified: Secondary | ICD-10-CM | POA: Diagnosis not present

## 2020-02-25 DIAGNOSIS — K5730 Diverticulosis of large intestine without perforation or abscess without bleeding: Secondary | ICD-10-CM | POA: Diagnosis not present

## 2020-02-25 DIAGNOSIS — R1032 Left lower quadrant pain: Secondary | ICD-10-CM | POA: Diagnosis not present

## 2020-02-29 DIAGNOSIS — R1032 Left lower quadrant pain: Secondary | ICD-10-CM | POA: Diagnosis not present

## 2020-02-29 DIAGNOSIS — Z6822 Body mass index (BMI) 22.0-22.9, adult: Secondary | ICD-10-CM | POA: Diagnosis not present

## 2020-03-03 DIAGNOSIS — M25562 Pain in left knee: Secondary | ICD-10-CM | POA: Diagnosis not present

## 2020-03-16 DIAGNOSIS — R1032 Left lower quadrant pain: Secondary | ICD-10-CM | POA: Diagnosis not present

## 2020-03-16 DIAGNOSIS — I10 Essential (primary) hypertension: Secondary | ICD-10-CM | POA: Diagnosis not present

## 2020-03-16 DIAGNOSIS — E039 Hypothyroidism, unspecified: Secondary | ICD-10-CM | POA: Diagnosis not present

## 2020-03-16 DIAGNOSIS — K5904 Chronic idiopathic constipation: Secondary | ICD-10-CM | POA: Diagnosis not present

## 2020-03-18 DIAGNOSIS — Z96652 Presence of left artificial knee joint: Secondary | ICD-10-CM | POA: Diagnosis not present

## 2020-03-21 DIAGNOSIS — D508 Other iron deficiency anemias: Secondary | ICD-10-CM | POA: Diagnosis not present

## 2020-03-21 DIAGNOSIS — K219 Gastro-esophageal reflux disease without esophagitis: Secondary | ICD-10-CM | POA: Diagnosis not present

## 2020-03-21 DIAGNOSIS — E039 Hypothyroidism, unspecified: Secondary | ICD-10-CM | POA: Diagnosis not present

## 2020-03-21 DIAGNOSIS — M4722 Other spondylosis with radiculopathy, cervical region: Secondary | ICD-10-CM | POA: Diagnosis not present

## 2020-03-21 DIAGNOSIS — I10 Essential (primary) hypertension: Secondary | ICD-10-CM | POA: Diagnosis not present

## 2020-03-21 DIAGNOSIS — M47896 Other spondylosis, lumbar region: Secondary | ICD-10-CM | POA: Diagnosis not present

## 2020-03-21 DIAGNOSIS — E1140 Type 2 diabetes mellitus with diabetic neuropathy, unspecified: Secondary | ICD-10-CM | POA: Diagnosis not present

## 2020-03-21 DIAGNOSIS — E7800 Pure hypercholesterolemia, unspecified: Secondary | ICD-10-CM | POA: Diagnosis not present

## 2020-03-21 DIAGNOSIS — M1712 Unilateral primary osteoarthritis, left knee: Secondary | ICD-10-CM | POA: Diagnosis not present

## 2020-03-21 DIAGNOSIS — M8580 Other specified disorders of bone density and structure, unspecified site: Secondary | ICD-10-CM | POA: Diagnosis not present

## 2020-03-21 DIAGNOSIS — G8929 Other chronic pain: Secondary | ICD-10-CM | POA: Diagnosis not present

## 2020-03-31 ENCOUNTER — Other Ambulatory Visit: Payer: Medicare Other

## 2020-03-31 DIAGNOSIS — Z20822 Contact with and (suspected) exposure to covid-19: Secondary | ICD-10-CM

## 2020-04-01 LAB — NOVEL CORONAVIRUS, NAA: SARS-CoV-2, NAA: NOT DETECTED

## 2020-04-01 LAB — SARS-COV-2, NAA 2 DAY TAT

## 2020-04-05 DIAGNOSIS — E119 Type 2 diabetes mellitus without complications: Secondary | ICD-10-CM | POA: Diagnosis not present

## 2020-04-05 DIAGNOSIS — H2513 Age-related nuclear cataract, bilateral: Secondary | ICD-10-CM | POA: Diagnosis not present

## 2020-04-05 DIAGNOSIS — H5213 Myopia, bilateral: Secondary | ICD-10-CM | POA: Diagnosis not present

## 2020-04-05 DIAGNOSIS — H524 Presbyopia: Secondary | ICD-10-CM | POA: Diagnosis not present

## 2020-04-05 DIAGNOSIS — H35033 Hypertensive retinopathy, bilateral: Secondary | ICD-10-CM | POA: Diagnosis not present

## 2020-04-05 DIAGNOSIS — H52223 Regular astigmatism, bilateral: Secondary | ICD-10-CM | POA: Diagnosis not present

## 2020-04-05 DIAGNOSIS — I10 Essential (primary) hypertension: Secondary | ICD-10-CM | POA: Diagnosis not present

## 2020-04-13 DIAGNOSIS — E039 Hypothyroidism, unspecified: Secondary | ICD-10-CM | POA: Diagnosis not present

## 2020-04-13 DIAGNOSIS — R413 Other amnesia: Secondary | ICD-10-CM | POA: Diagnosis not present

## 2020-04-13 DIAGNOSIS — I10 Essential (primary) hypertension: Secondary | ICD-10-CM | POA: Diagnosis not present

## 2020-04-13 DIAGNOSIS — R202 Paresthesia of skin: Secondary | ICD-10-CM | POA: Diagnosis not present

## 2020-04-13 DIAGNOSIS — K5900 Constipation, unspecified: Secondary | ICD-10-CM | POA: Diagnosis not present

## 2020-04-13 DIAGNOSIS — Z6822 Body mass index (BMI) 22.0-22.9, adult: Secondary | ICD-10-CM | POA: Diagnosis not present

## 2020-04-15 DIAGNOSIS — Z96652 Presence of left artificial knee joint: Secondary | ICD-10-CM | POA: Diagnosis not present

## 2020-05-03 DIAGNOSIS — M25562 Pain in left knee: Secondary | ICD-10-CM | POA: Diagnosis not present

## 2020-05-03 DIAGNOSIS — R2689 Other abnormalities of gait and mobility: Secondary | ICD-10-CM | POA: Diagnosis not present

## 2020-05-06 DIAGNOSIS — L900 Lichen sclerosus et atrophicus: Secondary | ICD-10-CM | POA: Diagnosis not present

## 2020-05-06 DIAGNOSIS — M25562 Pain in left knee: Secondary | ICD-10-CM | POA: Diagnosis not present

## 2020-05-06 DIAGNOSIS — Z6822 Body mass index (BMI) 22.0-22.9, adult: Secondary | ICD-10-CM | POA: Diagnosis not present

## 2020-05-06 DIAGNOSIS — R2689 Other abnormalities of gait and mobility: Secondary | ICD-10-CM | POA: Diagnosis not present

## 2020-05-09 DIAGNOSIS — M25562 Pain in left knee: Secondary | ICD-10-CM | POA: Diagnosis not present

## 2020-05-09 DIAGNOSIS — R2689 Other abnormalities of gait and mobility: Secondary | ICD-10-CM | POA: Diagnosis not present

## 2020-05-16 DIAGNOSIS — M25562 Pain in left knee: Secondary | ICD-10-CM | POA: Diagnosis not present

## 2020-05-16 DIAGNOSIS — R2689 Other abnormalities of gait and mobility: Secondary | ICD-10-CM | POA: Diagnosis not present

## 2020-05-18 DIAGNOSIS — J069 Acute upper respiratory infection, unspecified: Secondary | ICD-10-CM | POA: Diagnosis not present

## 2020-05-18 DIAGNOSIS — Z6822 Body mass index (BMI) 22.0-22.9, adult: Secondary | ICD-10-CM | POA: Diagnosis not present

## 2020-05-18 DIAGNOSIS — R591 Generalized enlarged lymph nodes: Secondary | ICD-10-CM | POA: Diagnosis not present

## 2020-05-19 DIAGNOSIS — R2689 Other abnormalities of gait and mobility: Secondary | ICD-10-CM | POA: Diagnosis not present

## 2020-05-19 DIAGNOSIS — M25562 Pain in left knee: Secondary | ICD-10-CM | POA: Diagnosis not present

## 2020-05-20 DIAGNOSIS — D508 Other iron deficiency anemias: Secondary | ICD-10-CM | POA: Diagnosis not present

## 2020-05-20 DIAGNOSIS — E039 Hypothyroidism, unspecified: Secondary | ICD-10-CM | POA: Diagnosis not present

## 2020-05-20 DIAGNOSIS — E7800 Pure hypercholesterolemia, unspecified: Secondary | ICD-10-CM | POA: Diagnosis not present

## 2020-05-20 DIAGNOSIS — M47896 Other spondylosis, lumbar region: Secondary | ICD-10-CM | POA: Diagnosis not present

## 2020-05-20 DIAGNOSIS — M1712 Unilateral primary osteoarthritis, left knee: Secondary | ICD-10-CM | POA: Diagnosis not present

## 2020-05-20 DIAGNOSIS — E1140 Type 2 diabetes mellitus with diabetic neuropathy, unspecified: Secondary | ICD-10-CM | POA: Diagnosis not present

## 2020-05-20 DIAGNOSIS — K219 Gastro-esophageal reflux disease without esophagitis: Secondary | ICD-10-CM | POA: Diagnosis not present

## 2020-05-20 DIAGNOSIS — I10 Essential (primary) hypertension: Secondary | ICD-10-CM | POA: Diagnosis not present

## 2020-05-20 DIAGNOSIS — G8929 Other chronic pain: Secondary | ICD-10-CM | POA: Diagnosis not present

## 2020-05-20 DIAGNOSIS — M8580 Other specified disorders of bone density and structure, unspecified site: Secondary | ICD-10-CM | POA: Diagnosis not present

## 2020-05-25 DIAGNOSIS — R2689 Other abnormalities of gait and mobility: Secondary | ICD-10-CM | POA: Diagnosis not present

## 2020-05-25 DIAGNOSIS — M25562 Pain in left knee: Secondary | ICD-10-CM | POA: Diagnosis not present

## 2020-05-27 DIAGNOSIS — Z96652 Presence of left artificial knee joint: Secondary | ICD-10-CM | POA: Diagnosis not present

## 2020-05-30 DIAGNOSIS — M25562 Pain in left knee: Secondary | ICD-10-CM | POA: Diagnosis not present

## 2020-05-30 DIAGNOSIS — R2689 Other abnormalities of gait and mobility: Secondary | ICD-10-CM | POA: Diagnosis not present

## 2020-06-01 ENCOUNTER — Other Ambulatory Visit: Payer: Self-pay | Admitting: Family Medicine

## 2020-06-01 DIAGNOSIS — R590 Localized enlarged lymph nodes: Secondary | ICD-10-CM | POA: Diagnosis not present

## 2020-06-01 DIAGNOSIS — Z6822 Body mass index (BMI) 22.0-22.9, adult: Secondary | ICD-10-CM | POA: Diagnosis not present

## 2020-06-02 DIAGNOSIS — R2689 Other abnormalities of gait and mobility: Secondary | ICD-10-CM | POA: Diagnosis not present

## 2020-06-02 DIAGNOSIS — M25562 Pain in left knee: Secondary | ICD-10-CM | POA: Diagnosis not present

## 2020-06-08 DIAGNOSIS — M25562 Pain in left knee: Secondary | ICD-10-CM | POA: Diagnosis not present

## 2020-06-08 DIAGNOSIS — R2689 Other abnormalities of gait and mobility: Secondary | ICD-10-CM | POA: Diagnosis not present

## 2020-06-14 DIAGNOSIS — M25562 Pain in left knee: Secondary | ICD-10-CM | POA: Diagnosis not present

## 2020-06-14 DIAGNOSIS — R2689 Other abnormalities of gait and mobility: Secondary | ICD-10-CM | POA: Diagnosis not present

## 2020-06-16 DIAGNOSIS — M25562 Pain in left knee: Secondary | ICD-10-CM | POA: Diagnosis not present

## 2020-06-16 DIAGNOSIS — R2689 Other abnormalities of gait and mobility: Secondary | ICD-10-CM | POA: Diagnosis not present

## 2020-06-17 ENCOUNTER — Inpatient Hospital Stay: Admission: RE | Admit: 2020-06-17 | Payer: Medicare Other | Source: Ambulatory Visit

## 2020-06-21 DIAGNOSIS — R2689 Other abnormalities of gait and mobility: Secondary | ICD-10-CM | POA: Diagnosis not present

## 2020-06-21 DIAGNOSIS — M25562 Pain in left knee: Secondary | ICD-10-CM | POA: Diagnosis not present

## 2020-06-22 DIAGNOSIS — K5900 Constipation, unspecified: Secondary | ICD-10-CM | POA: Diagnosis not present

## 2020-06-22 DIAGNOSIS — K219 Gastro-esophageal reflux disease without esophagitis: Secondary | ICD-10-CM | POA: Diagnosis not present

## 2020-06-22 DIAGNOSIS — E1140 Type 2 diabetes mellitus with diabetic neuropathy, unspecified: Secondary | ICD-10-CM | POA: Diagnosis not present

## 2020-06-22 DIAGNOSIS — I10 Essential (primary) hypertension: Secondary | ICD-10-CM | POA: Diagnosis not present

## 2020-06-22 DIAGNOSIS — D508 Other iron deficiency anemias: Secondary | ICD-10-CM | POA: Diagnosis not present

## 2020-06-22 DIAGNOSIS — M8580 Other specified disorders of bone density and structure, unspecified site: Secondary | ICD-10-CM | POA: Diagnosis not present

## 2020-06-22 DIAGNOSIS — Z6821 Body mass index (BMI) 21.0-21.9, adult: Secondary | ICD-10-CM | POA: Diagnosis not present

## 2020-06-22 DIAGNOSIS — G8929 Other chronic pain: Secondary | ICD-10-CM | POA: Diagnosis not present

## 2020-06-22 DIAGNOSIS — M4722 Other spondylosis with radiculopathy, cervical region: Secondary | ICD-10-CM | POA: Diagnosis not present

## 2020-06-22 DIAGNOSIS — M1712 Unilateral primary osteoarthritis, left knee: Secondary | ICD-10-CM | POA: Diagnosis not present

## 2020-06-22 DIAGNOSIS — E039 Hypothyroidism, unspecified: Secondary | ICD-10-CM | POA: Diagnosis not present

## 2020-06-22 DIAGNOSIS — R142 Eructation: Secondary | ICD-10-CM | POA: Diagnosis not present

## 2020-06-22 DIAGNOSIS — M47896 Other spondylosis, lumbar region: Secondary | ICD-10-CM | POA: Diagnosis not present

## 2020-06-22 DIAGNOSIS — E7800 Pure hypercholesterolemia, unspecified: Secondary | ICD-10-CM | POA: Diagnosis not present

## 2020-06-28 DIAGNOSIS — R2689 Other abnormalities of gait and mobility: Secondary | ICD-10-CM | POA: Diagnosis not present

## 2020-06-28 DIAGNOSIS — K219 Gastro-esophageal reflux disease without esophagitis: Secondary | ICD-10-CM | POA: Diagnosis not present

## 2020-06-28 DIAGNOSIS — D508 Other iron deficiency anemias: Secondary | ICD-10-CM | POA: Diagnosis not present

## 2020-06-28 DIAGNOSIS — E7800 Pure hypercholesterolemia, unspecified: Secondary | ICD-10-CM | POA: Diagnosis not present

## 2020-06-28 DIAGNOSIS — I10 Essential (primary) hypertension: Secondary | ICD-10-CM | POA: Diagnosis not present

## 2020-06-28 DIAGNOSIS — E039 Hypothyroidism, unspecified: Secondary | ICD-10-CM | POA: Diagnosis not present

## 2020-06-28 DIAGNOSIS — M47896 Other spondylosis, lumbar region: Secondary | ICD-10-CM | POA: Diagnosis not present

## 2020-06-28 DIAGNOSIS — M4722 Other spondylosis with radiculopathy, cervical region: Secondary | ICD-10-CM | POA: Diagnosis not present

## 2020-06-28 DIAGNOSIS — M25562 Pain in left knee: Secondary | ICD-10-CM | POA: Diagnosis not present

## 2020-06-28 DIAGNOSIS — G8929 Other chronic pain: Secondary | ICD-10-CM | POA: Diagnosis not present

## 2020-06-28 DIAGNOSIS — E1140 Type 2 diabetes mellitus with diabetic neuropathy, unspecified: Secondary | ICD-10-CM | POA: Diagnosis not present

## 2020-06-28 DIAGNOSIS — M1712 Unilateral primary osteoarthritis, left knee: Secondary | ICD-10-CM | POA: Diagnosis not present

## 2020-06-28 DIAGNOSIS — M8580 Other specified disorders of bone density and structure, unspecified site: Secondary | ICD-10-CM | POA: Diagnosis not present

## 2020-06-29 ENCOUNTER — Other Ambulatory Visit: Payer: Self-pay

## 2020-06-29 ENCOUNTER — Ambulatory Visit
Admission: RE | Admit: 2020-06-29 | Discharge: 2020-06-29 | Disposition: A | Discharge status: Home / Self Care | Payer: Medicare Other | Source: Ambulatory Visit | Attending: Family Medicine | Admitting: Family Medicine

## 2020-06-29 DIAGNOSIS — R590 Localized enlarged lymph nodes: Secondary | ICD-10-CM

## 2020-06-29 DIAGNOSIS — L040 Acute lymphadenitis of face, head and neck: Secondary | ICD-10-CM | POA: Diagnosis not present

## 2020-06-29 MED ORDER — IOPAMIDOL (ISOVUE-300) INJECTION 61%
75.0000 mL | Freq: Once | INTRAVENOUS | Status: AC | PRN
  Administered 2020-06-29: 75 mL via INTRAVENOUS

## 2020-07-01 ENCOUNTER — Other Ambulatory Visit: Payer: Self-pay | Admitting: Family Medicine

## 2020-07-01 DIAGNOSIS — R9389 Abnormal findings on diagnostic imaging of other specified body structures: Secondary | ICD-10-CM

## 2020-07-06 ENCOUNTER — Ambulatory Visit
Admission: RE | Admit: 2020-07-06 | Discharge: 2020-07-06 | Disposition: A | Discharge status: Home / Self Care | Payer: Medicare Other | Source: Ambulatory Visit | Attending: Family Medicine | Admitting: Family Medicine

## 2020-07-06 DIAGNOSIS — J90 Pleural effusion, not elsewhere classified: Secondary | ICD-10-CM | POA: Diagnosis not present

## 2020-07-06 DIAGNOSIS — J9811 Atelectasis: Secondary | ICD-10-CM | POA: Diagnosis not present

## 2020-07-06 DIAGNOSIS — R9389 Abnormal findings on diagnostic imaging of other specified body structures: Secondary | ICD-10-CM

## 2020-07-06 DIAGNOSIS — I2510 Atherosclerotic heart disease of native coronary artery without angina pectoris: Secondary | ICD-10-CM | POA: Diagnosis not present

## 2020-07-06 DIAGNOSIS — M19012 Primary osteoarthritis, left shoulder: Secondary | ICD-10-CM | POA: Diagnosis not present

## 2020-07-06 MED ORDER — IOPAMIDOL (ISOVUE-300) INJECTION 61%
75.0000 mL | Freq: Once | INTRAVENOUS | Status: AC | PRN
  Administered 2020-07-06: 75 mL via INTRAVENOUS

## 2020-07-20 DIAGNOSIS — K5904 Chronic idiopathic constipation: Secondary | ICD-10-CM | POA: Diagnosis not present

## 2020-07-20 DIAGNOSIS — R109 Unspecified abdominal pain: Secondary | ICD-10-CM | POA: Diagnosis not present

## 2020-07-20 DIAGNOSIS — Z86010 Personal history of colonic polyps: Secondary | ICD-10-CM | POA: Diagnosis not present

## 2020-08-02 DIAGNOSIS — K219 Gastro-esophageal reflux disease without esophagitis: Secondary | ICD-10-CM | POA: Diagnosis not present

## 2020-08-02 DIAGNOSIS — G8929 Other chronic pain: Secondary | ICD-10-CM | POA: Diagnosis not present

## 2020-08-02 DIAGNOSIS — E1140 Type 2 diabetes mellitus with diabetic neuropathy, unspecified: Secondary | ICD-10-CM | POA: Diagnosis not present

## 2020-08-02 DIAGNOSIS — M1712 Unilateral primary osteoarthritis, left knee: Secondary | ICD-10-CM | POA: Diagnosis not present

## 2020-08-02 DIAGNOSIS — M47896 Other spondylosis, lumbar region: Secondary | ICD-10-CM | POA: Diagnosis not present

## 2020-08-02 DIAGNOSIS — D508 Other iron deficiency anemias: Secondary | ICD-10-CM | POA: Diagnosis not present

## 2020-08-02 DIAGNOSIS — E039 Hypothyroidism, unspecified: Secondary | ICD-10-CM | POA: Diagnosis not present

## 2020-08-02 DIAGNOSIS — I10 Essential (primary) hypertension: Secondary | ICD-10-CM | POA: Diagnosis not present

## 2020-08-02 DIAGNOSIS — M8580 Other specified disorders of bone density and structure, unspecified site: Secondary | ICD-10-CM | POA: Diagnosis not present

## 2020-08-02 DIAGNOSIS — E7800 Pure hypercholesterolemia, unspecified: Secondary | ICD-10-CM | POA: Diagnosis not present

## 2020-08-03 DIAGNOSIS — R109 Unspecified abdominal pain: Secondary | ICD-10-CM | POA: Diagnosis not present

## 2020-08-03 DIAGNOSIS — K5904 Chronic idiopathic constipation: Secondary | ICD-10-CM | POA: Diagnosis not present

## 2020-08-04 DIAGNOSIS — M1711 Unilateral primary osteoarthritis, right knee: Secondary | ICD-10-CM | POA: Diagnosis not present

## 2020-08-04 DIAGNOSIS — Z96652 Presence of left artificial knee joint: Secondary | ICD-10-CM | POA: Diagnosis not present

## 2020-08-08 DIAGNOSIS — K5904 Chronic idiopathic constipation: Secondary | ICD-10-CM | POA: Diagnosis not present

## 2020-08-08 DIAGNOSIS — R109 Unspecified abdominal pain: Secondary | ICD-10-CM | POA: Diagnosis not present

## 2020-08-08 DIAGNOSIS — R634 Abnormal weight loss: Secondary | ICD-10-CM | POA: Diagnosis not present

## 2020-08-08 DIAGNOSIS — I499 Cardiac arrhythmia, unspecified: Secondary | ICD-10-CM | POA: Diagnosis not present

## 2020-08-09 ENCOUNTER — Ambulatory Visit
Admission: RE | Admit: 2020-08-09 | Discharge: 2020-08-09 | Disposition: A | Discharge status: Home / Self Care | Payer: Medicare Other | Source: Ambulatory Visit | Attending: Physician Assistant | Admitting: Physician Assistant

## 2020-08-09 ENCOUNTER — Other Ambulatory Visit: Payer: Self-pay | Admitting: Physician Assistant

## 2020-08-09 DIAGNOSIS — M545 Low back pain, unspecified: Secondary | ICD-10-CM | POA: Diagnosis not present

## 2020-08-09 DIAGNOSIS — R109 Unspecified abdominal pain: Secondary | ICD-10-CM

## 2020-08-11 DIAGNOSIS — R2689 Other abnormalities of gait and mobility: Secondary | ICD-10-CM | POA: Diagnosis not present

## 2020-08-11 DIAGNOSIS — M25562 Pain in left knee: Secondary | ICD-10-CM | POA: Diagnosis not present

## 2020-08-12 ENCOUNTER — Other Ambulatory Visit: Payer: Self-pay | Admitting: Family Medicine

## 2020-08-12 DIAGNOSIS — M545 Low back pain, unspecified: Secondary | ICD-10-CM

## 2020-08-12 DIAGNOSIS — Z6822 Body mass index (BMI) 22.0-22.9, adult: Secondary | ICD-10-CM | POA: Diagnosis not present

## 2020-08-12 DIAGNOSIS — M1990 Unspecified osteoarthritis, unspecified site: Secondary | ICD-10-CM

## 2020-08-12 DIAGNOSIS — I499 Cardiac arrhythmia, unspecified: Secondary | ICD-10-CM | POA: Diagnosis not present

## 2020-08-12 DIAGNOSIS — R413 Other amnesia: Secondary | ICD-10-CM | POA: Diagnosis not present

## 2020-08-12 DIAGNOSIS — R109 Unspecified abdominal pain: Secondary | ICD-10-CM

## 2020-08-12 DIAGNOSIS — R634 Abnormal weight loss: Secondary | ICD-10-CM | POA: Diagnosis not present

## 2020-09-01 ENCOUNTER — Other Ambulatory Visit: Payer: Self-pay | Admitting: Family Medicine

## 2020-09-01 ENCOUNTER — Other Ambulatory Visit: Payer: Self-pay

## 2020-09-01 ENCOUNTER — Ambulatory Visit
Admission: RE | Admit: 2020-09-01 | Discharge: 2020-09-01 | Disposition: A | Discharge status: Home / Self Care | Payer: Medicare Other | Source: Ambulatory Visit | Attending: Family Medicine | Admitting: Family Medicine

## 2020-09-01 DIAGNOSIS — R634 Abnormal weight loss: Secondary | ICD-10-CM

## 2020-09-01 DIAGNOSIS — M1990 Unspecified osteoarthritis, unspecified site: Secondary | ICD-10-CM

## 2020-09-01 DIAGNOSIS — M545 Low back pain, unspecified: Secondary | ICD-10-CM

## 2020-09-01 DIAGNOSIS — R109 Unspecified abdominal pain: Secondary | ICD-10-CM

## 2020-09-01 DIAGNOSIS — M48061 Spinal stenosis, lumbar region without neurogenic claudication: Secondary | ICD-10-CM | POA: Diagnosis not present

## 2020-09-02 DIAGNOSIS — K5904 Chronic idiopathic constipation: Secondary | ICD-10-CM | POA: Diagnosis not present

## 2020-09-02 DIAGNOSIS — R109 Unspecified abdominal pain: Secondary | ICD-10-CM | POA: Diagnosis not present

## 2020-09-02 DIAGNOSIS — R634 Abnormal weight loss: Secondary | ICD-10-CM | POA: Diagnosis not present

## 2020-09-05 DIAGNOSIS — R2689 Other abnormalities of gait and mobility: Secondary | ICD-10-CM | POA: Diagnosis not present

## 2020-09-05 DIAGNOSIS — M25562 Pain in left knee: Secondary | ICD-10-CM | POA: Diagnosis not present

## 2020-09-07 ENCOUNTER — Other Ambulatory Visit: Payer: Self-pay | Admitting: Family Medicine

## 2020-09-07 DIAGNOSIS — Z1231 Encounter for screening mammogram for malignant neoplasm of breast: Secondary | ICD-10-CM

## 2020-09-07 DIAGNOSIS — Z1389 Encounter for screening for other disorder: Secondary | ICD-10-CM | POA: Diagnosis not present

## 2020-09-07 DIAGNOSIS — Z0000 Encounter for general adult medical examination without abnormal findings: Secondary | ICD-10-CM | POA: Diagnosis not present

## 2020-09-12 IMAGING — DX DG FOOT 2V*L*
2 series · 2 of 2 positions shown · non-contrast
Comparison: None.

CLINICAL DATA: 78-year-old female status post fall. Dizziness. Pain
and swelling.

EXAM:
LEFT FOOT - 2 VIEW

[foot ap]
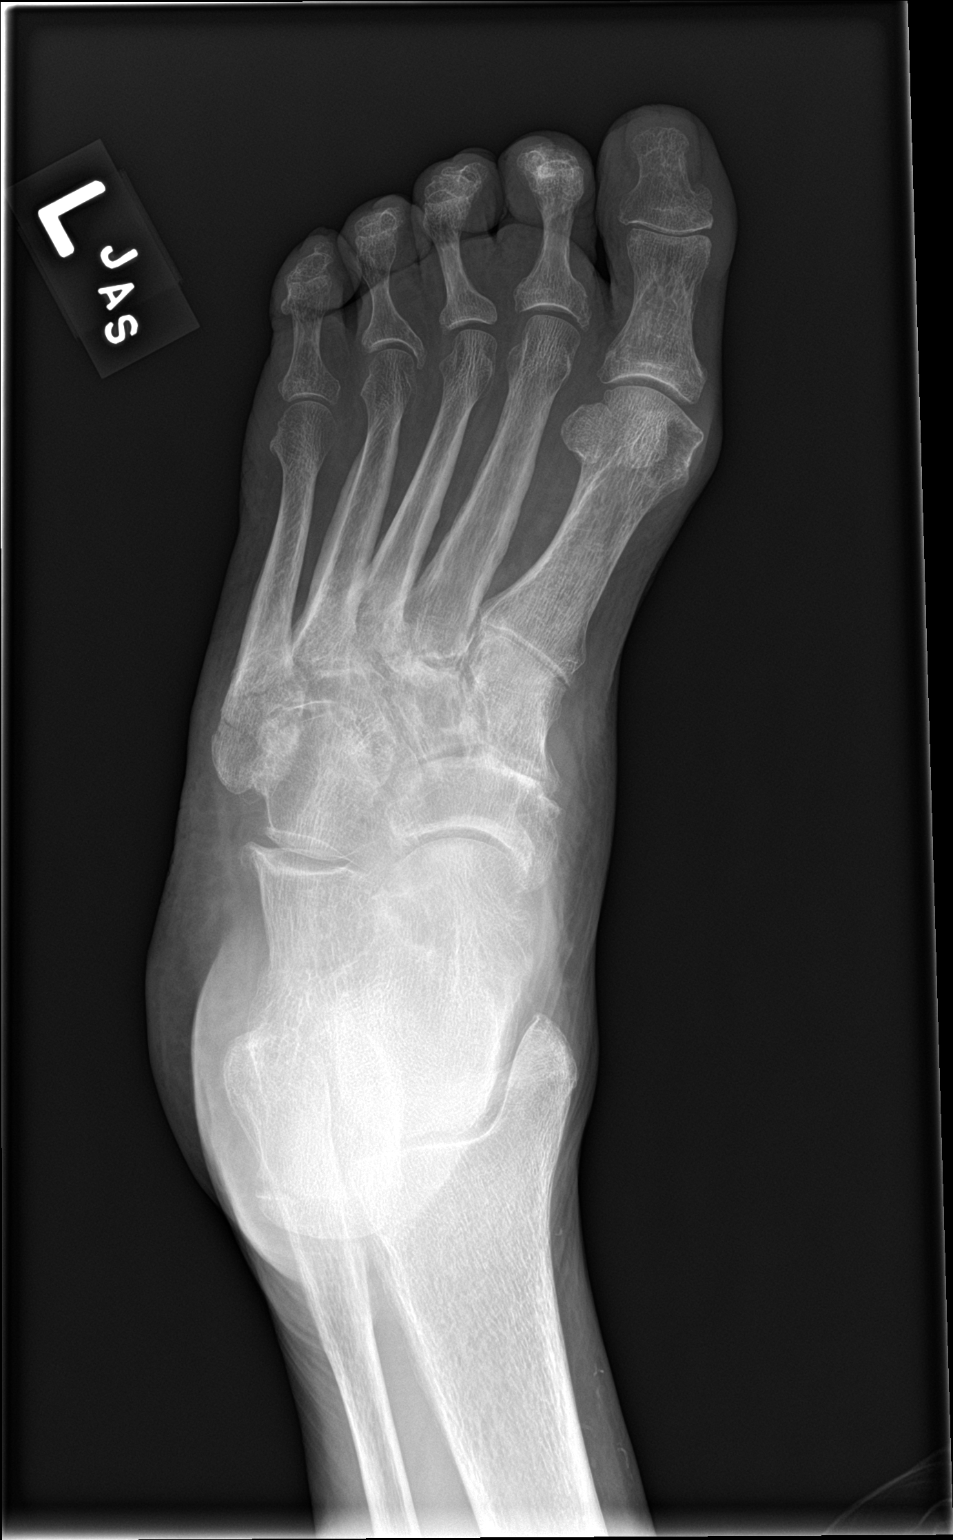

[foot lat]
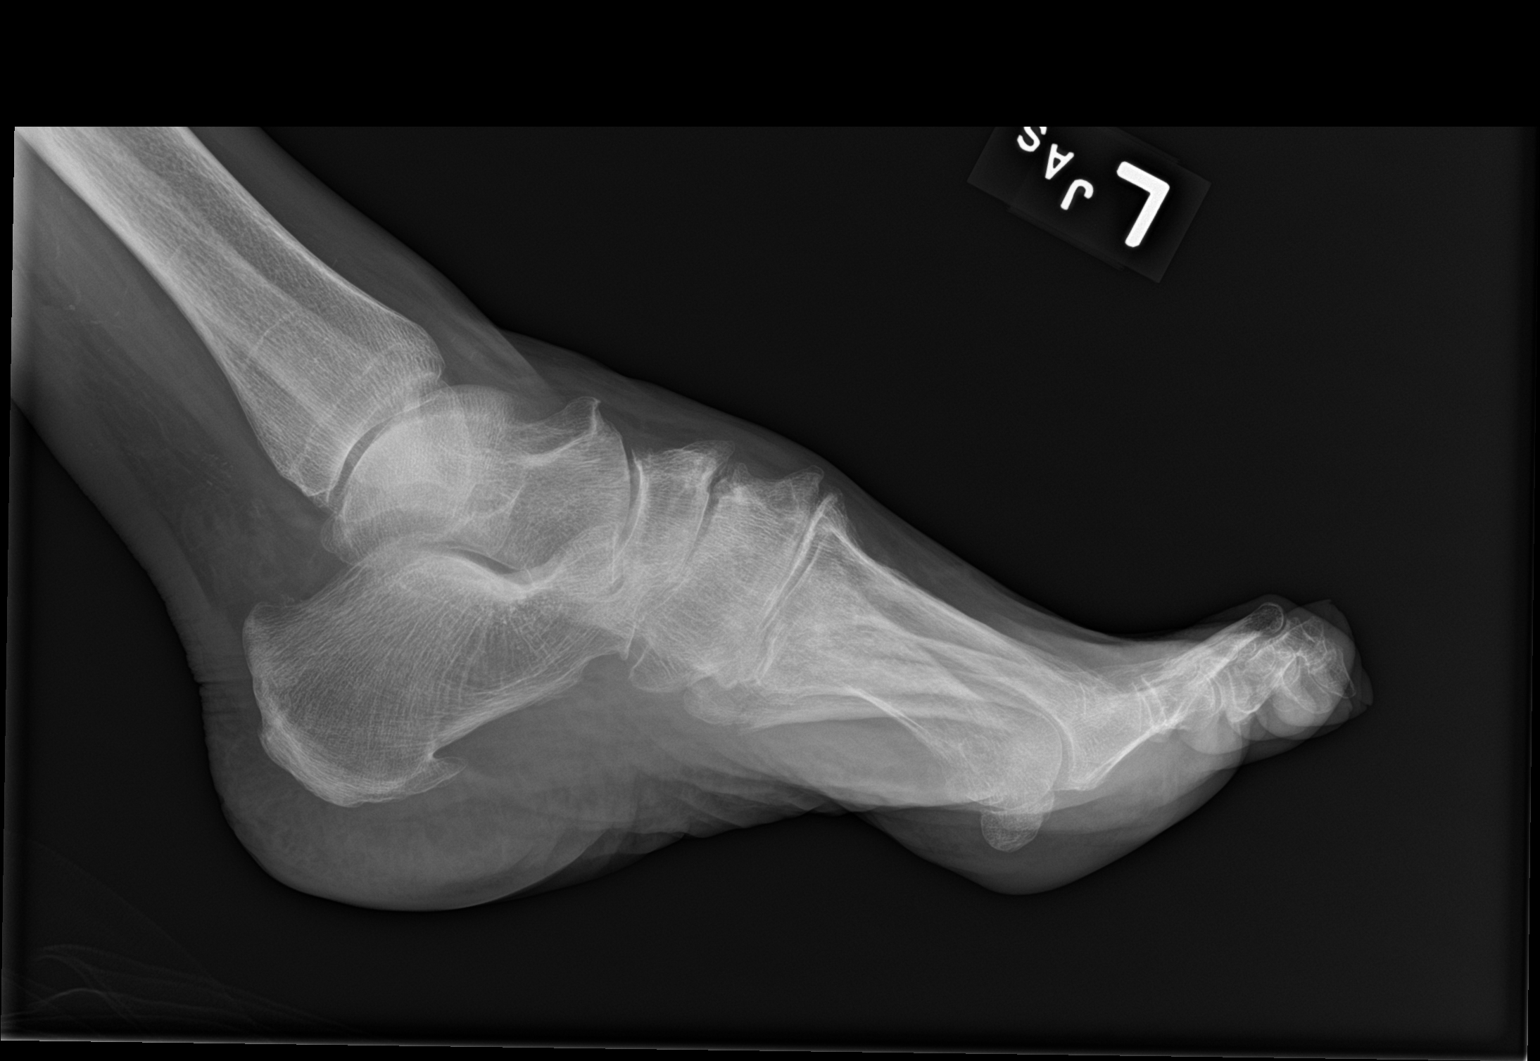

[2 of 2 positions shown; findings below may reference images not displayed]

FINDINGS: Widespread bulky degenerative tarsal bone spurring. Lesser
degenerative spurring of the calcaneus. Mildly comminuted but
nondisplaced acute fracture at the base of the 5th metatarsal. No
other acute fracture identified.
IMPRESSION: Mildly comminuted but nondisplaced fracture at the base of the 5th
metatarsal.

## 2020-09-12 IMAGING — DX DG ANKLE 2V *L*
2 series · 2 of 2 positions shown · non-contrast
Comparison: Left foot series reported separately.

CLINICAL DATA: 78-year-old female status post fall. Dizziness. Pain
and swelling.

EXAM:
LEFT ANKLE - 2 VIEW

[ankle ap]
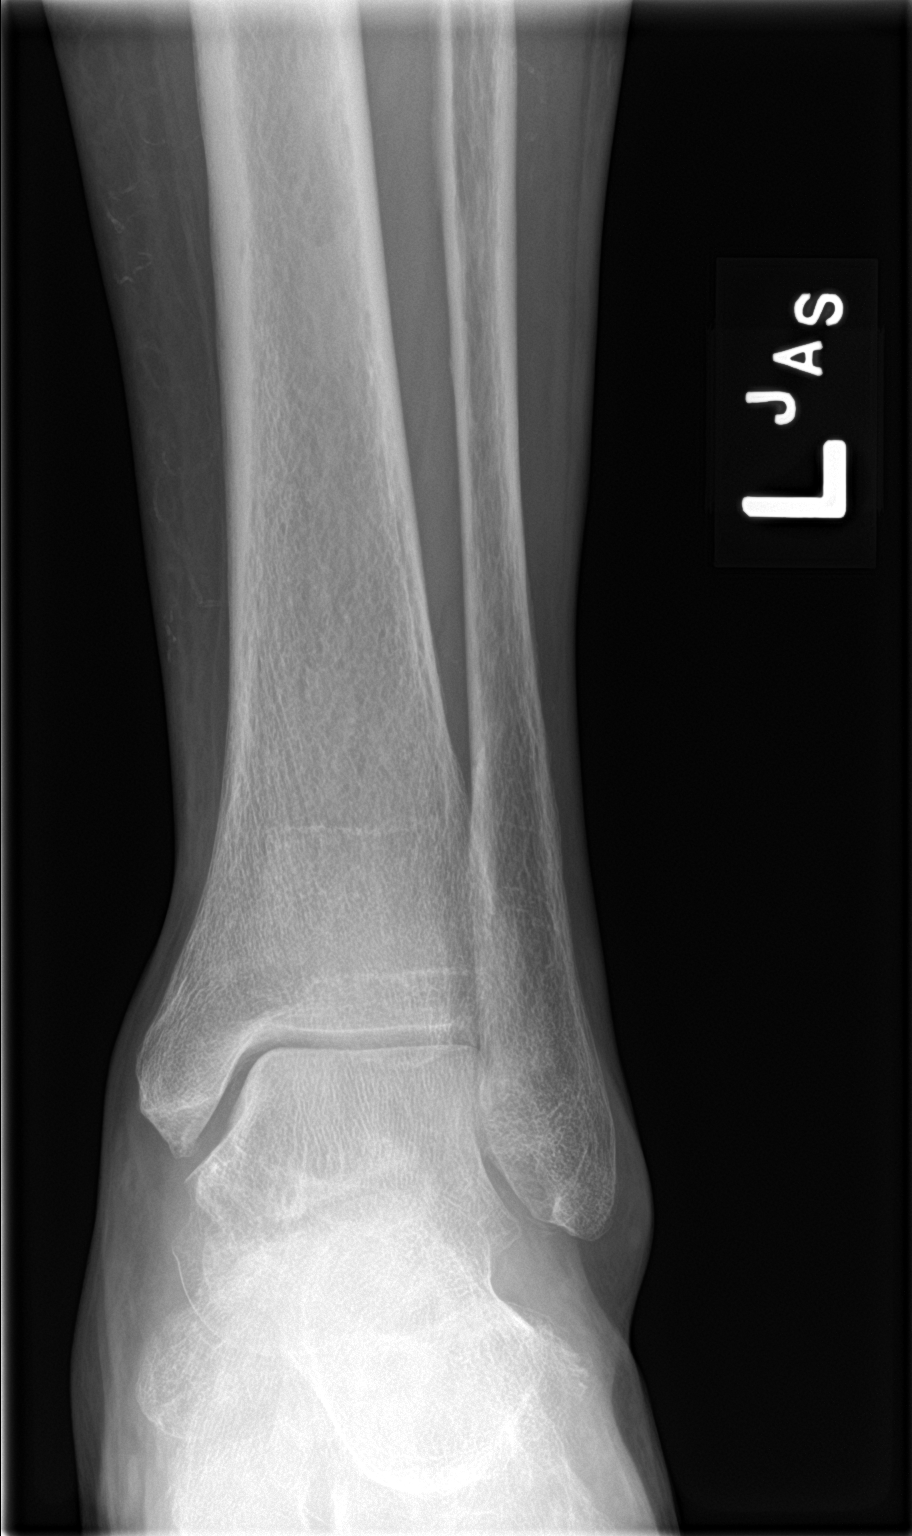

[ankle lat]
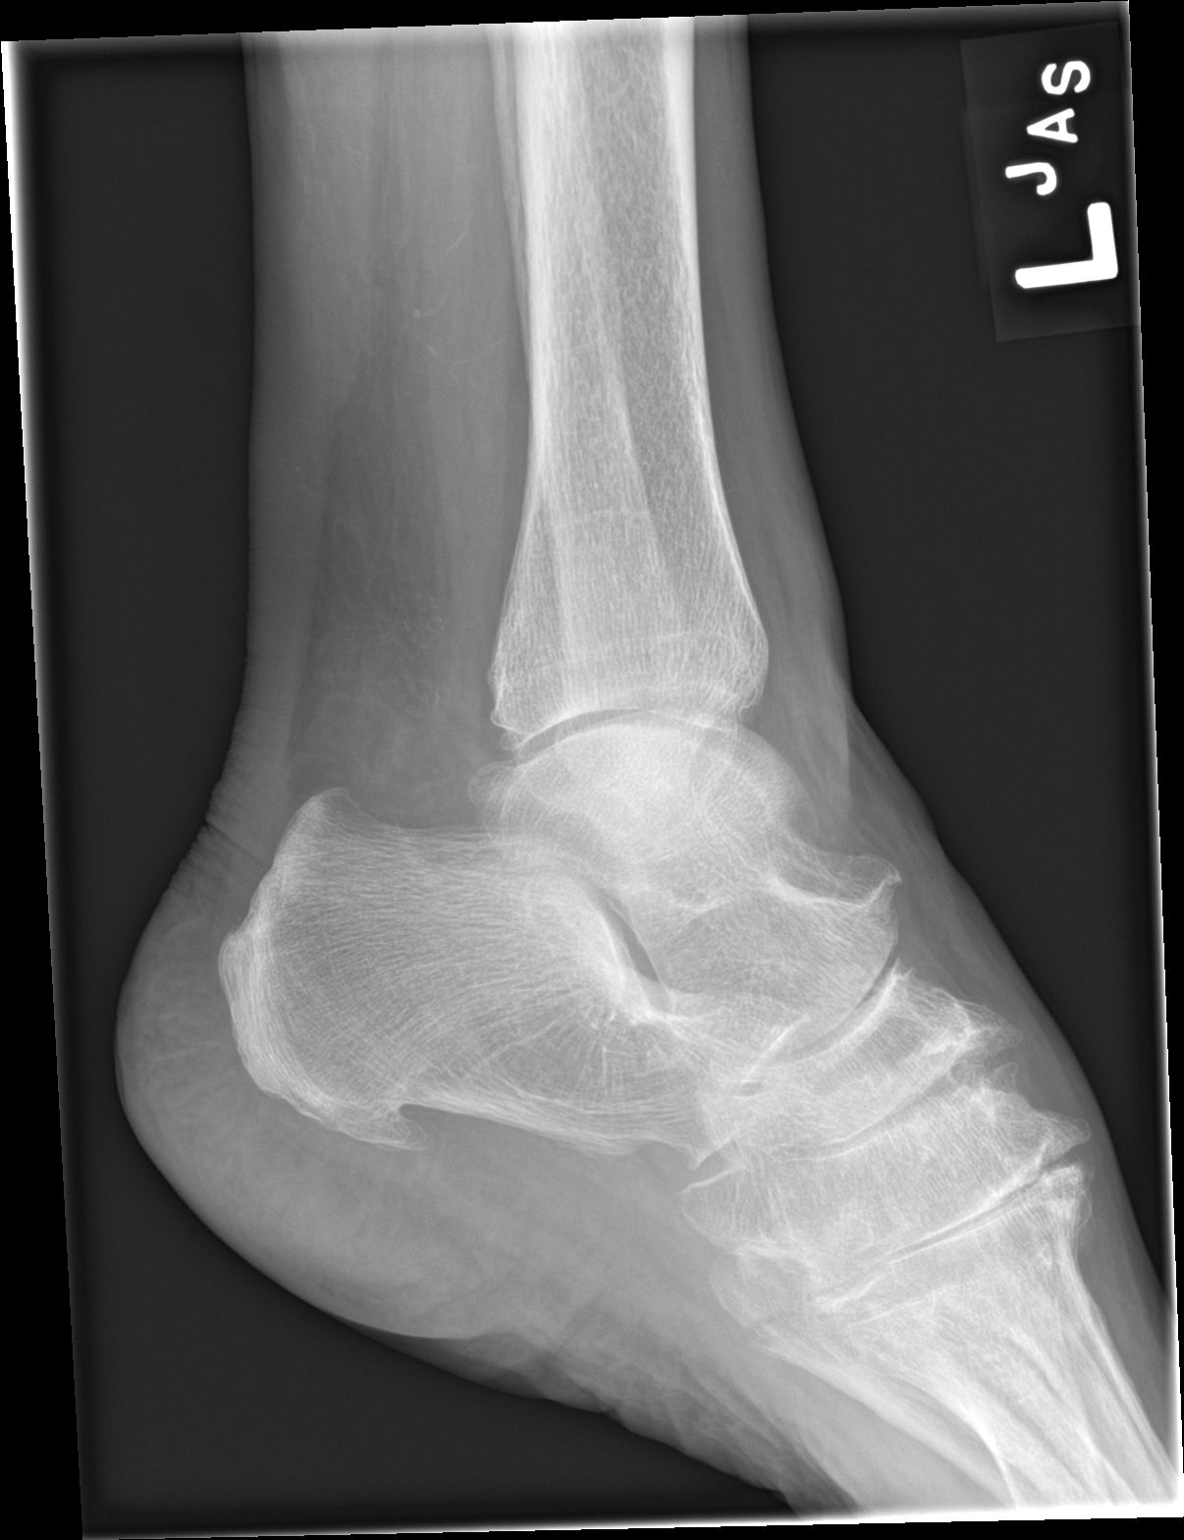

[2 of 2 positions shown; findings below may reference images not displayed]

FINDINGS: Preserved mortise joint alignment. Bone mineralization is within
normal limits. Talar dome intact. No evidence of joint effusion.
Distal tibia, fibula and calcaneus appear intact. Calcaneus and
midfoot degenerative spurring again noted. Fracture at the base of
the left 5th metatarsal redemonstrated. Mild vascular calcifications
in the distal left leg.
IMPRESSION: 1. No acute fracture or dislocation identified about the left ankle.
2. Fracture at the base of the left 5th metatarsal re-demonstrated.

## 2020-09-13 IMAGING — CT CT ABD-PELV W/ CM
2 of 5 series · 17 of 46 positions shown, 19 images · IV contrast (Omni 300)
Comparison: 10/14/2018

CLINICAL DATA: Abdominal pain and rectal bleeding

EXAM:
CT ABDOMEN AND PELVIS WITH CONTRAST
TECHNIQUE: Multidetector CT imaging of the abdomen and pelvis was performed
using the standard protocol following bolus administration of
intravenous contrast.
CONTRAST:  100mL OMNIPAQUE IOHEXOL 300 MG/ML  SOLN

[Series 3: a/p w/ 5mm · axial · 0.98mm/px · z∈[+823,+1273]mm · 14 of 102 slices shown, 16 images]
[im 6/102  soft-tissue]
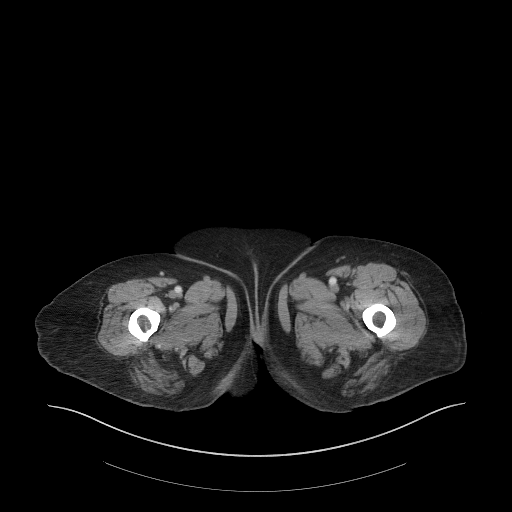
[im 6/102  bone]
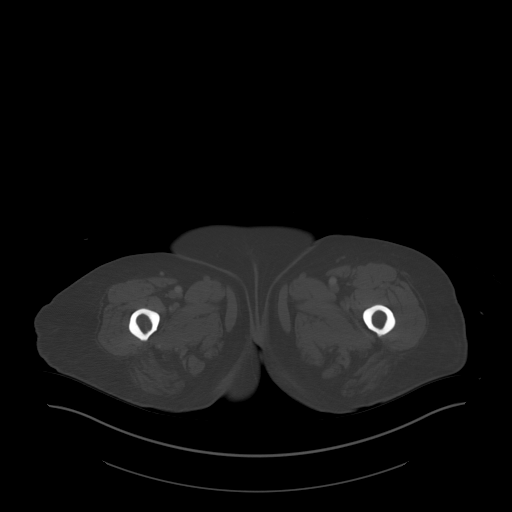
[im 11/102  soft-tissue]
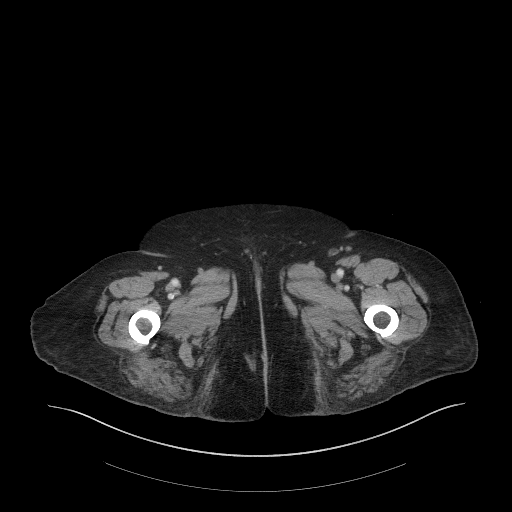
[im 22/102  soft-tissue]
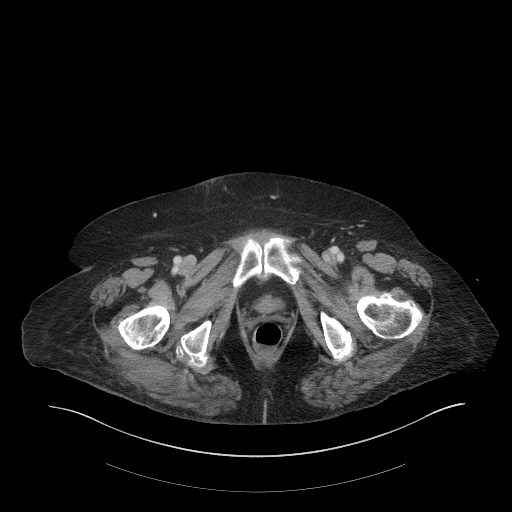
[im 27/102  soft-tissue]
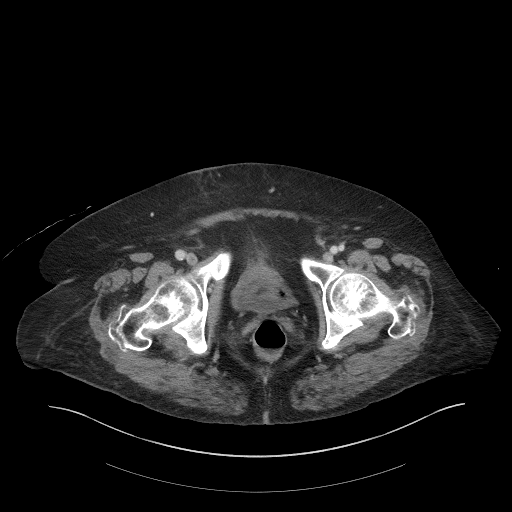
[im 32/102  soft-tissue]
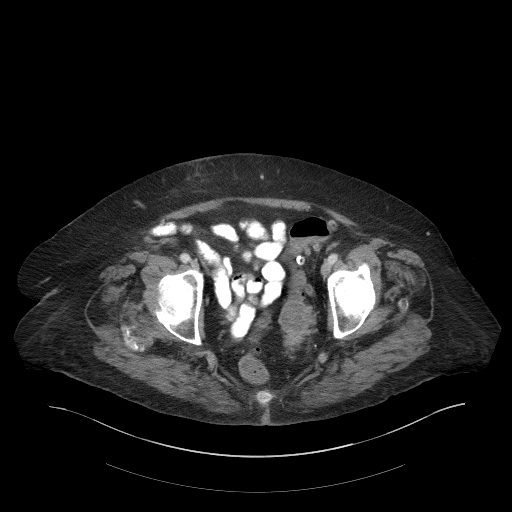
[im 43/102  soft-tissue]
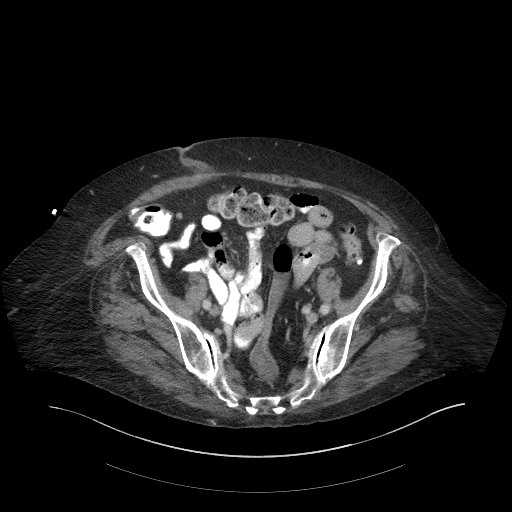
[im 48/102  soft-tissue]
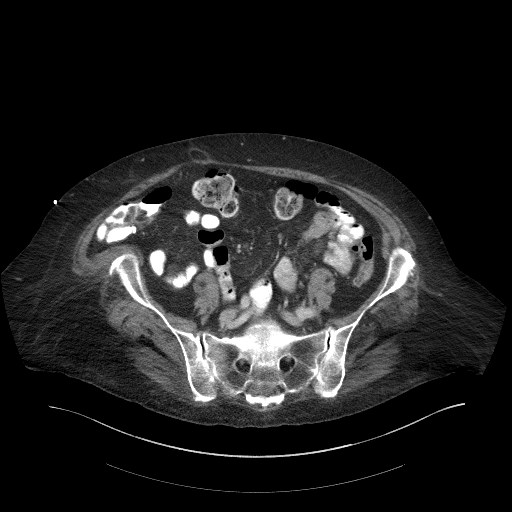
[im 54/102  soft-tissue]
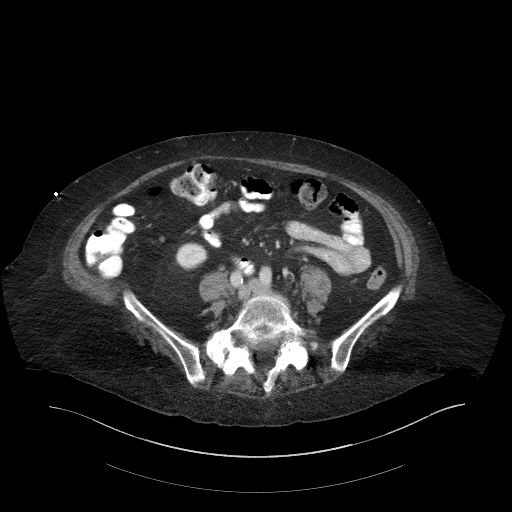
[im 59/102  soft-tissue]
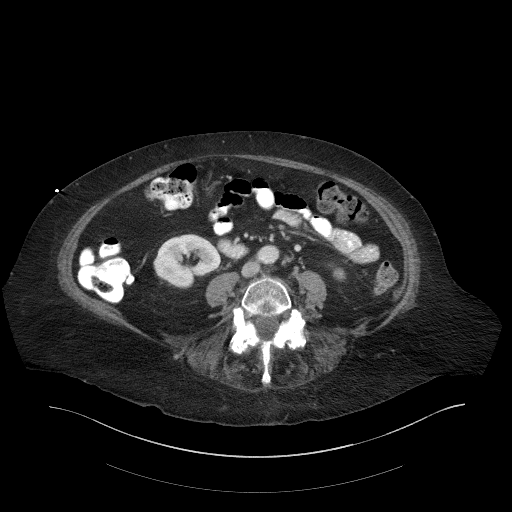
[im 59/102  bone]
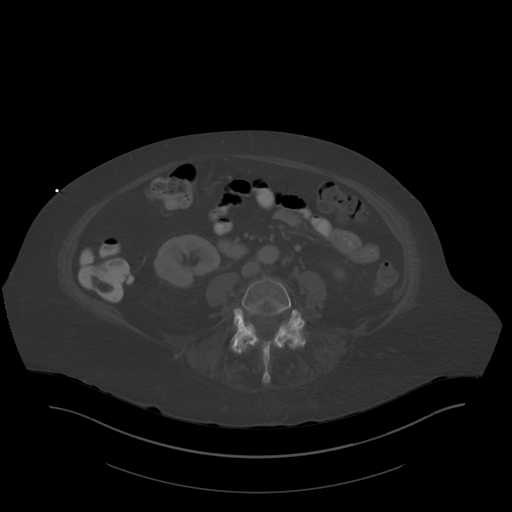
[im 70/102  soft-tissue]
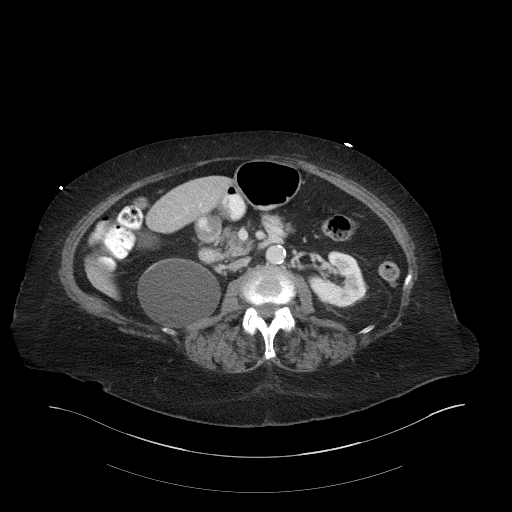
[im 75/102  soft-tissue]
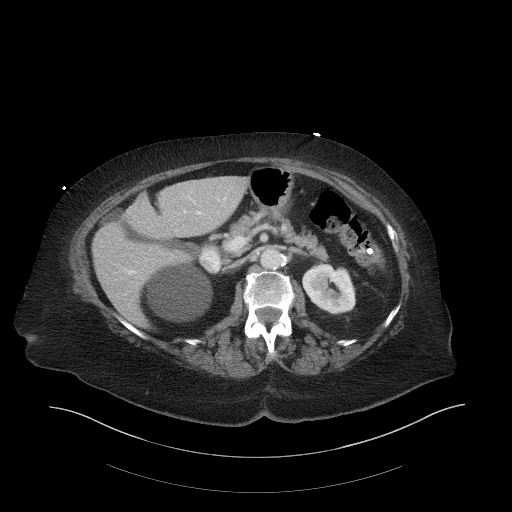
[im 80/102  soft-tissue]
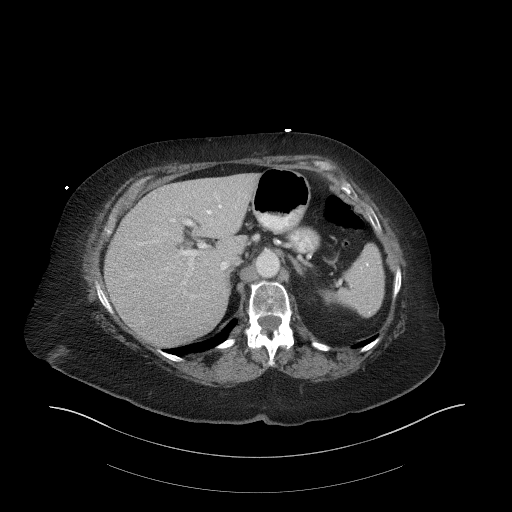
[im 91/102  soft-tissue]
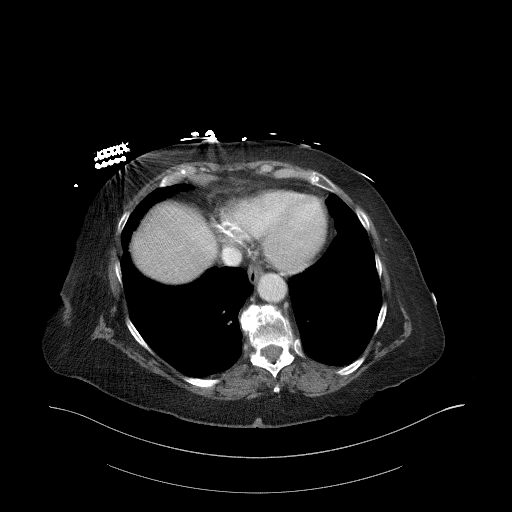
[im 96/102  soft-tissue]
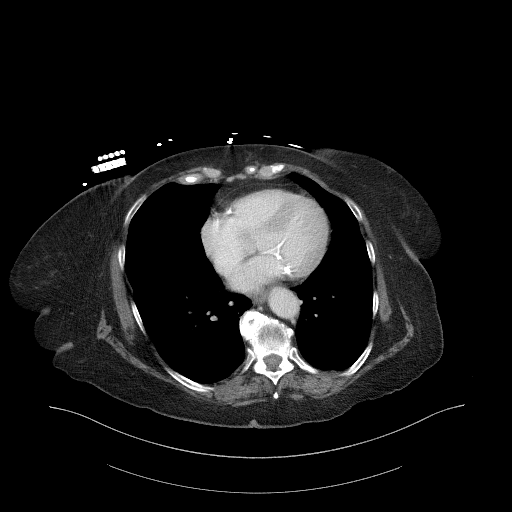

[Series 6: a/p w/ cor · coronal · 0.98mm/px · 3 of 144 slices shown]
[im 48/144  soft-tissue]
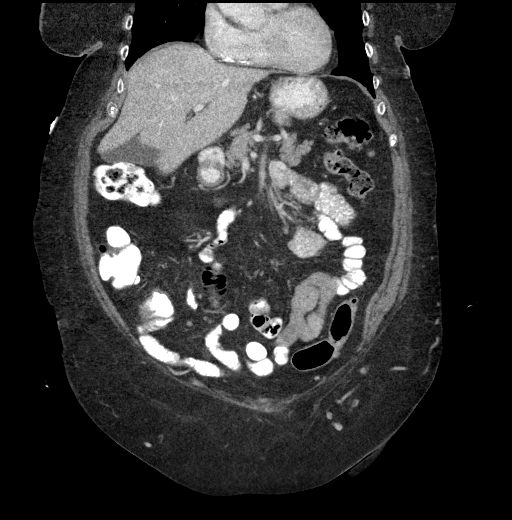
[im 64/144  soft-tissue]
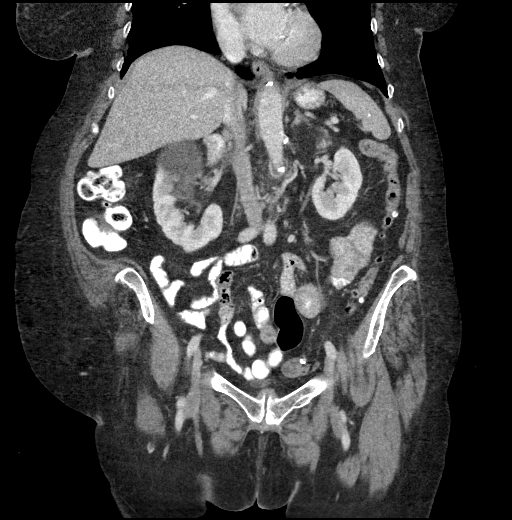
[im 80/144  soft-tissue]
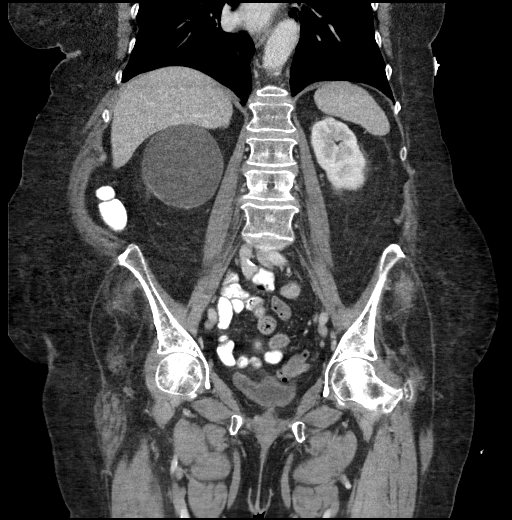

[17 of 46 positions shown; findings below may reference images not displayed]

FINDINGS: Lower chest: No acute abnormality.

Hepatobiliary: Fatty infiltration of the liver is noted. No focal
mass is seen. The gallbladder is well distended without focal
abnormality.

Pancreas: Pancreas is within normal limits.

Spleen: Spleen demonstrates multiple calcified granulomas.

Adrenals/Urinary Tract: Adrenal glands are unremarkable. Left kidney
is within normal limits. Right kidney demonstrates a large cyst in
the upper pole measuring 8 cm in greatest dimension. No obstructive
changes are seen. The bladder is decompressed.

Stomach/Bowel: Diverticular changes noted within the sigmoid colon
without evidence of diverticulitis. Smooth muscle thickening is
noted related to the diverticular change. The appendix is within
normal limits. No small bowel abnormality is seen. The stomach is
unremarkable.

Vascular/Lymphatic: Aortic atherosclerosis. No enlarged abdominal or
pelvic lymph nodes.

Reproductive: Status post hysterectomy. No adnexal masses.

Other: No abdominal wall hernia or abnormality. No abdominopelvic
ascites.

Musculoskeletal: Degenerative changes of the lumbar spine are noted.
No acute abnormality is seen. Stable anterolisthesis of L3 on L4 and
L4 on L5 is seen.
IMPRESSION: Diverticular change with smooth muscle hypertrophy. No findings to
suggest diverticulitis are seen.

Chronic changes as described above.

## 2020-09-16 DIAGNOSIS — H16142 Punctate keratitis, left eye: Secondary | ICD-10-CM | POA: Diagnosis not present

## 2020-09-19 DIAGNOSIS — M8580 Other specified disorders of bone density and structure, unspecified site: Secondary | ICD-10-CM | POA: Diagnosis not present

## 2020-09-19 DIAGNOSIS — M4722 Other spondylosis with radiculopathy, cervical region: Secondary | ICD-10-CM | POA: Diagnosis not present

## 2020-09-19 DIAGNOSIS — G8929 Other chronic pain: Secondary | ICD-10-CM | POA: Diagnosis not present

## 2020-09-19 DIAGNOSIS — K219 Gastro-esophageal reflux disease without esophagitis: Secondary | ICD-10-CM | POA: Diagnosis not present

## 2020-09-19 DIAGNOSIS — M1990 Unspecified osteoarthritis, unspecified site: Secondary | ICD-10-CM | POA: Diagnosis not present

## 2020-09-19 DIAGNOSIS — D508 Other iron deficiency anemias: Secondary | ICD-10-CM | POA: Diagnosis not present

## 2020-09-19 DIAGNOSIS — E039 Hypothyroidism, unspecified: Secondary | ICD-10-CM | POA: Diagnosis not present

## 2020-09-19 DIAGNOSIS — R413 Other amnesia: Secondary | ICD-10-CM | POA: Diagnosis not present

## 2020-09-19 DIAGNOSIS — E1140 Type 2 diabetes mellitus with diabetic neuropathy, unspecified: Secondary | ICD-10-CM | POA: Diagnosis not present

## 2020-09-19 DIAGNOSIS — Z6822 Body mass index (BMI) 22.0-22.9, adult: Secondary | ICD-10-CM | POA: Diagnosis not present

## 2020-09-19 DIAGNOSIS — E7800 Pure hypercholesterolemia, unspecified: Secondary | ICD-10-CM | POA: Diagnosis not present

## 2020-09-19 DIAGNOSIS — I10 Essential (primary) hypertension: Secondary | ICD-10-CM | POA: Diagnosis not present

## 2020-09-22 DIAGNOSIS — Z96652 Presence of left artificial knee joint: Secondary | ICD-10-CM | POA: Diagnosis not present

## 2020-10-10 DIAGNOSIS — M47816 Spondylosis without myelopathy or radiculopathy, lumbar region: Secondary | ICD-10-CM | POA: Diagnosis not present

## 2020-10-21 DIAGNOSIS — E039 Hypothyroidism, unspecified: Secondary | ICD-10-CM | POA: Diagnosis not present

## 2020-10-21 DIAGNOSIS — M8580 Other specified disorders of bone density and structure, unspecified site: Secondary | ICD-10-CM | POA: Diagnosis not present

## 2020-10-21 DIAGNOSIS — G8929 Other chronic pain: Secondary | ICD-10-CM | POA: Diagnosis not present

## 2020-10-21 DIAGNOSIS — K219 Gastro-esophageal reflux disease without esophagitis: Secondary | ICD-10-CM | POA: Diagnosis not present

## 2020-10-21 DIAGNOSIS — M1712 Unilateral primary osteoarthritis, left knee: Secondary | ICD-10-CM | POA: Diagnosis not present

## 2020-10-21 DIAGNOSIS — E1140 Type 2 diabetes mellitus with diabetic neuropathy, unspecified: Secondary | ICD-10-CM | POA: Diagnosis not present

## 2020-10-21 DIAGNOSIS — M4722 Other spondylosis with radiculopathy, cervical region: Secondary | ICD-10-CM | POA: Diagnosis not present

## 2020-10-21 DIAGNOSIS — I10 Essential (primary) hypertension: Secondary | ICD-10-CM | POA: Diagnosis not present

## 2020-10-21 DIAGNOSIS — D508 Other iron deficiency anemias: Secondary | ICD-10-CM | POA: Diagnosis not present

## 2020-10-21 DIAGNOSIS — M1990 Unspecified osteoarthritis, unspecified site: Secondary | ICD-10-CM | POA: Diagnosis not present

## 2020-10-21 DIAGNOSIS — E7800 Pure hypercholesterolemia, unspecified: Secondary | ICD-10-CM | POA: Diagnosis not present

## 2020-11-04 DIAGNOSIS — K219 Gastro-esophageal reflux disease without esophagitis: Secondary | ICD-10-CM | POA: Diagnosis not present

## 2020-11-04 DIAGNOSIS — K5904 Chronic idiopathic constipation: Secondary | ICD-10-CM | POA: Diagnosis not present

## 2020-11-04 DIAGNOSIS — R634 Abnormal weight loss: Secondary | ICD-10-CM | POA: Diagnosis not present

## 2020-11-04 DIAGNOSIS — R1084 Generalized abdominal pain: Secondary | ICD-10-CM | POA: Diagnosis not present

## 2020-11-11 DIAGNOSIS — H16142 Punctate keratitis, left eye: Secondary | ICD-10-CM | POA: Diagnosis not present

## 2020-11-11 DIAGNOSIS — H52223 Regular astigmatism, bilateral: Secondary | ICD-10-CM | POA: Diagnosis not present

## 2020-11-11 DIAGNOSIS — H5213 Myopia, bilateral: Secondary | ICD-10-CM | POA: Diagnosis not present

## 2020-11-11 DIAGNOSIS — H524 Presbyopia: Secondary | ICD-10-CM | POA: Diagnosis not present

## 2020-11-11 DIAGNOSIS — H11122 Conjunctival concretions, left eye: Secondary | ICD-10-CM | POA: Diagnosis not present

## 2020-11-15 DIAGNOSIS — M47816 Spondylosis without myelopathy or radiculopathy, lumbar region: Secondary | ICD-10-CM | POA: Diagnosis not present

## 2020-12-05 ENCOUNTER — Ambulatory Visit: Payer: Medicare Other

## 2020-12-11 DIAGNOSIS — G8929 Other chronic pain: Secondary | ICD-10-CM | POA: Diagnosis not present

## 2020-12-11 DIAGNOSIS — K219 Gastro-esophageal reflux disease without esophagitis: Secondary | ICD-10-CM | POA: Diagnosis not present

## 2020-12-11 DIAGNOSIS — E039 Hypothyroidism, unspecified: Secondary | ICD-10-CM | POA: Diagnosis not present

## 2020-12-11 DIAGNOSIS — I10 Essential (primary) hypertension: Secondary | ICD-10-CM | POA: Diagnosis not present

## 2020-12-11 DIAGNOSIS — E7800 Pure hypercholesterolemia, unspecified: Secondary | ICD-10-CM | POA: Diagnosis not present

## 2020-12-11 DIAGNOSIS — M8580 Other specified disorders of bone density and structure, unspecified site: Secondary | ICD-10-CM | POA: Diagnosis not present

## 2020-12-11 DIAGNOSIS — E1140 Type 2 diabetes mellitus with diabetic neuropathy, unspecified: Secondary | ICD-10-CM | POA: Diagnosis not present

## 2020-12-31 ENCOUNTER — Ambulatory Visit
Admission: RE | Admit: 2020-12-31 | Discharge: 2020-12-31 | Disposition: A | Discharge status: Home / Self Care | Payer: Medicare Other | Source: Ambulatory Visit | Attending: Family Medicine | Admitting: Family Medicine

## 2020-12-31 DIAGNOSIS — Z1231 Encounter for screening mammogram for malignant neoplasm of breast: Secondary | ICD-10-CM

## 2021-01-12 DIAGNOSIS — Z6822 Body mass index (BMI) 22.0-22.9, adult: Secondary | ICD-10-CM | POA: Diagnosis not present

## 2021-01-12 DIAGNOSIS — L900 Lichen sclerosus et atrophicus: Secondary | ICD-10-CM | POA: Diagnosis not present

## 2021-01-12 DIAGNOSIS — Z1272 Encounter for screening for malignant neoplasm of vagina: Secondary | ICD-10-CM | POA: Diagnosis not present

## 2021-01-12 DIAGNOSIS — Z90711 Acquired absence of uterus with remaining cervical stump: Secondary | ICD-10-CM | POA: Diagnosis not present

## 2021-01-12 DIAGNOSIS — M8588 Other specified disorders of bone density and structure, other site: Secondary | ICD-10-CM | POA: Diagnosis not present

## 2021-01-12 DIAGNOSIS — Z124 Encounter for screening for malignant neoplasm of cervix: Secondary | ICD-10-CM | POA: Diagnosis not present

## 2021-01-12 DIAGNOSIS — N958 Other specified menopausal and perimenopausal disorders: Secondary | ICD-10-CM | POA: Diagnosis not present

## 2021-01-13 DIAGNOSIS — E1169 Type 2 diabetes mellitus with other specified complication: Secondary | ICD-10-CM | POA: Diagnosis not present

## 2021-01-13 DIAGNOSIS — E7800 Pure hypercholesterolemia, unspecified: Secondary | ICD-10-CM | POA: Diagnosis not present

## 2021-01-13 DIAGNOSIS — E039 Hypothyroidism, unspecified: Secondary | ICD-10-CM | POA: Diagnosis not present

## 2021-01-13 DIAGNOSIS — R413 Other amnesia: Secondary | ICD-10-CM | POA: Diagnosis not present

## 2021-01-13 DIAGNOSIS — I10 Essential (primary) hypertension: Secondary | ICD-10-CM | POA: Diagnosis not present

## 2021-01-31 DIAGNOSIS — M47816 Spondylosis without myelopathy or radiculopathy, lumbar region: Secondary | ICD-10-CM | POA: Diagnosis not present

## 2021-01-31 DIAGNOSIS — G8929 Other chronic pain: Secondary | ICD-10-CM | POA: Diagnosis not present

## 2021-01-31 DIAGNOSIS — M4722 Other spondylosis with radiculopathy, cervical region: Secondary | ICD-10-CM | POA: Diagnosis not present

## 2021-01-31 DIAGNOSIS — M1712 Unilateral primary osteoarthritis, left knee: Secondary | ICD-10-CM | POA: Diagnosis not present

## 2021-01-31 DIAGNOSIS — E1140 Type 2 diabetes mellitus with diabetic neuropathy, unspecified: Secondary | ICD-10-CM | POA: Diagnosis not present

## 2021-01-31 DIAGNOSIS — E7800 Pure hypercholesterolemia, unspecified: Secondary | ICD-10-CM | POA: Diagnosis not present

## 2021-01-31 DIAGNOSIS — K219 Gastro-esophageal reflux disease without esophagitis: Secondary | ICD-10-CM | POA: Diagnosis not present

## 2021-01-31 DIAGNOSIS — D508 Other iron deficiency anemias: Secondary | ICD-10-CM | POA: Diagnosis not present

## 2021-01-31 DIAGNOSIS — E039 Hypothyroidism, unspecified: Secondary | ICD-10-CM | POA: Diagnosis not present

## 2021-01-31 DIAGNOSIS — I10 Essential (primary) hypertension: Secondary | ICD-10-CM | POA: Diagnosis not present

## 2021-01-31 DIAGNOSIS — E1169 Type 2 diabetes mellitus with other specified complication: Secondary | ICD-10-CM | POA: Diagnosis not present

## 2021-01-31 DIAGNOSIS — M1990 Unspecified osteoarthritis, unspecified site: Secondary | ICD-10-CM | POA: Diagnosis not present

## 2021-02-07 DIAGNOSIS — F03B0 Unspecified dementia, moderate, without behavioral disturbance, psychotic disturbance, mood disturbance, and anxiety: Secondary | ICD-10-CM | POA: Diagnosis not present

## 2021-02-28 ENCOUNTER — Ambulatory Visit: Payer: Medicare Other

## 2021-03-02 ENCOUNTER — Ambulatory Visit
Admission: RE | Admit: 2021-03-02 | Discharge: 2021-03-02 | Disposition: A | Discharge status: Home / Self Care | Payer: Medicare Other | Source: Ambulatory Visit | Attending: Family Medicine | Admitting: Family Medicine

## 2021-03-02 DIAGNOSIS — Z1231 Encounter for screening mammogram for malignant neoplasm of breast: Secondary | ICD-10-CM

## 2021-03-07 DIAGNOSIS — D508 Other iron deficiency anemias: Secondary | ICD-10-CM | POA: Diagnosis not present

## 2021-03-07 DIAGNOSIS — E039 Hypothyroidism, unspecified: Secondary | ICD-10-CM | POA: Diagnosis not present

## 2021-03-07 DIAGNOSIS — K219 Gastro-esophageal reflux disease without esophagitis: Secondary | ICD-10-CM | POA: Diagnosis not present

## 2021-03-07 DIAGNOSIS — E1140 Type 2 diabetes mellitus with diabetic neuropathy, unspecified: Secondary | ICD-10-CM | POA: Diagnosis not present

## 2021-03-07 DIAGNOSIS — M47816 Spondylosis without myelopathy or radiculopathy, lumbar region: Secondary | ICD-10-CM | POA: Diagnosis not present

## 2021-03-07 DIAGNOSIS — M8580 Other specified disorders of bone density and structure, unspecified site: Secondary | ICD-10-CM | POA: Diagnosis not present

## 2021-03-07 DIAGNOSIS — M4722 Other spondylosis with radiculopathy, cervical region: Secondary | ICD-10-CM | POA: Diagnosis not present

## 2021-03-07 DIAGNOSIS — E1169 Type 2 diabetes mellitus with other specified complication: Secondary | ICD-10-CM | POA: Diagnosis not present

## 2021-03-07 DIAGNOSIS — E7800 Pure hypercholesterolemia, unspecified: Secondary | ICD-10-CM | POA: Diagnosis not present

## 2021-03-07 DIAGNOSIS — M1712 Unilateral primary osteoarthritis, left knee: Secondary | ICD-10-CM | POA: Diagnosis not present

## 2021-03-07 DIAGNOSIS — G8929 Other chronic pain: Secondary | ICD-10-CM | POA: Diagnosis not present

## 2021-03-07 DIAGNOSIS — I10 Essential (primary) hypertension: Secondary | ICD-10-CM | POA: Diagnosis not present

## 2021-03-28 DIAGNOSIS — F03B0 Unspecified dementia, moderate, without behavioral disturbance, psychotic disturbance, mood disturbance, and anxiety: Secondary | ICD-10-CM | POA: Diagnosis not present

## 2021-04-14 DIAGNOSIS — Z03818 Encounter for observation for suspected exposure to other biological agents ruled out: Secondary | ICD-10-CM | POA: Diagnosis not present

## 2021-04-14 DIAGNOSIS — B349 Viral infection, unspecified: Secondary | ICD-10-CM | POA: Diagnosis not present

## 2021-04-21 DIAGNOSIS — H938X3 Other specified disorders of ear, bilateral: Secondary | ICD-10-CM | POA: Diagnosis not present

## 2021-04-21 DIAGNOSIS — J029 Acute pharyngitis, unspecified: Secondary | ICD-10-CM | POA: Diagnosis not present

## 2021-04-21 DIAGNOSIS — R0981 Nasal congestion: Secondary | ICD-10-CM | POA: Diagnosis not present

## 2021-05-03 DIAGNOSIS — K5904 Chronic idiopathic constipation: Secondary | ICD-10-CM | POA: Diagnosis not present

## 2021-05-03 DIAGNOSIS — G8929 Other chronic pain: Secondary | ICD-10-CM | POA: Diagnosis not present

## 2021-05-03 DIAGNOSIS — R109 Unspecified abdominal pain: Secondary | ICD-10-CM | POA: Diagnosis not present

## 2021-05-17 DIAGNOSIS — R0789 Other chest pain: Secondary | ICD-10-CM | POA: Diagnosis not present

## 2021-06-05 DIAGNOSIS — Z20822 Contact with and (suspected) exposure to covid-19: Secondary | ICD-10-CM | POA: Diagnosis not present

## 2021-06-06 DIAGNOSIS — E039 Hypothyroidism, unspecified: Secondary | ICD-10-CM | POA: Diagnosis not present

## 2021-06-06 DIAGNOSIS — G8929 Other chronic pain: Secondary | ICD-10-CM | POA: Diagnosis not present

## 2021-06-06 DIAGNOSIS — E7800 Pure hypercholesterolemia, unspecified: Secondary | ICD-10-CM | POA: Diagnosis not present

## 2021-06-06 DIAGNOSIS — E1140 Type 2 diabetes mellitus with diabetic neuropathy, unspecified: Secondary | ICD-10-CM | POA: Diagnosis not present

## 2021-06-06 DIAGNOSIS — I10 Essential (primary) hypertension: Secondary | ICD-10-CM | POA: Diagnosis not present

## 2021-06-06 DIAGNOSIS — M1712 Unilateral primary osteoarthritis, left knee: Secondary | ICD-10-CM | POA: Diagnosis not present

## 2021-06-28 DIAGNOSIS — R2689 Other abnormalities of gait and mobility: Secondary | ICD-10-CM | POA: Diagnosis not present

## 2021-06-28 DIAGNOSIS — R269 Unspecified abnormalities of gait and mobility: Secondary | ICD-10-CM | POA: Diagnosis not present

## 2021-06-28 DIAGNOSIS — R41841 Cognitive communication deficit: Secondary | ICD-10-CM | POA: Diagnosis not present

## 2021-06-28 DIAGNOSIS — M6281 Muscle weakness (generalized): Secondary | ICD-10-CM | POA: Diagnosis not present

## 2021-06-30 DIAGNOSIS — R41841 Cognitive communication deficit: Secondary | ICD-10-CM | POA: Diagnosis not present

## 2021-06-30 DIAGNOSIS — R269 Unspecified abnormalities of gait and mobility: Secondary | ICD-10-CM | POA: Diagnosis not present

## 2021-06-30 DIAGNOSIS — M6281 Muscle weakness (generalized): Secondary | ICD-10-CM | POA: Diagnosis not present

## 2021-06-30 DIAGNOSIS — R2689 Other abnormalities of gait and mobility: Secondary | ICD-10-CM | POA: Diagnosis not present

## 2021-07-03 DIAGNOSIS — R2689 Other abnormalities of gait and mobility: Secondary | ICD-10-CM | POA: Diagnosis not present

## 2021-07-03 DIAGNOSIS — M6281 Muscle weakness (generalized): Secondary | ICD-10-CM | POA: Diagnosis not present

## 2021-07-03 DIAGNOSIS — R41841 Cognitive communication deficit: Secondary | ICD-10-CM | POA: Diagnosis not present

## 2021-07-03 DIAGNOSIS — R269 Unspecified abnormalities of gait and mobility: Secondary | ICD-10-CM | POA: Diagnosis not present

## 2021-07-04 DIAGNOSIS — M47896 Other spondylosis, lumbar region: Secondary | ICD-10-CM | POA: Diagnosis not present

## 2021-07-04 DIAGNOSIS — D1801 Hemangioma of skin and subcutaneous tissue: Secondary | ICD-10-CM | POA: Diagnosis not present

## 2021-07-04 DIAGNOSIS — G8929 Other chronic pain: Secondary | ICD-10-CM | POA: Diagnosis not present

## 2021-07-04 DIAGNOSIS — D229 Melanocytic nevi, unspecified: Secondary | ICD-10-CM | POA: Diagnosis not present

## 2021-07-04 DIAGNOSIS — L814 Other melanin hyperpigmentation: Secondary | ICD-10-CM | POA: Diagnosis not present

## 2021-07-04 DIAGNOSIS — L821 Other seborrheic keratosis: Secondary | ICD-10-CM | POA: Diagnosis not present

## 2021-07-04 DIAGNOSIS — E7800 Pure hypercholesterolemia, unspecified: Secondary | ICD-10-CM | POA: Diagnosis not present

## 2021-07-04 DIAGNOSIS — I10 Essential (primary) hypertension: Secondary | ICD-10-CM | POA: Diagnosis not present

## 2021-07-04 DIAGNOSIS — L578 Other skin changes due to chronic exposure to nonionizing radiation: Secondary | ICD-10-CM | POA: Diagnosis not present

## 2021-07-04 DIAGNOSIS — K219 Gastro-esophageal reflux disease without esophagitis: Secondary | ICD-10-CM | POA: Diagnosis not present

## 2021-07-04 DIAGNOSIS — E1140 Type 2 diabetes mellitus with diabetic neuropathy, unspecified: Secondary | ICD-10-CM | POA: Diagnosis not present

## 2021-07-04 DIAGNOSIS — K5900 Constipation, unspecified: Secondary | ICD-10-CM | POA: Diagnosis not present

## 2021-07-05 DIAGNOSIS — Z9109 Other allergy status, other than to drugs and biological substances: Secondary | ICD-10-CM | POA: Diagnosis not present

## 2021-07-05 DIAGNOSIS — R2689 Other abnormalities of gait and mobility: Secondary | ICD-10-CM | POA: Diagnosis not present

## 2021-07-05 DIAGNOSIS — R41841 Cognitive communication deficit: Secondary | ICD-10-CM | POA: Diagnosis not present

## 2021-07-05 DIAGNOSIS — R269 Unspecified abnormalities of gait and mobility: Secondary | ICD-10-CM | POA: Diagnosis not present

## 2021-07-05 DIAGNOSIS — M6281 Muscle weakness (generalized): Secondary | ICD-10-CM | POA: Diagnosis not present

## 2021-07-07 DIAGNOSIS — R269 Unspecified abnormalities of gait and mobility: Secondary | ICD-10-CM | POA: Diagnosis not present

## 2021-07-07 DIAGNOSIS — M6281 Muscle weakness (generalized): Secondary | ICD-10-CM | POA: Diagnosis not present

## 2021-07-07 DIAGNOSIS — R2689 Other abnormalities of gait and mobility: Secondary | ICD-10-CM | POA: Diagnosis not present

## 2021-07-07 DIAGNOSIS — R41841 Cognitive communication deficit: Secondary | ICD-10-CM | POA: Diagnosis not present

## 2021-07-10 DIAGNOSIS — R269 Unspecified abnormalities of gait and mobility: Secondary | ICD-10-CM | POA: Diagnosis not present

## 2021-07-10 DIAGNOSIS — M6281 Muscle weakness (generalized): Secondary | ICD-10-CM | POA: Diagnosis not present

## 2021-07-10 DIAGNOSIS — R41841 Cognitive communication deficit: Secondary | ICD-10-CM | POA: Diagnosis not present

## 2021-07-10 DIAGNOSIS — R2689 Other abnormalities of gait and mobility: Secondary | ICD-10-CM | POA: Diagnosis not present

## 2021-07-11 DIAGNOSIS — M6281 Muscle weakness (generalized): Secondary | ICD-10-CM | POA: Diagnosis not present

## 2021-07-11 DIAGNOSIS — R2689 Other abnormalities of gait and mobility: Secondary | ICD-10-CM | POA: Diagnosis not present

## 2021-07-11 DIAGNOSIS — R269 Unspecified abnormalities of gait and mobility: Secondary | ICD-10-CM | POA: Diagnosis not present

## 2021-07-11 DIAGNOSIS — R41841 Cognitive communication deficit: Secondary | ICD-10-CM | POA: Diagnosis not present

## 2021-07-12 DIAGNOSIS — M6281 Muscle weakness (generalized): Secondary | ICD-10-CM | POA: Diagnosis not present

## 2021-07-12 DIAGNOSIS — R41841 Cognitive communication deficit: Secondary | ICD-10-CM | POA: Diagnosis not present

## 2021-07-12 DIAGNOSIS — R269 Unspecified abnormalities of gait and mobility: Secondary | ICD-10-CM | POA: Diagnosis not present

## 2021-07-12 DIAGNOSIS — R2689 Other abnormalities of gait and mobility: Secondary | ICD-10-CM | POA: Diagnosis not present

## 2021-07-13 DIAGNOSIS — R41841 Cognitive communication deficit: Secondary | ICD-10-CM | POA: Diagnosis not present

## 2021-07-13 DIAGNOSIS — M6281 Muscle weakness (generalized): Secondary | ICD-10-CM | POA: Diagnosis not present

## 2021-07-13 DIAGNOSIS — R2689 Other abnormalities of gait and mobility: Secondary | ICD-10-CM | POA: Diagnosis not present

## 2021-07-13 DIAGNOSIS — R269 Unspecified abnormalities of gait and mobility: Secondary | ICD-10-CM | POA: Diagnosis not present

## 2021-07-19 DIAGNOSIS — R41841 Cognitive communication deficit: Secondary | ICD-10-CM | POA: Diagnosis not present

## 2021-07-19 DIAGNOSIS — R2689 Other abnormalities of gait and mobility: Secondary | ICD-10-CM | POA: Diagnosis not present

## 2021-07-19 DIAGNOSIS — R269 Unspecified abnormalities of gait and mobility: Secondary | ICD-10-CM | POA: Diagnosis not present

## 2021-07-19 DIAGNOSIS — M6281 Muscle weakness (generalized): Secondary | ICD-10-CM | POA: Diagnosis not present

## 2021-07-21 DIAGNOSIS — R269 Unspecified abnormalities of gait and mobility: Secondary | ICD-10-CM | POA: Diagnosis not present

## 2021-07-21 DIAGNOSIS — R2689 Other abnormalities of gait and mobility: Secondary | ICD-10-CM | POA: Diagnosis not present

## 2021-07-21 DIAGNOSIS — R41841 Cognitive communication deficit: Secondary | ICD-10-CM | POA: Diagnosis not present

## 2021-07-21 DIAGNOSIS — M6281 Muscle weakness (generalized): Secondary | ICD-10-CM | POA: Diagnosis not present

## 2021-07-24 DIAGNOSIS — R269 Unspecified abnormalities of gait and mobility: Secondary | ICD-10-CM | POA: Diagnosis not present

## 2021-07-24 DIAGNOSIS — R2689 Other abnormalities of gait and mobility: Secondary | ICD-10-CM | POA: Diagnosis not present

## 2021-07-24 DIAGNOSIS — R41841 Cognitive communication deficit: Secondary | ICD-10-CM | POA: Diagnosis not present

## 2021-07-24 DIAGNOSIS — M6281 Muscle weakness (generalized): Secondary | ICD-10-CM | POA: Diagnosis not present

## 2021-07-25 DIAGNOSIS — R269 Unspecified abnormalities of gait and mobility: Secondary | ICD-10-CM | POA: Diagnosis not present

## 2021-07-25 DIAGNOSIS — R2689 Other abnormalities of gait and mobility: Secondary | ICD-10-CM | POA: Diagnosis not present

## 2021-07-25 DIAGNOSIS — M6281 Muscle weakness (generalized): Secondary | ICD-10-CM | POA: Diagnosis not present

## 2021-07-25 DIAGNOSIS — R41841 Cognitive communication deficit: Secondary | ICD-10-CM | POA: Diagnosis not present

## 2021-07-28 DIAGNOSIS — R41841 Cognitive communication deficit: Secondary | ICD-10-CM | POA: Diagnosis not present

## 2021-07-28 DIAGNOSIS — R2689 Other abnormalities of gait and mobility: Secondary | ICD-10-CM | POA: Diagnosis not present

## 2021-07-28 DIAGNOSIS — M6281 Muscle weakness (generalized): Secondary | ICD-10-CM | POA: Diagnosis not present

## 2021-07-28 DIAGNOSIS — R269 Unspecified abnormalities of gait and mobility: Secondary | ICD-10-CM | POA: Diagnosis not present

## 2021-08-01 DIAGNOSIS — R2689 Other abnormalities of gait and mobility: Secondary | ICD-10-CM | POA: Diagnosis not present

## 2021-08-01 DIAGNOSIS — R269 Unspecified abnormalities of gait and mobility: Secondary | ICD-10-CM | POA: Diagnosis not present

## 2021-08-01 DIAGNOSIS — R41841 Cognitive communication deficit: Secondary | ICD-10-CM | POA: Diagnosis not present

## 2021-08-01 DIAGNOSIS — M6281 Muscle weakness (generalized): Secondary | ICD-10-CM | POA: Diagnosis not present

## 2021-08-02 DIAGNOSIS — M6281 Muscle weakness (generalized): Secondary | ICD-10-CM | POA: Diagnosis not present

## 2021-08-02 DIAGNOSIS — R2689 Other abnormalities of gait and mobility: Secondary | ICD-10-CM | POA: Diagnosis not present

## 2021-08-02 DIAGNOSIS — R41841 Cognitive communication deficit: Secondary | ICD-10-CM | POA: Diagnosis not present

## 2021-08-02 DIAGNOSIS — R269 Unspecified abnormalities of gait and mobility: Secondary | ICD-10-CM | POA: Diagnosis not present

## 2021-08-04 DIAGNOSIS — M6281 Muscle weakness (generalized): Secondary | ICD-10-CM | POA: Diagnosis not present

## 2021-08-04 DIAGNOSIS — R269 Unspecified abnormalities of gait and mobility: Secondary | ICD-10-CM | POA: Diagnosis not present

## 2021-08-04 DIAGNOSIS — R41841 Cognitive communication deficit: Secondary | ICD-10-CM | POA: Diagnosis not present

## 2021-08-04 DIAGNOSIS — R2689 Other abnormalities of gait and mobility: Secondary | ICD-10-CM | POA: Diagnosis not present

## 2021-08-08 DIAGNOSIS — R269 Unspecified abnormalities of gait and mobility: Secondary | ICD-10-CM | POA: Diagnosis not present

## 2021-08-08 DIAGNOSIS — R41841 Cognitive communication deficit: Secondary | ICD-10-CM | POA: Diagnosis not present

## 2021-08-08 DIAGNOSIS — M6281 Muscle weakness (generalized): Secondary | ICD-10-CM | POA: Diagnosis not present

## 2021-08-08 DIAGNOSIS — R2689 Other abnormalities of gait and mobility: Secondary | ICD-10-CM | POA: Diagnosis not present

## 2021-08-09 DIAGNOSIS — R2689 Other abnormalities of gait and mobility: Secondary | ICD-10-CM | POA: Diagnosis not present

## 2021-08-09 DIAGNOSIS — R269 Unspecified abnormalities of gait and mobility: Secondary | ICD-10-CM | POA: Diagnosis not present

## 2021-08-09 DIAGNOSIS — M6281 Muscle weakness (generalized): Secondary | ICD-10-CM | POA: Diagnosis not present

## 2021-08-09 DIAGNOSIS — R41841 Cognitive communication deficit: Secondary | ICD-10-CM | POA: Diagnosis not present

## 2021-08-10 DIAGNOSIS — R2689 Other abnormalities of gait and mobility: Secondary | ICD-10-CM | POA: Diagnosis not present

## 2021-08-10 DIAGNOSIS — M6281 Muscle weakness (generalized): Secondary | ICD-10-CM | POA: Diagnosis not present

## 2021-08-10 DIAGNOSIS — R41841 Cognitive communication deficit: Secondary | ICD-10-CM | POA: Diagnosis not present

## 2021-08-10 DIAGNOSIS — R269 Unspecified abnormalities of gait and mobility: Secondary | ICD-10-CM | POA: Diagnosis not present

## 2021-08-11 DIAGNOSIS — M6281 Muscle weakness (generalized): Secondary | ICD-10-CM | POA: Diagnosis not present

## 2021-08-11 DIAGNOSIS — R2689 Other abnormalities of gait and mobility: Secondary | ICD-10-CM | POA: Diagnosis not present

## 2021-08-11 DIAGNOSIS — R269 Unspecified abnormalities of gait and mobility: Secondary | ICD-10-CM | POA: Diagnosis not present

## 2021-08-11 DIAGNOSIS — R41841 Cognitive communication deficit: Secondary | ICD-10-CM | POA: Diagnosis not present

## 2021-08-15 DIAGNOSIS — R41841 Cognitive communication deficit: Secondary | ICD-10-CM | POA: Diagnosis not present

## 2021-08-15 DIAGNOSIS — M6281 Muscle weakness (generalized): Secondary | ICD-10-CM | POA: Diagnosis not present

## 2021-08-15 DIAGNOSIS — R269 Unspecified abnormalities of gait and mobility: Secondary | ICD-10-CM | POA: Diagnosis not present

## 2021-08-15 DIAGNOSIS — R2689 Other abnormalities of gait and mobility: Secondary | ICD-10-CM | POA: Diagnosis not present

## 2021-08-17 DIAGNOSIS — M6281 Muscle weakness (generalized): Secondary | ICD-10-CM | POA: Diagnosis not present

## 2021-08-17 DIAGNOSIS — R41841 Cognitive communication deficit: Secondary | ICD-10-CM | POA: Diagnosis not present

## 2021-08-17 DIAGNOSIS — R269 Unspecified abnormalities of gait and mobility: Secondary | ICD-10-CM | POA: Diagnosis not present

## 2021-08-17 DIAGNOSIS — R2689 Other abnormalities of gait and mobility: Secondary | ICD-10-CM | POA: Diagnosis not present

## 2021-08-18 DIAGNOSIS — R269 Unspecified abnormalities of gait and mobility: Secondary | ICD-10-CM | POA: Diagnosis not present

## 2021-08-18 DIAGNOSIS — R2689 Other abnormalities of gait and mobility: Secondary | ICD-10-CM | POA: Diagnosis not present

## 2021-08-18 DIAGNOSIS — M6281 Muscle weakness (generalized): Secondary | ICD-10-CM | POA: Diagnosis not present

## 2021-08-18 DIAGNOSIS — R41841 Cognitive communication deficit: Secondary | ICD-10-CM | POA: Diagnosis not present

## 2021-08-22 DIAGNOSIS — R41841 Cognitive communication deficit: Secondary | ICD-10-CM | POA: Diagnosis not present

## 2021-08-22 DIAGNOSIS — R269 Unspecified abnormalities of gait and mobility: Secondary | ICD-10-CM | POA: Diagnosis not present

## 2021-08-22 DIAGNOSIS — R2689 Other abnormalities of gait and mobility: Secondary | ICD-10-CM | POA: Diagnosis not present

## 2021-08-22 DIAGNOSIS — M6281 Muscle weakness (generalized): Secondary | ICD-10-CM | POA: Diagnosis not present

## 2021-08-24 DIAGNOSIS — M6281 Muscle weakness (generalized): Secondary | ICD-10-CM | POA: Diagnosis not present

## 2021-08-24 DIAGNOSIS — R269 Unspecified abnormalities of gait and mobility: Secondary | ICD-10-CM | POA: Diagnosis not present

## 2021-08-24 DIAGNOSIS — R2689 Other abnormalities of gait and mobility: Secondary | ICD-10-CM | POA: Diagnosis not present

## 2021-08-24 DIAGNOSIS — R41841 Cognitive communication deficit: Secondary | ICD-10-CM | POA: Diagnosis not present

## 2021-08-25 DIAGNOSIS — R269 Unspecified abnormalities of gait and mobility: Secondary | ICD-10-CM | POA: Diagnosis not present

## 2021-08-25 DIAGNOSIS — M6281 Muscle weakness (generalized): Secondary | ICD-10-CM | POA: Diagnosis not present

## 2021-08-25 DIAGNOSIS — R41841 Cognitive communication deficit: Secondary | ICD-10-CM | POA: Diagnosis not present

## 2021-08-25 DIAGNOSIS — R2689 Other abnormalities of gait and mobility: Secondary | ICD-10-CM | POA: Diagnosis not present

## 2021-08-30 DIAGNOSIS — R41841 Cognitive communication deficit: Secondary | ICD-10-CM | POA: Diagnosis not present

## 2021-08-31 DIAGNOSIS — R41841 Cognitive communication deficit: Secondary | ICD-10-CM | POA: Diagnosis not present

## 2021-09-01 DIAGNOSIS — R41841 Cognitive communication deficit: Secondary | ICD-10-CM | POA: Diagnosis not present

## 2021-09-07 DIAGNOSIS — R41841 Cognitive communication deficit: Secondary | ICD-10-CM | POA: Diagnosis not present

## 2021-09-08 DIAGNOSIS — R41841 Cognitive communication deficit: Secondary | ICD-10-CM | POA: Diagnosis not present

## 2021-09-13 DIAGNOSIS — R41841 Cognitive communication deficit: Secondary | ICD-10-CM | POA: Diagnosis not present

## 2021-09-14 DIAGNOSIS — R41841 Cognitive communication deficit: Secondary | ICD-10-CM | POA: Diagnosis not present

## 2021-09-15 DIAGNOSIS — R41841 Cognitive communication deficit: Secondary | ICD-10-CM | POA: Diagnosis not present

## 2021-09-19 DIAGNOSIS — R41841 Cognitive communication deficit: Secondary | ICD-10-CM | POA: Diagnosis not present

## 2021-09-21 DIAGNOSIS — R41841 Cognitive communication deficit: Secondary | ICD-10-CM | POA: Diagnosis not present

## 2021-09-25 DIAGNOSIS — R41841 Cognitive communication deficit: Secondary | ICD-10-CM | POA: Diagnosis not present

## 2021-09-28 DIAGNOSIS — R41841 Cognitive communication deficit: Secondary | ICD-10-CM | POA: Diagnosis not present

## 2021-10-03 DIAGNOSIS — R41841 Cognitive communication deficit: Secondary | ICD-10-CM | POA: Diagnosis not present

## 2021-10-04 DIAGNOSIS — R41841 Cognitive communication deficit: Secondary | ICD-10-CM | POA: Diagnosis not present

## 2021-10-09 DIAGNOSIS — R41841 Cognitive communication deficit: Secondary | ICD-10-CM | POA: Diagnosis not present

## 2021-10-10 DIAGNOSIS — R41841 Cognitive communication deficit: Secondary | ICD-10-CM | POA: Diagnosis not present

## 2021-10-12 DIAGNOSIS — R41841 Cognitive communication deficit: Secondary | ICD-10-CM | POA: Diagnosis not present

## 2021-10-17 DIAGNOSIS — R41841 Cognitive communication deficit: Secondary | ICD-10-CM | POA: Diagnosis not present

## 2021-12-25 DIAGNOSIS — K219 Gastro-esophageal reflux disease without esophagitis: Secondary | ICD-10-CM | POA: Diagnosis not present

## 2021-12-25 DIAGNOSIS — I10 Essential (primary) hypertension: Secondary | ICD-10-CM | POA: Diagnosis not present

## 2021-12-25 DIAGNOSIS — E039 Hypothyroidism, unspecified: Secondary | ICD-10-CM | POA: Diagnosis not present

## 2021-12-25 DIAGNOSIS — F03B0 Unspecified dementia, moderate, without behavioral disturbance, psychotic disturbance, mood disturbance, and anxiety: Secondary | ICD-10-CM | POA: Diagnosis not present

## 2021-12-25 DIAGNOSIS — Z0000 Encounter for general adult medical examination without abnormal findings: Secondary | ICD-10-CM | POA: Diagnosis not present

## 2021-12-25 DIAGNOSIS — E7800 Pure hypercholesterolemia, unspecified: Secondary | ICD-10-CM | POA: Diagnosis not present

## 2021-12-25 DIAGNOSIS — I700 Atherosclerosis of aorta: Secondary | ICD-10-CM | POA: Diagnosis not present

## 2021-12-25 DIAGNOSIS — Z23 Encounter for immunization: Secondary | ICD-10-CM | POA: Diagnosis not present

## 2022-01-01 DIAGNOSIS — G8929 Other chronic pain: Secondary | ICD-10-CM | POA: Diagnosis not present

## 2022-01-01 DIAGNOSIS — K219 Gastro-esophageal reflux disease without esophagitis: Secondary | ICD-10-CM | POA: Diagnosis not present

## 2022-01-01 DIAGNOSIS — I10 Essential (primary) hypertension: Secondary | ICD-10-CM | POA: Diagnosis not present

## 2022-01-01 DIAGNOSIS — E039 Hypothyroidism, unspecified: Secondary | ICD-10-CM | POA: Diagnosis not present

## 2022-01-01 DIAGNOSIS — E7800 Pure hypercholesterolemia, unspecified: Secondary | ICD-10-CM | POA: Diagnosis not present

## 2022-01-01 DIAGNOSIS — E1140 Type 2 diabetes mellitus with diabetic neuropathy, unspecified: Secondary | ICD-10-CM | POA: Diagnosis not present

## 2022-01-01 DIAGNOSIS — M1712 Unilateral primary osteoarthritis, left knee: Secondary | ICD-10-CM | POA: Diagnosis not present

## 2022-01-02 DIAGNOSIS — R41841 Cognitive communication deficit: Secondary | ICD-10-CM | POA: Diagnosis not present

## 2022-01-03 DIAGNOSIS — R41841 Cognitive communication deficit: Secondary | ICD-10-CM | POA: Diagnosis not present

## 2022-01-05 DIAGNOSIS — R41841 Cognitive communication deficit: Secondary | ICD-10-CM | POA: Diagnosis not present

## 2022-01-08 DIAGNOSIS — R41841 Cognitive communication deficit: Secondary | ICD-10-CM | POA: Diagnosis not present

## 2022-01-10 DIAGNOSIS — R41841 Cognitive communication deficit: Secondary | ICD-10-CM | POA: Diagnosis not present

## 2022-01-11 DIAGNOSIS — R41841 Cognitive communication deficit: Secondary | ICD-10-CM | POA: Diagnosis not present

## 2022-01-15 DIAGNOSIS — R41841 Cognitive communication deficit: Secondary | ICD-10-CM | POA: Diagnosis not present

## 2022-01-16 DIAGNOSIS — R41841 Cognitive communication deficit: Secondary | ICD-10-CM | POA: Diagnosis not present

## 2022-01-17 DIAGNOSIS — R41841 Cognitive communication deficit: Secondary | ICD-10-CM | POA: Diagnosis not present

## 2022-01-22 DIAGNOSIS — R41841 Cognitive communication deficit: Secondary | ICD-10-CM | POA: Diagnosis not present

## 2022-01-23 DIAGNOSIS — R41841 Cognitive communication deficit: Secondary | ICD-10-CM | POA: Diagnosis not present

## 2022-01-26 DIAGNOSIS — R41841 Cognitive communication deficit: Secondary | ICD-10-CM | POA: Diagnosis not present

## 2022-01-29 DIAGNOSIS — R41841 Cognitive communication deficit: Secondary | ICD-10-CM | POA: Diagnosis not present

## 2022-01-31 DIAGNOSIS — R41841 Cognitive communication deficit: Secondary | ICD-10-CM | POA: Diagnosis not present

## 2022-02-02 DIAGNOSIS — R41841 Cognitive communication deficit: Secondary | ICD-10-CM | POA: Diagnosis not present

## 2022-02-05 DIAGNOSIS — R41841 Cognitive communication deficit: Secondary | ICD-10-CM | POA: Diagnosis not present

## 2022-02-07 DIAGNOSIS — R41841 Cognitive communication deficit: Secondary | ICD-10-CM | POA: Diagnosis not present

## 2022-02-09 DIAGNOSIS — R41841 Cognitive communication deficit: Secondary | ICD-10-CM | POA: Diagnosis not present

## 2022-02-12 DIAGNOSIS — R41841 Cognitive communication deficit: Secondary | ICD-10-CM | POA: Diagnosis not present

## 2022-02-13 DIAGNOSIS — R41841 Cognitive communication deficit: Secondary | ICD-10-CM | POA: Diagnosis not present

## 2022-02-14 DIAGNOSIS — R41841 Cognitive communication deficit: Secondary | ICD-10-CM | POA: Diagnosis not present

## 2022-02-16 ENCOUNTER — Other Ambulatory Visit: Payer: Self-pay | Admitting: Family Medicine

## 2022-02-16 DIAGNOSIS — Z1231 Encounter for screening mammogram for malignant neoplasm of breast: Secondary | ICD-10-CM

## 2022-02-16 DIAGNOSIS — R41841 Cognitive communication deficit: Secondary | ICD-10-CM | POA: Diagnosis not present

## 2022-02-21 DIAGNOSIS — R41841 Cognitive communication deficit: Secondary | ICD-10-CM | POA: Diagnosis not present

## 2022-02-22 DIAGNOSIS — R41841 Cognitive communication deficit: Secondary | ICD-10-CM | POA: Diagnosis not present

## 2022-02-23 DIAGNOSIS — R41841 Cognitive communication deficit: Secondary | ICD-10-CM | POA: Diagnosis not present

## 2022-02-27 DIAGNOSIS — R41841 Cognitive communication deficit: Secondary | ICD-10-CM | POA: Diagnosis not present

## 2022-02-28 DIAGNOSIS — R41841 Cognitive communication deficit: Secondary | ICD-10-CM | POA: Diagnosis not present

## 2022-03-01 DIAGNOSIS — R41841 Cognitive communication deficit: Secondary | ICD-10-CM | POA: Diagnosis not present

## 2022-03-05 DIAGNOSIS — R41841 Cognitive communication deficit: Secondary | ICD-10-CM | POA: Diagnosis not present

## 2022-03-06 DIAGNOSIS — R41841 Cognitive communication deficit: Secondary | ICD-10-CM | POA: Diagnosis not present

## 2022-03-09 DIAGNOSIS — R41841 Cognitive communication deficit: Secondary | ICD-10-CM | POA: Diagnosis not present

## 2022-03-12 DIAGNOSIS — R41841 Cognitive communication deficit: Secondary | ICD-10-CM | POA: Diagnosis not present

## 2022-03-13 DIAGNOSIS — R41841 Cognitive communication deficit: Secondary | ICD-10-CM | POA: Diagnosis not present

## 2022-03-14 DIAGNOSIS — R41841 Cognitive communication deficit: Secondary | ICD-10-CM | POA: Diagnosis not present

## 2022-03-19 DIAGNOSIS — R41841 Cognitive communication deficit: Secondary | ICD-10-CM | POA: Diagnosis not present

## 2022-03-21 DIAGNOSIS — R41841 Cognitive communication deficit: Secondary | ICD-10-CM | POA: Diagnosis not present

## 2022-03-22 DIAGNOSIS — R41841 Cognitive communication deficit: Secondary | ICD-10-CM | POA: Diagnosis not present

## 2022-03-27 DIAGNOSIS — R41841 Cognitive communication deficit: Secondary | ICD-10-CM | POA: Diagnosis not present

## 2022-03-28 DIAGNOSIS — R41841 Cognitive communication deficit: Secondary | ICD-10-CM | POA: Diagnosis not present

## 2022-03-30 DIAGNOSIS — R41841 Cognitive communication deficit: Secondary | ICD-10-CM | POA: Diagnosis not present

## 2022-04-03 DIAGNOSIS — R41841 Cognitive communication deficit: Secondary | ICD-10-CM | POA: Diagnosis not present

## 2022-04-05 DIAGNOSIS — R41841 Cognitive communication deficit: Secondary | ICD-10-CM | POA: Diagnosis not present

## 2022-04-06 DIAGNOSIS — R41841 Cognitive communication deficit: Secondary | ICD-10-CM | POA: Diagnosis not present

## 2022-04-09 DIAGNOSIS — R41841 Cognitive communication deficit: Secondary | ICD-10-CM | POA: Diagnosis not present

## 2022-04-10 DIAGNOSIS — R41841 Cognitive communication deficit: Secondary | ICD-10-CM | POA: Diagnosis not present

## 2022-04-12 DIAGNOSIS — K219 Gastro-esophageal reflux disease without esophagitis: Secondary | ICD-10-CM | POA: Diagnosis not present

## 2022-04-12 DIAGNOSIS — E7800 Pure hypercholesterolemia, unspecified: Secondary | ICD-10-CM | POA: Diagnosis not present

## 2022-04-12 DIAGNOSIS — I10 Essential (primary) hypertension: Secondary | ICD-10-CM | POA: Diagnosis not present

## 2022-04-12 DIAGNOSIS — E1140 Type 2 diabetes mellitus with diabetic neuropathy, unspecified: Secondary | ICD-10-CM | POA: Diagnosis not present

## 2022-04-12 DIAGNOSIS — G8929 Other chronic pain: Secondary | ICD-10-CM | POA: Diagnosis not present

## 2022-04-12 DIAGNOSIS — M8580 Other specified disorders of bone density and structure, unspecified site: Secondary | ICD-10-CM | POA: Diagnosis not present

## 2022-04-12 DIAGNOSIS — E039 Hypothyroidism, unspecified: Secondary | ICD-10-CM | POA: Diagnosis not present

## 2022-04-13 DIAGNOSIS — R41841 Cognitive communication deficit: Secondary | ICD-10-CM | POA: Diagnosis not present

## 2022-04-16 ENCOUNTER — Ambulatory Visit: Payer: Medicare Other

## 2022-04-16 DIAGNOSIS — R41841 Cognitive communication deficit: Secondary | ICD-10-CM | POA: Diagnosis not present

## 2022-04-17 DIAGNOSIS — R41841 Cognitive communication deficit: Secondary | ICD-10-CM | POA: Diagnosis not present

## 2022-04-20 DIAGNOSIS — R41841 Cognitive communication deficit: Secondary | ICD-10-CM | POA: Diagnosis not present

## 2022-04-24 DIAGNOSIS — R41841 Cognitive communication deficit: Secondary | ICD-10-CM | POA: Diagnosis not present

## 2022-04-25 DIAGNOSIS — R41841 Cognitive communication deficit: Secondary | ICD-10-CM | POA: Diagnosis not present

## 2022-05-01 DIAGNOSIS — R41841 Cognitive communication deficit: Secondary | ICD-10-CM | POA: Diagnosis not present

## 2022-05-03 DIAGNOSIS — R41841 Cognitive communication deficit: Secondary | ICD-10-CM | POA: Diagnosis not present

## 2022-05-04 DIAGNOSIS — R41841 Cognitive communication deficit: Secondary | ICD-10-CM | POA: Diagnosis not present

## 2022-05-07 DIAGNOSIS — R41841 Cognitive communication deficit: Secondary | ICD-10-CM | POA: Diagnosis not present

## 2022-05-08 DIAGNOSIS — R41841 Cognitive communication deficit: Secondary | ICD-10-CM | POA: Diagnosis not present

## 2022-05-09 DIAGNOSIS — R41841 Cognitive communication deficit: Secondary | ICD-10-CM | POA: Diagnosis not present

## 2022-05-16 DIAGNOSIS — R41841 Cognitive communication deficit: Secondary | ICD-10-CM | POA: Diagnosis not present

## 2022-05-17 DIAGNOSIS — R41841 Cognitive communication deficit: Secondary | ICD-10-CM | POA: Diagnosis not present

## 2022-05-18 DIAGNOSIS — R41841 Cognitive communication deficit: Secondary | ICD-10-CM | POA: Diagnosis not present

## 2022-05-21 DIAGNOSIS — R41841 Cognitive communication deficit: Secondary | ICD-10-CM | POA: Diagnosis not present

## 2022-05-23 DIAGNOSIS — R41841 Cognitive communication deficit: Secondary | ICD-10-CM | POA: Diagnosis not present

## 2022-05-24 DIAGNOSIS — R41841 Cognitive communication deficit: Secondary | ICD-10-CM | POA: Diagnosis not present

## 2022-05-25 DIAGNOSIS — R41841 Cognitive communication deficit: Secondary | ICD-10-CM | POA: Diagnosis not present

## 2022-05-29 ENCOUNTER — Ambulatory Visit: Payer: Medicare Other

## 2022-07-03 DIAGNOSIS — G8929 Other chronic pain: Secondary | ICD-10-CM | POA: Diagnosis not present

## 2022-07-03 DIAGNOSIS — E1140 Type 2 diabetes mellitus with diabetic neuropathy, unspecified: Secondary | ICD-10-CM | POA: Diagnosis not present

## 2022-07-03 DIAGNOSIS — M8580 Other specified disorders of bone density and structure, unspecified site: Secondary | ICD-10-CM | POA: Diagnosis not present

## 2022-07-03 DIAGNOSIS — M1712 Unilateral primary osteoarthritis, left knee: Secondary | ICD-10-CM | POA: Diagnosis not present

## 2022-07-03 DIAGNOSIS — E039 Hypothyroidism, unspecified: Secondary | ICD-10-CM | POA: Diagnosis not present

## 2022-07-03 DIAGNOSIS — I10 Essential (primary) hypertension: Secondary | ICD-10-CM | POA: Diagnosis not present

## 2022-07-03 DIAGNOSIS — K219 Gastro-esophageal reflux disease without esophagitis: Secondary | ICD-10-CM | POA: Diagnosis not present

## 2022-07-03 DIAGNOSIS — E7800 Pure hypercholesterolemia, unspecified: Secondary | ICD-10-CM | POA: Diagnosis not present

## 2022-07-09 ENCOUNTER — Ambulatory Visit: Payer: Medicare Other

## 2022-08-28 DIAGNOSIS — K219 Gastro-esophageal reflux disease without esophagitis: Secondary | ICD-10-CM | POA: Diagnosis not present

## 2022-08-28 DIAGNOSIS — E1140 Type 2 diabetes mellitus with diabetic neuropathy, unspecified: Secondary | ICD-10-CM | POA: Diagnosis not present

## 2022-08-28 DIAGNOSIS — E7800 Pure hypercholesterolemia, unspecified: Secondary | ICD-10-CM | POA: Diagnosis not present

## 2022-08-28 DIAGNOSIS — I10 Essential (primary) hypertension: Secondary | ICD-10-CM | POA: Diagnosis not present

## 2022-08-28 DIAGNOSIS — M47896 Other spondylosis, lumbar region: Secondary | ICD-10-CM | POA: Diagnosis not present

## 2022-08-28 DIAGNOSIS — E039 Hypothyroidism, unspecified: Secondary | ICD-10-CM | POA: Diagnosis not present

## 2022-08-28 DIAGNOSIS — M1712 Unilateral primary osteoarthritis, left knee: Secondary | ICD-10-CM | POA: Diagnosis not present

## 2022-08-28 DIAGNOSIS — G8929 Other chronic pain: Secondary | ICD-10-CM | POA: Diagnosis not present

## 2022-08-28 DIAGNOSIS — M8580 Other specified disorders of bone density and structure, unspecified site: Secondary | ICD-10-CM | POA: Diagnosis not present

## 2022-10-16 DIAGNOSIS — M62542 Muscle wasting and atrophy, not elsewhere classified, left hand: Secondary | ICD-10-CM | POA: Diagnosis not present

## 2022-10-16 DIAGNOSIS — R296 Repeated falls: Secondary | ICD-10-CM | POA: Diagnosis not present

## 2022-10-16 DIAGNOSIS — M62541 Muscle wasting and atrophy, not elsewhere classified, right hand: Secondary | ICD-10-CM | POA: Diagnosis not present

## 2022-10-16 DIAGNOSIS — R278 Other lack of coordination: Secondary | ICD-10-CM | POA: Diagnosis not present

## 2022-10-18 DIAGNOSIS — M62541 Muscle wasting and atrophy, not elsewhere classified, right hand: Secondary | ICD-10-CM | POA: Diagnosis not present

## 2022-10-18 DIAGNOSIS — R296 Repeated falls: Secondary | ICD-10-CM | POA: Diagnosis not present

## 2022-10-18 DIAGNOSIS — M62542 Muscle wasting and atrophy, not elsewhere classified, left hand: Secondary | ICD-10-CM | POA: Diagnosis not present

## 2022-10-18 DIAGNOSIS — R278 Other lack of coordination: Secondary | ICD-10-CM | POA: Diagnosis not present

## 2022-10-19 DIAGNOSIS — M62542 Muscle wasting and atrophy, not elsewhere classified, left hand: Secondary | ICD-10-CM | POA: Diagnosis not present

## 2022-10-19 DIAGNOSIS — R278 Other lack of coordination: Secondary | ICD-10-CM | POA: Diagnosis not present

## 2022-10-19 DIAGNOSIS — M62541 Muscle wasting and atrophy, not elsewhere classified, right hand: Secondary | ICD-10-CM | POA: Diagnosis not present

## 2022-10-19 DIAGNOSIS — R296 Repeated falls: Secondary | ICD-10-CM | POA: Diagnosis not present

## 2022-10-22 DIAGNOSIS — R278 Other lack of coordination: Secondary | ICD-10-CM | POA: Diagnosis not present

## 2022-10-22 DIAGNOSIS — M62542 Muscle wasting and atrophy, not elsewhere classified, left hand: Secondary | ICD-10-CM | POA: Diagnosis not present

## 2022-10-22 DIAGNOSIS — R296 Repeated falls: Secondary | ICD-10-CM | POA: Diagnosis not present

## 2022-10-22 DIAGNOSIS — M62541 Muscle wasting and atrophy, not elsewhere classified, right hand: Secondary | ICD-10-CM | POA: Diagnosis not present

## 2022-10-23 DIAGNOSIS — R296 Repeated falls: Secondary | ICD-10-CM | POA: Diagnosis not present

## 2022-10-23 DIAGNOSIS — M62541 Muscle wasting and atrophy, not elsewhere classified, right hand: Secondary | ICD-10-CM | POA: Diagnosis not present

## 2022-10-23 DIAGNOSIS — R278 Other lack of coordination: Secondary | ICD-10-CM | POA: Diagnosis not present

## 2022-10-23 DIAGNOSIS — M62542 Muscle wasting and atrophy, not elsewhere classified, left hand: Secondary | ICD-10-CM | POA: Diagnosis not present

## 2022-10-24 DIAGNOSIS — M62541 Muscle wasting and atrophy, not elsewhere classified, right hand: Secondary | ICD-10-CM | POA: Diagnosis not present

## 2022-10-24 DIAGNOSIS — R296 Repeated falls: Secondary | ICD-10-CM | POA: Diagnosis not present

## 2022-10-24 DIAGNOSIS — R278 Other lack of coordination: Secondary | ICD-10-CM | POA: Diagnosis not present

## 2022-10-24 DIAGNOSIS — M62542 Muscle wasting and atrophy, not elsewhere classified, left hand: Secondary | ICD-10-CM | POA: Diagnosis not present

## 2022-10-26 DIAGNOSIS — R278 Other lack of coordination: Secondary | ICD-10-CM | POA: Diagnosis not present

## 2022-10-26 DIAGNOSIS — R296 Repeated falls: Secondary | ICD-10-CM | POA: Diagnosis not present

## 2022-10-26 DIAGNOSIS — M62542 Muscle wasting and atrophy, not elsewhere classified, left hand: Secondary | ICD-10-CM | POA: Diagnosis not present

## 2022-10-26 DIAGNOSIS — M62541 Muscle wasting and atrophy, not elsewhere classified, right hand: Secondary | ICD-10-CM | POA: Diagnosis not present

## 2022-10-30 DIAGNOSIS — R278 Other lack of coordination: Secondary | ICD-10-CM | POA: Diagnosis not present

## 2022-10-30 DIAGNOSIS — M62541 Muscle wasting and atrophy, not elsewhere classified, right hand: Secondary | ICD-10-CM | POA: Diagnosis not present

## 2022-10-30 DIAGNOSIS — M62542 Muscle wasting and atrophy, not elsewhere classified, left hand: Secondary | ICD-10-CM | POA: Diagnosis not present

## 2022-10-30 DIAGNOSIS — R296 Repeated falls: Secondary | ICD-10-CM | POA: Diagnosis not present

## 2022-10-31 DIAGNOSIS — R296 Repeated falls: Secondary | ICD-10-CM | POA: Diagnosis not present

## 2022-10-31 DIAGNOSIS — M62542 Muscle wasting and atrophy, not elsewhere classified, left hand: Secondary | ICD-10-CM | POA: Diagnosis not present

## 2022-10-31 DIAGNOSIS — R278 Other lack of coordination: Secondary | ICD-10-CM | POA: Diagnosis not present

## 2022-10-31 DIAGNOSIS — M62541 Muscle wasting and atrophy, not elsewhere classified, right hand: Secondary | ICD-10-CM | POA: Diagnosis not present

## 2022-11-01 DIAGNOSIS — R278 Other lack of coordination: Secondary | ICD-10-CM | POA: Diagnosis not present

## 2022-11-01 DIAGNOSIS — M62542 Muscle wasting and atrophy, not elsewhere classified, left hand: Secondary | ICD-10-CM | POA: Diagnosis not present

## 2022-11-01 DIAGNOSIS — R296 Repeated falls: Secondary | ICD-10-CM | POA: Diagnosis not present

## 2022-11-01 DIAGNOSIS — M62541 Muscle wasting and atrophy, not elsewhere classified, right hand: Secondary | ICD-10-CM | POA: Diagnosis not present

## 2022-11-05 DIAGNOSIS — M62541 Muscle wasting and atrophy, not elsewhere classified, right hand: Secondary | ICD-10-CM | POA: Diagnosis not present

## 2022-11-05 DIAGNOSIS — M62542 Muscle wasting and atrophy, not elsewhere classified, left hand: Secondary | ICD-10-CM | POA: Diagnosis not present

## 2022-11-05 DIAGNOSIS — R296 Repeated falls: Secondary | ICD-10-CM | POA: Diagnosis not present

## 2022-11-05 DIAGNOSIS — R278 Other lack of coordination: Secondary | ICD-10-CM | POA: Diagnosis not present

## 2022-11-06 DIAGNOSIS — R296 Repeated falls: Secondary | ICD-10-CM | POA: Diagnosis not present

## 2022-11-06 DIAGNOSIS — M62541 Muscle wasting and atrophy, not elsewhere classified, right hand: Secondary | ICD-10-CM | POA: Diagnosis not present

## 2022-11-06 DIAGNOSIS — M62542 Muscle wasting and atrophy, not elsewhere classified, left hand: Secondary | ICD-10-CM | POA: Diagnosis not present

## 2022-11-06 DIAGNOSIS — R278 Other lack of coordination: Secondary | ICD-10-CM | POA: Diagnosis not present

## 2022-11-07 DIAGNOSIS — R296 Repeated falls: Secondary | ICD-10-CM | POA: Diagnosis not present

## 2022-11-07 DIAGNOSIS — M62542 Muscle wasting and atrophy, not elsewhere classified, left hand: Secondary | ICD-10-CM | POA: Diagnosis not present

## 2022-11-07 DIAGNOSIS — M62541 Muscle wasting and atrophy, not elsewhere classified, right hand: Secondary | ICD-10-CM | POA: Diagnosis not present

## 2022-11-07 DIAGNOSIS — R278 Other lack of coordination: Secondary | ICD-10-CM | POA: Diagnosis not present

## 2022-11-08 DIAGNOSIS — M62541 Muscle wasting and atrophy, not elsewhere classified, right hand: Secondary | ICD-10-CM | POA: Diagnosis not present

## 2022-11-08 DIAGNOSIS — R278 Other lack of coordination: Secondary | ICD-10-CM | POA: Diagnosis not present

## 2022-11-08 DIAGNOSIS — M62542 Muscle wasting and atrophy, not elsewhere classified, left hand: Secondary | ICD-10-CM | POA: Diagnosis not present

## 2022-11-08 DIAGNOSIS — R296 Repeated falls: Secondary | ICD-10-CM | POA: Diagnosis not present

## 2022-11-12 DIAGNOSIS — S40811A Abrasion of right upper arm, initial encounter: Secondary | ICD-10-CM | POA: Diagnosis not present

## 2022-11-12 DIAGNOSIS — R278 Other lack of coordination: Secondary | ICD-10-CM | POA: Diagnosis not present

## 2022-11-12 DIAGNOSIS — M62541 Muscle wasting and atrophy, not elsewhere classified, right hand: Secondary | ICD-10-CM | POA: Diagnosis not present

## 2022-11-12 DIAGNOSIS — R296 Repeated falls: Secondary | ICD-10-CM | POA: Diagnosis not present

## 2022-11-12 DIAGNOSIS — Z6827 Body mass index (BMI) 27.0-27.9, adult: Secondary | ICD-10-CM | POA: Diagnosis not present

## 2022-11-12 DIAGNOSIS — L03113 Cellulitis of right upper limb: Secondary | ICD-10-CM | POA: Diagnosis not present

## 2022-11-12 DIAGNOSIS — M62542 Muscle wasting and atrophy, not elsewhere classified, left hand: Secondary | ICD-10-CM | POA: Diagnosis not present

## 2022-11-14 DIAGNOSIS — M62542 Muscle wasting and atrophy, not elsewhere classified, left hand: Secondary | ICD-10-CM | POA: Diagnosis not present

## 2022-11-14 DIAGNOSIS — R296 Repeated falls: Secondary | ICD-10-CM | POA: Diagnosis not present

## 2022-11-14 DIAGNOSIS — M62541 Muscle wasting and atrophy, not elsewhere classified, right hand: Secondary | ICD-10-CM | POA: Diagnosis not present

## 2022-11-14 DIAGNOSIS — R278 Other lack of coordination: Secondary | ICD-10-CM | POA: Diagnosis not present

## 2022-11-15 DIAGNOSIS — M62542 Muscle wasting and atrophy, not elsewhere classified, left hand: Secondary | ICD-10-CM | POA: Diagnosis not present

## 2022-11-15 DIAGNOSIS — R278 Other lack of coordination: Secondary | ICD-10-CM | POA: Diagnosis not present

## 2022-11-15 DIAGNOSIS — M62541 Muscle wasting and atrophy, not elsewhere classified, right hand: Secondary | ICD-10-CM | POA: Diagnosis not present

## 2022-11-15 DIAGNOSIS — R296 Repeated falls: Secondary | ICD-10-CM | POA: Diagnosis not present

## 2022-11-19 DIAGNOSIS — R278 Other lack of coordination: Secondary | ICD-10-CM | POA: Diagnosis not present

## 2022-11-19 DIAGNOSIS — M62541 Muscle wasting and atrophy, not elsewhere classified, right hand: Secondary | ICD-10-CM | POA: Diagnosis not present

## 2022-11-19 DIAGNOSIS — M62542 Muscle wasting and atrophy, not elsewhere classified, left hand: Secondary | ICD-10-CM | POA: Diagnosis not present

## 2022-11-19 DIAGNOSIS — R296 Repeated falls: Secondary | ICD-10-CM | POA: Diagnosis not present

## 2022-11-21 DIAGNOSIS — R278 Other lack of coordination: Secondary | ICD-10-CM | POA: Diagnosis not present

## 2022-11-21 DIAGNOSIS — M62541 Muscle wasting and atrophy, not elsewhere classified, right hand: Secondary | ICD-10-CM | POA: Diagnosis not present

## 2022-11-21 DIAGNOSIS — M62542 Muscle wasting and atrophy, not elsewhere classified, left hand: Secondary | ICD-10-CM | POA: Diagnosis not present

## 2022-11-21 DIAGNOSIS — R296 Repeated falls: Secondary | ICD-10-CM | POA: Diagnosis not present

## 2022-11-23 DIAGNOSIS — R278 Other lack of coordination: Secondary | ICD-10-CM | POA: Diagnosis not present

## 2022-11-23 DIAGNOSIS — R296 Repeated falls: Secondary | ICD-10-CM | POA: Diagnosis not present

## 2022-11-23 DIAGNOSIS — M62542 Muscle wasting and atrophy, not elsewhere classified, left hand: Secondary | ICD-10-CM | POA: Diagnosis not present

## 2022-11-23 DIAGNOSIS — M62541 Muscle wasting and atrophy, not elsewhere classified, right hand: Secondary | ICD-10-CM | POA: Diagnosis not present

## 2022-11-27 DIAGNOSIS — R278 Other lack of coordination: Secondary | ICD-10-CM | POA: Diagnosis not present

## 2022-11-27 DIAGNOSIS — R296 Repeated falls: Secondary | ICD-10-CM | POA: Diagnosis not present

## 2022-11-27 DIAGNOSIS — M62541 Muscle wasting and atrophy, not elsewhere classified, right hand: Secondary | ICD-10-CM | POA: Diagnosis not present

## 2022-11-27 DIAGNOSIS — M62542 Muscle wasting and atrophy, not elsewhere classified, left hand: Secondary | ICD-10-CM | POA: Diagnosis not present

## 2022-11-30 DIAGNOSIS — R278 Other lack of coordination: Secondary | ICD-10-CM | POA: Diagnosis not present

## 2022-11-30 DIAGNOSIS — R296 Repeated falls: Secondary | ICD-10-CM | POA: Diagnosis not present

## 2022-11-30 DIAGNOSIS — M62541 Muscle wasting and atrophy, not elsewhere classified, right hand: Secondary | ICD-10-CM | POA: Diagnosis not present

## 2022-11-30 DIAGNOSIS — M62542 Muscle wasting and atrophy, not elsewhere classified, left hand: Secondary | ICD-10-CM | POA: Diagnosis not present

## 2022-12-04 DIAGNOSIS — M62541 Muscle wasting and atrophy, not elsewhere classified, right hand: Secondary | ICD-10-CM | POA: Diagnosis not present

## 2022-12-04 DIAGNOSIS — R278 Other lack of coordination: Secondary | ICD-10-CM | POA: Diagnosis not present

## 2022-12-04 DIAGNOSIS — M62542 Muscle wasting and atrophy, not elsewhere classified, left hand: Secondary | ICD-10-CM | POA: Diagnosis not present

## 2022-12-04 DIAGNOSIS — R296 Repeated falls: Secondary | ICD-10-CM | POA: Diagnosis not present

## 2022-12-05 DIAGNOSIS — M62541 Muscle wasting and atrophy, not elsewhere classified, right hand: Secondary | ICD-10-CM | POA: Diagnosis not present

## 2022-12-05 DIAGNOSIS — M62542 Muscle wasting and atrophy, not elsewhere classified, left hand: Secondary | ICD-10-CM | POA: Diagnosis not present

## 2022-12-05 DIAGNOSIS — R296 Repeated falls: Secondary | ICD-10-CM | POA: Diagnosis not present

## 2022-12-05 DIAGNOSIS — R278 Other lack of coordination: Secondary | ICD-10-CM | POA: Diagnosis not present

## 2022-12-06 DIAGNOSIS — R296 Repeated falls: Secondary | ICD-10-CM | POA: Diagnosis not present

## 2022-12-06 DIAGNOSIS — M62541 Muscle wasting and atrophy, not elsewhere classified, right hand: Secondary | ICD-10-CM | POA: Diagnosis not present

## 2022-12-06 DIAGNOSIS — M62542 Muscle wasting and atrophy, not elsewhere classified, left hand: Secondary | ICD-10-CM | POA: Diagnosis not present

## 2022-12-06 DIAGNOSIS — R278 Other lack of coordination: Secondary | ICD-10-CM | POA: Diagnosis not present

## 2022-12-10 DIAGNOSIS — R278 Other lack of coordination: Secondary | ICD-10-CM | POA: Diagnosis not present

## 2022-12-10 DIAGNOSIS — M62541 Muscle wasting and atrophy, not elsewhere classified, right hand: Secondary | ICD-10-CM | POA: Diagnosis not present

## 2022-12-10 DIAGNOSIS — M62542 Muscle wasting and atrophy, not elsewhere classified, left hand: Secondary | ICD-10-CM | POA: Diagnosis not present

## 2022-12-10 DIAGNOSIS — R296 Repeated falls: Secondary | ICD-10-CM | POA: Diagnosis not present

## 2022-12-13 DIAGNOSIS — R278 Other lack of coordination: Secondary | ICD-10-CM | POA: Diagnosis not present

## 2022-12-13 DIAGNOSIS — R296 Repeated falls: Secondary | ICD-10-CM | POA: Diagnosis not present

## 2022-12-13 DIAGNOSIS — M62541 Muscle wasting and atrophy, not elsewhere classified, right hand: Secondary | ICD-10-CM | POA: Diagnosis not present

## 2022-12-13 DIAGNOSIS — M62542 Muscle wasting and atrophy, not elsewhere classified, left hand: Secondary | ICD-10-CM | POA: Diagnosis not present

## 2022-12-17 DIAGNOSIS — M62541 Muscle wasting and atrophy, not elsewhere classified, right hand: Secondary | ICD-10-CM | POA: Diagnosis not present

## 2022-12-17 DIAGNOSIS — M62542 Muscle wasting and atrophy, not elsewhere classified, left hand: Secondary | ICD-10-CM | POA: Diagnosis not present

## 2022-12-17 DIAGNOSIS — R278 Other lack of coordination: Secondary | ICD-10-CM | POA: Diagnosis not present

## 2022-12-17 DIAGNOSIS — R296 Repeated falls: Secondary | ICD-10-CM | POA: Diagnosis not present

## 2022-12-19 DIAGNOSIS — M62541 Muscle wasting and atrophy, not elsewhere classified, right hand: Secondary | ICD-10-CM | POA: Diagnosis not present

## 2022-12-19 DIAGNOSIS — R296 Repeated falls: Secondary | ICD-10-CM | POA: Diagnosis not present

## 2022-12-19 DIAGNOSIS — M62542 Muscle wasting and atrophy, not elsewhere classified, left hand: Secondary | ICD-10-CM | POA: Diagnosis not present

## 2022-12-19 DIAGNOSIS — R278 Other lack of coordination: Secondary | ICD-10-CM | POA: Diagnosis not present

## 2022-12-24 DIAGNOSIS — M62541 Muscle wasting and atrophy, not elsewhere classified, right hand: Secondary | ICD-10-CM | POA: Diagnosis not present

## 2022-12-24 DIAGNOSIS — R278 Other lack of coordination: Secondary | ICD-10-CM | POA: Diagnosis not present

## 2022-12-24 DIAGNOSIS — M62542 Muscle wasting and atrophy, not elsewhere classified, left hand: Secondary | ICD-10-CM | POA: Diagnosis not present

## 2022-12-24 DIAGNOSIS — R296 Repeated falls: Secondary | ICD-10-CM | POA: Diagnosis not present

## 2022-12-27 DIAGNOSIS — R296 Repeated falls: Secondary | ICD-10-CM | POA: Diagnosis not present

## 2022-12-27 DIAGNOSIS — M62542 Muscle wasting and atrophy, not elsewhere classified, left hand: Secondary | ICD-10-CM | POA: Diagnosis not present

## 2022-12-27 DIAGNOSIS — R278 Other lack of coordination: Secondary | ICD-10-CM | POA: Diagnosis not present

## 2022-12-27 DIAGNOSIS — M62541 Muscle wasting and atrophy, not elsewhere classified, right hand: Secondary | ICD-10-CM | POA: Diagnosis not present

## 2023-01-01 DIAGNOSIS — M62541 Muscle wasting and atrophy, not elsewhere classified, right hand: Secondary | ICD-10-CM | POA: Diagnosis not present

## 2023-01-01 DIAGNOSIS — R296 Repeated falls: Secondary | ICD-10-CM | POA: Diagnosis not present

## 2023-01-01 DIAGNOSIS — M62542 Muscle wasting and atrophy, not elsewhere classified, left hand: Secondary | ICD-10-CM | POA: Diagnosis not present

## 2023-01-01 DIAGNOSIS — R278 Other lack of coordination: Secondary | ICD-10-CM | POA: Diagnosis not present

## 2023-01-03 DIAGNOSIS — M62541 Muscle wasting and atrophy, not elsewhere classified, right hand: Secondary | ICD-10-CM | POA: Diagnosis not present

## 2023-01-03 DIAGNOSIS — R296 Repeated falls: Secondary | ICD-10-CM | POA: Diagnosis not present

## 2023-01-03 DIAGNOSIS — R278 Other lack of coordination: Secondary | ICD-10-CM | POA: Diagnosis not present

## 2023-01-03 DIAGNOSIS — M62542 Muscle wasting and atrophy, not elsewhere classified, left hand: Secondary | ICD-10-CM | POA: Diagnosis not present

## 2023-01-07 DIAGNOSIS — R296 Repeated falls: Secondary | ICD-10-CM | POA: Diagnosis not present

## 2023-01-07 DIAGNOSIS — R278 Other lack of coordination: Secondary | ICD-10-CM | POA: Diagnosis not present

## 2023-01-07 DIAGNOSIS — M62541 Muscle wasting and atrophy, not elsewhere classified, right hand: Secondary | ICD-10-CM | POA: Diagnosis not present

## 2023-01-07 DIAGNOSIS — M62542 Muscle wasting and atrophy, not elsewhere classified, left hand: Secondary | ICD-10-CM | POA: Diagnosis not present

## 2023-01-10 DIAGNOSIS — M62541 Muscle wasting and atrophy, not elsewhere classified, right hand: Secondary | ICD-10-CM | POA: Diagnosis not present

## 2023-01-10 DIAGNOSIS — R296 Repeated falls: Secondary | ICD-10-CM | POA: Diagnosis not present

## 2023-01-10 DIAGNOSIS — M62542 Muscle wasting and atrophy, not elsewhere classified, left hand: Secondary | ICD-10-CM | POA: Diagnosis not present

## 2023-01-10 DIAGNOSIS — R278 Other lack of coordination: Secondary | ICD-10-CM | POA: Diagnosis not present

## 2023-01-15 DIAGNOSIS — E7800 Pure hypercholesterolemia, unspecified: Secondary | ICD-10-CM | POA: Diagnosis not present

## 2023-01-15 DIAGNOSIS — F03B0 Unspecified dementia, moderate, without behavioral disturbance, psychotic disturbance, mood disturbance, and anxiety: Secondary | ICD-10-CM | POA: Diagnosis not present

## 2023-01-15 DIAGNOSIS — R296 Repeated falls: Secondary | ICD-10-CM | POA: Diagnosis not present

## 2023-01-15 DIAGNOSIS — R278 Other lack of coordination: Secondary | ICD-10-CM | POA: Diagnosis not present

## 2023-01-15 DIAGNOSIS — I10 Essential (primary) hypertension: Secondary | ICD-10-CM | POA: Diagnosis not present

## 2023-01-15 DIAGNOSIS — E039 Hypothyroidism, unspecified: Secondary | ICD-10-CM | POA: Diagnosis not present

## 2023-01-15 DIAGNOSIS — M62541 Muscle wasting and atrophy, not elsewhere classified, right hand: Secondary | ICD-10-CM | POA: Diagnosis not present

## 2023-01-15 DIAGNOSIS — M62542 Muscle wasting and atrophy, not elsewhere classified, left hand: Secondary | ICD-10-CM | POA: Diagnosis not present

## 2023-01-15 DIAGNOSIS — K219 Gastro-esophageal reflux disease without esophagitis: Secondary | ICD-10-CM | POA: Diagnosis not present

## 2023-01-17 DIAGNOSIS — R296 Repeated falls: Secondary | ICD-10-CM | POA: Diagnosis not present

## 2023-01-17 DIAGNOSIS — M62541 Muscle wasting and atrophy, not elsewhere classified, right hand: Secondary | ICD-10-CM | POA: Diagnosis not present

## 2023-01-17 DIAGNOSIS — R278 Other lack of coordination: Secondary | ICD-10-CM | POA: Diagnosis not present

## 2023-01-17 DIAGNOSIS — M62542 Muscle wasting and atrophy, not elsewhere classified, left hand: Secondary | ICD-10-CM | POA: Diagnosis not present

## 2023-01-22 DIAGNOSIS — R278 Other lack of coordination: Secondary | ICD-10-CM | POA: Diagnosis not present

## 2023-01-22 DIAGNOSIS — R296 Repeated falls: Secondary | ICD-10-CM | POA: Diagnosis not present

## 2023-01-22 DIAGNOSIS — M62542 Muscle wasting and atrophy, not elsewhere classified, left hand: Secondary | ICD-10-CM | POA: Diagnosis not present

## 2023-01-22 DIAGNOSIS — M62541 Muscle wasting and atrophy, not elsewhere classified, right hand: Secondary | ICD-10-CM | POA: Diagnosis not present

## 2023-01-29 DIAGNOSIS — M62541 Muscle wasting and atrophy, not elsewhere classified, right hand: Secondary | ICD-10-CM | POA: Diagnosis not present

## 2023-01-29 DIAGNOSIS — R296 Repeated falls: Secondary | ICD-10-CM | POA: Diagnosis not present

## 2023-01-29 DIAGNOSIS — M62542 Muscle wasting and atrophy, not elsewhere classified, left hand: Secondary | ICD-10-CM | POA: Diagnosis not present

## 2023-01-29 DIAGNOSIS — R278 Other lack of coordination: Secondary | ICD-10-CM | POA: Diagnosis not present

## 2023-02-04 DIAGNOSIS — R278 Other lack of coordination: Secondary | ICD-10-CM | POA: Diagnosis not present

## 2023-02-04 DIAGNOSIS — M62541 Muscle wasting and atrophy, not elsewhere classified, right hand: Secondary | ICD-10-CM | POA: Diagnosis not present

## 2023-02-04 DIAGNOSIS — R296 Repeated falls: Secondary | ICD-10-CM | POA: Diagnosis not present

## 2023-02-04 DIAGNOSIS — M62542 Muscle wasting and atrophy, not elsewhere classified, left hand: Secondary | ICD-10-CM | POA: Diagnosis not present

## 2023-02-07 DIAGNOSIS — M62542 Muscle wasting and atrophy, not elsewhere classified, left hand: Secondary | ICD-10-CM | POA: Diagnosis not present

## 2023-02-07 DIAGNOSIS — R278 Other lack of coordination: Secondary | ICD-10-CM | POA: Diagnosis not present

## 2023-02-07 DIAGNOSIS — M62541 Muscle wasting and atrophy, not elsewhere classified, right hand: Secondary | ICD-10-CM | POA: Diagnosis not present

## 2023-02-07 DIAGNOSIS — R296 Repeated falls: Secondary | ICD-10-CM | POA: Diagnosis not present

## 2023-02-14 DIAGNOSIS — R296 Repeated falls: Secondary | ICD-10-CM | POA: Diagnosis not present

## 2023-02-14 DIAGNOSIS — M62541 Muscle wasting and atrophy, not elsewhere classified, right hand: Secondary | ICD-10-CM | POA: Diagnosis not present

## 2023-02-14 DIAGNOSIS — M62542 Muscle wasting and atrophy, not elsewhere classified, left hand: Secondary | ICD-10-CM | POA: Diagnosis not present

## 2023-02-14 DIAGNOSIS — R278 Other lack of coordination: Secondary | ICD-10-CM | POA: Diagnosis not present

## 2023-02-18 DIAGNOSIS — R296 Repeated falls: Secondary | ICD-10-CM | POA: Diagnosis not present

## 2023-02-18 DIAGNOSIS — R278 Other lack of coordination: Secondary | ICD-10-CM | POA: Diagnosis not present

## 2023-02-18 DIAGNOSIS — M62542 Muscle wasting and atrophy, not elsewhere classified, left hand: Secondary | ICD-10-CM | POA: Diagnosis not present

## 2023-02-18 DIAGNOSIS — M62541 Muscle wasting and atrophy, not elsewhere classified, right hand: Secondary | ICD-10-CM | POA: Diagnosis not present

## 2023-02-26 DIAGNOSIS — R296 Repeated falls: Secondary | ICD-10-CM | POA: Diagnosis not present

## 2023-02-26 DIAGNOSIS — M62542 Muscle wasting and atrophy, not elsewhere classified, left hand: Secondary | ICD-10-CM | POA: Diagnosis not present

## 2023-02-26 DIAGNOSIS — R278 Other lack of coordination: Secondary | ICD-10-CM | POA: Diagnosis not present

## 2023-02-26 DIAGNOSIS — M62541 Muscle wasting and atrophy, not elsewhere classified, right hand: Secondary | ICD-10-CM | POA: Diagnosis not present

## 2023-03-06 DIAGNOSIS — M62542 Muscle wasting and atrophy, not elsewhere classified, left hand: Secondary | ICD-10-CM | POA: Diagnosis not present

## 2023-03-06 DIAGNOSIS — R296 Repeated falls: Secondary | ICD-10-CM | POA: Diagnosis not present

## 2023-03-06 DIAGNOSIS — R278 Other lack of coordination: Secondary | ICD-10-CM | POA: Diagnosis not present

## 2023-03-06 DIAGNOSIS — M62541 Muscle wasting and atrophy, not elsewhere classified, right hand: Secondary | ICD-10-CM | POA: Diagnosis not present

## 2023-03-13 DIAGNOSIS — R296 Repeated falls: Secondary | ICD-10-CM | POA: Diagnosis not present

## 2023-03-13 DIAGNOSIS — M62541 Muscle wasting and atrophy, not elsewhere classified, right hand: Secondary | ICD-10-CM | POA: Diagnosis not present

## 2023-03-13 DIAGNOSIS — M62542 Muscle wasting and atrophy, not elsewhere classified, left hand: Secondary | ICD-10-CM | POA: Diagnosis not present

## 2023-03-13 DIAGNOSIS — R278 Other lack of coordination: Secondary | ICD-10-CM | POA: Diagnosis not present

## 2023-03-20 DIAGNOSIS — M62542 Muscle wasting and atrophy, not elsewhere classified, left hand: Secondary | ICD-10-CM | POA: Diagnosis not present

## 2023-03-20 DIAGNOSIS — M62541 Muscle wasting and atrophy, not elsewhere classified, right hand: Secondary | ICD-10-CM | POA: Diagnosis not present

## 2023-03-20 DIAGNOSIS — R278 Other lack of coordination: Secondary | ICD-10-CM | POA: Diagnosis not present

## 2023-03-20 DIAGNOSIS — R296 Repeated falls: Secondary | ICD-10-CM | POA: Diagnosis not present

## 2023-06-26 DIAGNOSIS — E7800 Pure hypercholesterolemia, unspecified: Secondary | ICD-10-CM | POA: Diagnosis not present

## 2023-06-26 DIAGNOSIS — I10 Essential (primary) hypertension: Secondary | ICD-10-CM | POA: Diagnosis not present

## 2023-06-26 DIAGNOSIS — E1140 Type 2 diabetes mellitus with diabetic neuropathy, unspecified: Secondary | ICD-10-CM | POA: Diagnosis not present

## 2023-06-26 DIAGNOSIS — E039 Hypothyroidism, unspecified: Secondary | ICD-10-CM | POA: Diagnosis not present

## 2023-08-26 DIAGNOSIS — E7800 Pure hypercholesterolemia, unspecified: Secondary | ICD-10-CM | POA: Diagnosis not present

## 2023-08-26 DIAGNOSIS — E039 Hypothyroidism, unspecified: Secondary | ICD-10-CM | POA: Diagnosis not present

## 2023-08-26 DIAGNOSIS — I10 Essential (primary) hypertension: Secondary | ICD-10-CM | POA: Diagnosis not present

## 2023-08-26 DIAGNOSIS — E1140 Type 2 diabetes mellitus with diabetic neuropathy, unspecified: Secondary | ICD-10-CM | POA: Diagnosis not present

## 2023-09-23 DIAGNOSIS — E663 Overweight: Secondary | ICD-10-CM | POA: Diagnosis not present

## 2023-09-23 DIAGNOSIS — H9190 Unspecified hearing loss, unspecified ear: Secondary | ICD-10-CM | POA: Diagnosis not present

## 2023-09-23 DIAGNOSIS — R7303 Prediabetes: Secondary | ICD-10-CM | POA: Diagnosis not present

## 2023-09-23 DIAGNOSIS — H6121 Impacted cerumen, right ear: Secondary | ICD-10-CM | POA: Diagnosis not present

## 2023-09-23 DIAGNOSIS — Z6828 Body mass index (BMI) 28.0-28.9, adult: Secondary | ICD-10-CM | POA: Diagnosis not present

## 2023-09-23 DIAGNOSIS — Z0000 Encounter for general adult medical examination without abnormal findings: Secondary | ICD-10-CM | POA: Diagnosis not present

## 2023-09-23 DIAGNOSIS — F03B0 Unspecified dementia, moderate, without behavioral disturbance, psychotic disturbance, mood disturbance, and anxiety: Secondary | ICD-10-CM | POA: Diagnosis not present

## 2023-09-23 DIAGNOSIS — I10 Essential (primary) hypertension: Secondary | ICD-10-CM | POA: Diagnosis not present

## 2023-09-23 DIAGNOSIS — E039 Hypothyroidism, unspecified: Secondary | ICD-10-CM | POA: Diagnosis not present

## 2023-09-23 DIAGNOSIS — Z1331 Encounter for screening for depression: Secondary | ICD-10-CM | POA: Diagnosis not present

## 2023-09-26 DIAGNOSIS — I10 Essential (primary) hypertension: Secondary | ICD-10-CM | POA: Diagnosis not present

## 2023-09-26 DIAGNOSIS — E7800 Pure hypercholesterolemia, unspecified: Secondary | ICD-10-CM | POA: Diagnosis not present

## 2023-09-26 DIAGNOSIS — E1140 Type 2 diabetes mellitus with diabetic neuropathy, unspecified: Secondary | ICD-10-CM | POA: Diagnosis not present

## 2023-09-26 DIAGNOSIS — E039 Hypothyroidism, unspecified: Secondary | ICD-10-CM | POA: Diagnosis not present

## 2023-10-30 DIAGNOSIS — E039 Hypothyroidism, unspecified: Secondary | ICD-10-CM | POA: Diagnosis not present

## 2023-11-26 DIAGNOSIS — E7800 Pure hypercholesterolemia, unspecified: Secondary | ICD-10-CM | POA: Diagnosis not present

## 2023-11-26 DIAGNOSIS — E039 Hypothyroidism, unspecified: Secondary | ICD-10-CM | POA: Diagnosis not present

## 2023-11-26 DIAGNOSIS — I10 Essential (primary) hypertension: Secondary | ICD-10-CM | POA: Diagnosis not present

## 2023-11-26 DIAGNOSIS — E1140 Type 2 diabetes mellitus with diabetic neuropathy, unspecified: Secondary | ICD-10-CM | POA: Diagnosis not present

## 2023-12-25 DIAGNOSIS — E039 Hypothyroidism, unspecified: Secondary | ICD-10-CM | POA: Diagnosis not present

## 2023-12-27 DIAGNOSIS — E039 Hypothyroidism, unspecified: Secondary | ICD-10-CM | POA: Diagnosis not present

## 2023-12-27 DIAGNOSIS — E1140 Type 2 diabetes mellitus with diabetic neuropathy, unspecified: Secondary | ICD-10-CM | POA: Diagnosis not present

## 2023-12-27 DIAGNOSIS — I10 Essential (primary) hypertension: Secondary | ICD-10-CM | POA: Diagnosis not present

## 2023-12-27 DIAGNOSIS — E7800 Pure hypercholesterolemia, unspecified: Secondary | ICD-10-CM | POA: Diagnosis not present

## 2024-02-11 DIAGNOSIS — E119 Type 2 diabetes mellitus without complications: Secondary | ICD-10-CM | POA: Diagnosis not present

## 2024-02-11 DIAGNOSIS — Z6828 Body mass index (BMI) 28.0-28.9, adult: Secondary | ICD-10-CM | POA: Diagnosis not present

## 2024-02-11 DIAGNOSIS — Z23 Encounter for immunization: Secondary | ICD-10-CM | POA: Diagnosis not present

## 2024-02-11 DIAGNOSIS — F03B0 Unspecified dementia, moderate, without behavioral disturbance, psychotic disturbance, mood disturbance, and anxiety: Secondary | ICD-10-CM | POA: Diagnosis not present

## 2024-02-11 DIAGNOSIS — I129 Hypertensive chronic kidney disease with stage 1 through stage 4 chronic kidney disease, or unspecified chronic kidney disease: Secondary | ICD-10-CM | POA: Diagnosis not present

## 2024-02-11 DIAGNOSIS — N1831 Chronic kidney disease, stage 3a: Secondary | ICD-10-CM | POA: Diagnosis not present

## 2024-02-11 DIAGNOSIS — E039 Hypothyroidism, unspecified: Secondary | ICD-10-CM | POA: Diagnosis not present

## 2024-02-22 ENCOUNTER — Emergency Department (HOSPITAL_COMMUNITY)

## 2024-02-22 ENCOUNTER — Other Ambulatory Visit: Payer: Self-pay

## 2024-02-22 ENCOUNTER — Encounter (HOSPITAL_COMMUNITY): Payer: Self-pay | Admitting: *Deleted

## 2024-02-22 ENCOUNTER — Inpatient Hospital Stay (HOSPITAL_COMMUNITY)
Admission: EM | Admit: 2024-02-22 | Discharge: 2024-02-28 | DRG: 193 | Disposition: A | Discharge status: Home / Self Care | Source: Skilled Nursing Facility

## 2024-02-22 DIAGNOSIS — Z881 Allergy status to other antibiotic agents status: Secondary | ICD-10-CM

## 2024-02-22 DIAGNOSIS — N179 Acute kidney failure, unspecified: Secondary | ICD-10-CM | POA: Diagnosis present

## 2024-02-22 DIAGNOSIS — R0603 Acute respiratory distress: Secondary | ICD-10-CM

## 2024-02-22 DIAGNOSIS — Z66 Do not resuscitate: Secondary | ICD-10-CM | POA: Diagnosis present

## 2024-02-22 DIAGNOSIS — F03B0 Unspecified dementia, moderate, without behavioral disturbance, psychotic disturbance, mood disturbance, and anxiety: Secondary | ICD-10-CM | POA: Diagnosis present

## 2024-02-22 DIAGNOSIS — Z79890 Hormone replacement therapy (postmenopausal): Secondary | ICD-10-CM

## 2024-02-22 DIAGNOSIS — Z96652 Presence of left artificial knee joint: Secondary | ICD-10-CM | POA: Diagnosis present

## 2024-02-22 DIAGNOSIS — Z79899 Other long term (current) drug therapy: Secondary | ICD-10-CM

## 2024-02-22 DIAGNOSIS — N1831 Chronic kidney disease, stage 3a: Secondary | ICD-10-CM | POA: Diagnosis present

## 2024-02-22 DIAGNOSIS — E1165 Type 2 diabetes mellitus with hyperglycemia: Secondary | ICD-10-CM | POA: Diagnosis present

## 2024-02-22 DIAGNOSIS — E871 Hypo-osmolality and hyponatremia: Secondary | ICD-10-CM | POA: Diagnosis present

## 2024-02-22 DIAGNOSIS — E876 Hypokalemia: Secondary | ICD-10-CM | POA: Diagnosis present

## 2024-02-22 DIAGNOSIS — I2510 Atherosclerotic heart disease of native coronary artery without angina pectoris: Secondary | ICD-10-CM | POA: Diagnosis present

## 2024-02-22 DIAGNOSIS — Z8249 Family history of ischemic heart disease and other diseases of the circulatory system: Secondary | ICD-10-CM

## 2024-02-22 DIAGNOSIS — E7800 Pure hypercholesterolemia, unspecified: Secondary | ICD-10-CM | POA: Diagnosis present

## 2024-02-22 DIAGNOSIS — I959 Hypotension, unspecified: Secondary | ICD-10-CM | POA: Diagnosis not present

## 2024-02-22 DIAGNOSIS — I498 Other specified cardiac arrhythmias: Secondary | ICD-10-CM

## 2024-02-22 DIAGNOSIS — Z7984 Long term (current) use of oral hypoglycemic drugs: Secondary | ICD-10-CM

## 2024-02-22 DIAGNOSIS — E1122 Type 2 diabetes mellitus with diabetic chronic kidney disease: Secondary | ICD-10-CM | POA: Diagnosis present

## 2024-02-22 DIAGNOSIS — Z90710 Acquired absence of both cervix and uterus: Secondary | ICD-10-CM

## 2024-02-22 DIAGNOSIS — J101 Influenza due to other identified influenza virus with other respiratory manifestations: Principal | ICD-10-CM | POA: Diagnosis present

## 2024-02-22 DIAGNOSIS — G9341 Metabolic encephalopathy: Secondary | ICD-10-CM | POA: Diagnosis present

## 2024-02-22 DIAGNOSIS — R0902 Hypoxemia: Secondary | ICD-10-CM | POA: Diagnosis present

## 2024-02-22 DIAGNOSIS — Z882 Allergy status to sulfonamides status: Secondary | ICD-10-CM

## 2024-02-22 DIAGNOSIS — I429 Cardiomyopathy, unspecified: Secondary | ICD-10-CM | POA: Diagnosis present

## 2024-02-22 DIAGNOSIS — E860 Dehydration: Secondary | ICD-10-CM | POA: Diagnosis present

## 2024-02-22 DIAGNOSIS — N3000 Acute cystitis without hematuria: Secondary | ICD-10-CM | POA: Diagnosis present

## 2024-02-22 DIAGNOSIS — Z880 Allergy status to penicillin: Secondary | ICD-10-CM

## 2024-02-22 DIAGNOSIS — R4182 Altered mental status, unspecified: Principal | ICD-10-CM

## 2024-02-22 DIAGNOSIS — F05 Delirium due to known physiological condition: Secondary | ICD-10-CM | POA: Diagnosis not present

## 2024-02-22 DIAGNOSIS — E039 Hypothyroidism, unspecified: Secondary | ICD-10-CM | POA: Diagnosis present

## 2024-02-22 DIAGNOSIS — R001 Bradycardia, unspecified: Secondary | ICD-10-CM | POA: Diagnosis not present

## 2024-02-22 DIAGNOSIS — I129 Hypertensive chronic kidney disease with stage 1 through stage 4 chronic kidney disease, or unspecified chronic kidney disease: Secondary | ICD-10-CM | POA: Diagnosis present

## 2024-02-22 DIAGNOSIS — I441 Atrioventricular block, second degree: Secondary | ICD-10-CM | POA: Diagnosis present

## 2024-02-22 DIAGNOSIS — Z91013 Allergy to seafood: Secondary | ICD-10-CM

## 2024-02-22 LAB — COMPREHENSIVE METABOLIC PANEL WITH GFR
ALT: 7 U/L (ref 0–44)
AST: 35 U/L (ref 15–41)
Albumin: 4 g/dL (ref 3.5–5.0)
Alkaline Phosphatase: 57 U/L (ref 38–126)
Anion gap: 12 (ref 5–15)
BUN: 20 mg/dL (ref 8–23)
CO2: 20 mmol/L — ABNORMAL LOW (ref 22–32)
Calcium: 8.8 mg/dL — ABNORMAL LOW (ref 8.9–10.3)
Chloride: 100 mmol/L (ref 98–111)
Creatinine, Ser: 1.13 mg/dL — ABNORMAL HIGH (ref 0.44–1.00)
GFR, Estimated: 48 mL/min — ABNORMAL LOW
Glucose, Bld: 168 mg/dL — ABNORMAL HIGH (ref 70–99)
Potassium: 3.9 mmol/L (ref 3.5–5.1)
Sodium: 132 mmol/L — ABNORMAL LOW (ref 135–145)
Total Bilirubin: 0.6 mg/dL (ref 0.0–1.2)
Total Protein: 6.8 g/dL (ref 6.5–8.1)

## 2024-02-22 LAB — CBC
HCT: 41.1 % (ref 36.0–46.0)
Hemoglobin: 13.3 g/dL (ref 12.0–15.0)
MCH: 30.6 pg (ref 26.0–34.0)
MCHC: 32.4 g/dL (ref 30.0–36.0)
MCV: 94.5 fL (ref 80.0–100.0)
Platelets: 251 K/uL (ref 150–400)
RBC: 4.35 MIL/uL (ref 3.87–5.11)
RDW: 15 % (ref 11.5–15.5)
WBC: 7 K/uL (ref 4.0–10.5)
nRBC: 0 % (ref 0.0–0.2)

## 2024-02-22 LAB — CBG MONITORING, ED: Glucose-Capillary: 169 mg/dL — ABNORMAL HIGH (ref 70–99)

## 2024-02-22 LAB — RESP PANEL BY RT-PCR (RSV, FLU A&B, COVID)  RVPGX2
Influenza A by PCR: POSITIVE — AB
Influenza B by PCR: NEGATIVE
Resp Syncytial Virus by PCR: NEGATIVE
SARS Coronavirus 2 by RT PCR: NEGATIVE

## 2024-02-22 NOTE — ED Provider Notes (Addendum)
 " [place] [facility] AT [facility] Provider Note   CSN: [id number] Arrival date & time: 02/22/24  8046     Patient presents with: Altered Mental Status   Michele Dawson is a 83 y.o. female.   HPI   83 year old female with past medical history of dementia presents to the emergency department from nursing facility for concern of confusion.  Patient is a new resident there.  Was reportedly found in a different residence room, hard to arouse and then confused.  Prior to Admission medications  Medication Sig Start Date End Date Taking? Authorizing Provider  acetaminophen  (TYLENOL ) 500 MG tablet Take 500-1,000 mg by mouth daily as needed (pain or headaches).     [provider]  ALPRAZolam  (XANAX ) 1 MG tablet Take 1-2 tablets thirty minutes prior to MRI.  May take one additional tablet before entering scanner, if needed.  MUST HAVE DRIVER. 0/86/78   Onita Duos, MD  amoxicillin (AMOXIL) 500 MG capsule Take 2,000 mg by mouth See admin instructions. Take 2,000 mg by mouth one hour prior to dental appointments 11/10/18   [provider]  atorvastatin  (LIPITOR ) 80 MG tablet Take 80 mg by mouth daily.      [provider]  BLINK TEARS 0.25 % SOLN Place 1 drop into both eyes 3 (three) times daily as needed (dry/irritated eyes.).    [provider]  Cholecalciferol  (VITAMIN D -3) 125 MCG (5000 UT) TABS Take 5,000 Units by mouth daily.    [provider]  clobetasol cream (TEMOVATE) 0.05 % Apply 1 application topically daily as needed (vulva irritation.).  01/15/19   [provider]  Cyanocobalamin  (B-12) 5000 MCG CAPS Take 5,000 mcg by mouth daily.    [provider]  fluticasone  (FLONASE ) 50 MCG/ACT nasal spray Place 1-2 sprays into both nostrils daily as needed (for seasonal allergies).     [provider]  hydrochlorothiazide (MICROZIDE) 12.5 MG capsule Take 12.5 mg by mouth daily.    [provider]  levothyroxine  (SYNTHROID , LEVOTHROID) 125 MCG tablet Take 125 mcg by mouth daily before breakfast.     [provider]  lisinopril  (ZESTRIL ) 40 MG tablet Take 80 mg by mouth daily.     [provider]  loratadine (CLARITIN) 10 MG tablet Take 10 mg by mouth daily as needed for allergies.    [provider]  metFORMIN (GLUCOPHAGE) 500 MG tablet Take 500 mg by mouth daily with breakfast.     [provider]  polyethylene glycol (MIRALAX  / GLYCOLAX ) 17 g packet Take 17 g by mouth daily as needed (constipation.).    [provider]  Probiotic Product (PROBIOTIC PO) Take 1 capsule by mouth daily. Met[employer]ics UltraFlora IB    [provider]  simethicone (GAS-X EXTRA STRENGTH) 125 MG chewable tablet Chew 250 mg by mouth every 6 (six) hours as needed for flatulence.    [provider]    Allergies: Shellfish allergy, Sulfa antibiotics, and Penicillins    Review of Systems  Updated Vital Signs BP 98/78   Pulse 66   Temp (!) 95.4 F (35.2 C) (Temporal)   Resp 11   SpO2 99%   Physical Exam  (all labs ordered are listed, but only abnormal results are displayed) Labs Reviewed  RESP PANEL BY RT-PCR (RSV, FLU A&B, COVID)  RVPGX2 - Abnormal; Notable for the following components:      Result Value   Influenza A by PCR POSITIVE (*)    All  other components within normal limits  COMPREHENSIVE METABOLIC PANEL WITH GFR - Abnormal; Notable for the following components:   Sodium 132 (*)    CO2 20 (*)    Glucose, Bld 168 (*)    Creatinine, Ser 1.13 (*)    Calcium  8.8 (*)    GFR, Estimated 48 (*)    All other components within normal limits  CBG MONITORING, ED - Abnormal; Notable for the following components:   Glucose-Capillary 169 (*)    All other components within normal limits  CBC  URINALYSIS, ROUTINE W REFLEX MICROSCOPIC    EKG: EKG Interpretation Date/Time:  Saturday February 22 2024 20:02:59 EST Ventricular  Rate:  76 PR Interval:    QRS Duration:  105 QT Interval:  487 QTC Calculation: 499 R Axis:   -56  Text Interpretation: Atrial fibrillation LAD, consider left anterior fascicular block Nonspecific T abnrm, anterolateral leads Borderline prolonged QT interval Confirmed by Bari Flank [phone]) on 02/22/2024 9:32:03 PM  Radiology: [facility] Chest Port 1 View Result Date: 02/22/2024 EXAM: 1 VIEW(S) XRAY OF THE CHEST 02/22/2024 08:34:20 PM COMPARISON: Chest x-ray 01/13/2020, CT chest 07/06/2020. CLINICAL HISTORY: weak FINDINGS: LIMITATIONS/ARTIFACTS: The patient is rotated. LUNGS AND PLEURA: Low lung volumes. No focal pulmonary opacity. No pleural effusion. No pneumothorax. HEART AND MEDIASTINUM: Widening of the mediastinum due to rotation. BONES AND SOFT TISSUES: Bilateral shoulder degenerative changes. IMPRESSION: 1. No acute cardiopulmonary abnormality. Electronically signed by: Morgane Naveau MD 02/22/2024 09:23 PM EST RP Workstation: [id number]     Procedures   Medications Ordered in the ED - No data to display                                  Medical Decision Making Amount and/or Complexity of Data Reviewed Labs: ordered. Radiology: ordered.  Risk Decision regarding hospitalization.   83 year old female presents emergency department from facility for confusion.  Was found in a different residence room, hard to arouse.  She is alert on my exam, very confused but conversational.  Stable vitals.  Blood work is reassuring without any acute abnormalities.  Respiratory panel was positive for influenza A.  Still pending urinalysis.  Daughter is now at bedside.  She states over the last 2 days she has had a significant decline from a mental status standpoint.  Typically she will recognize them, be able to hold a conversation.  This was an acute change in the past 2 days with significant decline.  Given the change in mental status, encephalopathy most likely secondary to influenza we will  plan for admission.  Have ordered an In-N-Out for urinalysis as well as CT of the head for completion.  Patient is pending admission.     Final diagnoses:  None    ED Discharge Orders     None          Bari Flank HERO, DO 02/22/24 2349    Bari Flank HERO, DO 02/22/24 2350  "

## 2024-02-22 NOTE — ED Provider Notes (Signed)
 Blood pressure 99/67, pulse (!) 59, temperature (!) 95.4 F (35.2 C), temperature source Temporal, resp. rate 18, SpO2 99%.  Assuming care from Dr. Bari.  In short, Michele Dawson is a 82 y.o. female with a chief complaint of Altered Mental Status .  Refer to the original H&P for additional details.  The current plan of care is to f/u with admit team.  Discussed patient's case with [facility] to request admission. Patient and family (if present) updated with plan.   I reviewed all nursing notes, vitals, pertinent old records, EKGs, labs, imaging (as available).    Darra Fonda MATSU, MD 02/23/24 [phone]

## 2024-02-22 NOTE — ED Triage Notes (Signed)
 Pt from [facility]; new resident there as of 2 days ago. Was found in another residents room, altered. EMS found pt sitting in a chair, lethargic. Initial bp 72/44 with cyanosis around her lips; placed on 4L of oxygen  to get sats above 90%. 20g IV LAC given ~366ml NS   EMS CBG 210

## 2024-02-23 DIAGNOSIS — E7800 Pure hypercholesterolemia, unspecified: Secondary | ICD-10-CM | POA: Diagnosis present

## 2024-02-23 DIAGNOSIS — E1165 Type 2 diabetes mellitus with hyperglycemia: Secondary | ICD-10-CM | POA: Diagnosis present

## 2024-02-23 DIAGNOSIS — N3000 Acute cystitis without hematuria: Secondary | ICD-10-CM | POA: Diagnosis present

## 2024-02-23 DIAGNOSIS — E1122 Type 2 diabetes mellitus with diabetic chronic kidney disease: Secondary | ICD-10-CM | POA: Diagnosis present

## 2024-02-23 DIAGNOSIS — N179 Acute kidney failure, unspecified: Secondary | ICD-10-CM | POA: Diagnosis present

## 2024-02-23 DIAGNOSIS — R0902 Hypoxemia: Secondary | ICD-10-CM | POA: Diagnosis present

## 2024-02-23 DIAGNOSIS — J101 Influenza due to other identified influenza virus with other respiratory manifestations: Secondary | ICD-10-CM | POA: Diagnosis present

## 2024-02-23 DIAGNOSIS — I441 Atrioventricular block, second degree: Secondary | ICD-10-CM | POA: Diagnosis present

## 2024-02-23 DIAGNOSIS — E876 Hypokalemia: Secondary | ICD-10-CM | POA: Diagnosis present

## 2024-02-23 DIAGNOSIS — I429 Cardiomyopathy, unspecified: Secondary | ICD-10-CM | POA: Diagnosis present

## 2024-02-23 DIAGNOSIS — I2510 Atherosclerotic heart disease of native coronary artery without angina pectoris: Secondary | ICD-10-CM | POA: Diagnosis present

## 2024-02-23 DIAGNOSIS — F05 Delirium due to known physiological condition: Secondary | ICD-10-CM | POA: Diagnosis not present

## 2024-02-23 DIAGNOSIS — F03B0 Unspecified dementia, moderate, without behavioral disturbance, psychotic disturbance, mood disturbance, and anxiety: Secondary | ICD-10-CM | POA: Diagnosis present

## 2024-02-23 DIAGNOSIS — R9431 Abnormal electrocardiogram [ECG] [EKG]: Secondary | ICD-10-CM | POA: Diagnosis not present

## 2024-02-23 DIAGNOSIS — I129 Hypertensive chronic kidney disease with stage 1 through stage 4 chronic kidney disease, or unspecified chronic kidney disease: Secondary | ICD-10-CM | POA: Diagnosis present

## 2024-02-23 DIAGNOSIS — R001 Bradycardia, unspecified: Secondary | ICD-10-CM | POA: Diagnosis not present

## 2024-02-23 DIAGNOSIS — Z66 Do not resuscitate: Secondary | ICD-10-CM | POA: Diagnosis present

## 2024-02-23 DIAGNOSIS — E860 Dehydration: Secondary | ICD-10-CM | POA: Diagnosis present

## 2024-02-23 DIAGNOSIS — Z7984 Long term (current) use of oral hypoglycemic drugs: Secondary | ICD-10-CM | POA: Diagnosis not present

## 2024-02-23 DIAGNOSIS — G9341 Metabolic encephalopathy: Secondary | ICD-10-CM | POA: Diagnosis present

## 2024-02-23 DIAGNOSIS — I959 Hypotension, unspecified: Secondary | ICD-10-CM | POA: Diagnosis not present

## 2024-02-23 DIAGNOSIS — N1831 Chronic kidney disease, stage 3a: Secondary | ICD-10-CM | POA: Diagnosis present

## 2024-02-23 DIAGNOSIS — E039 Hypothyroidism, unspecified: Secondary | ICD-10-CM | POA: Diagnosis present

## 2024-02-23 DIAGNOSIS — E871 Hypo-osmolality and hyponatremia: Secondary | ICD-10-CM | POA: Diagnosis present

## 2024-02-23 LAB — I-STAT VENOUS BLOOD GAS, ED
Acid-base deficit: 2 mmol/L (ref 0.0–2.0)
Bicarbonate: 23.9 mmol/L (ref 20.0–28.0)
Calcium, Ion: 1.13 mmol/L — ABNORMAL LOW (ref 1.15–1.40)
HCT: 40 % (ref 36.0–46.0)
Hemoglobin: 13.6 g/dL (ref 12.0–15.0)
O2 Saturation: 44 %
Potassium: 3.7 mmol/L (ref 3.5–5.1)
Sodium: 137 mmol/L (ref 135–145)
TCO2: 25 mmol/L (ref 22–32)
pCO2, Ven: 43.6 mmHg — ABNORMAL LOW (ref 44–60)
pH, Ven: 7.348 (ref 7.25–7.43)
pO2, Ven: 26 mmHg — CL (ref 32–45)

## 2024-02-23 LAB — CBC
HCT: 39.5 % (ref 36.0–46.0)
Hemoglobin: 13 g/dL (ref 12.0–15.0)
MCH: 30.4 pg (ref 26.0–34.0)
MCHC: 32.9 g/dL (ref 30.0–36.0)
MCV: 92.3 fL (ref 80.0–100.0)
Platelets: 268 K/uL (ref 150–400)
RBC: 4.28 MIL/uL (ref 3.87–5.11)
RDW: 15.1 % (ref 11.5–15.5)
WBC: 8.3 K/uL (ref 4.0–10.5)
nRBC: 0 % (ref 0.0–0.2)

## 2024-02-23 LAB — URINALYSIS, ROUTINE W REFLEX MICROSCOPIC
Bilirubin Urine: NEGATIVE
Glucose, UA: NEGATIVE mg/dL
Hgb urine dipstick: NEGATIVE
Ketones, ur: 5 mg/dL — AB
Nitrite: NEGATIVE
Protein, ur: 100 mg/dL — AB
Specific Gravity, Urine: 1.023 (ref 1.005–1.030)
pH: 6 (ref 5.0–8.0)

## 2024-02-23 LAB — CBG MONITORING, ED
Glucose-Capillary: 109 mg/dL — ABNORMAL HIGH (ref 70–99)
Glucose-Capillary: 115 mg/dL — ABNORMAL HIGH (ref 70–99)
Glucose-Capillary: 101 mg/dL — ABNORMAL HIGH (ref 70–99)
Glucose-Capillary: 106 mg/dL — ABNORMAL HIGH (ref 70–99)

## 2024-02-23 LAB — HEMOGLOBIN A1C
Hgb A1c MFr Bld: 6.5 % — ABNORMAL HIGH (ref 4.8–5.6)
Mean Plasma Glucose: 139.85 mg/dL

## 2024-02-23 LAB — GLUCOSE, CAPILLARY: Glucose-Capillary: 97 mg/dL (ref 70–99)

## 2024-02-23 LAB — CREATININE, SERUM
Creatinine, Ser: 1.14 mg/dL — ABNORMAL HIGH (ref 0.44–1.00)
GFR, Estimated: 48 mL/min — ABNORMAL LOW

## 2024-02-23 MED ORDER — LISINOPRIL 20 MG PO TABS
20.0000 mg | ORAL_TABLET | Freq: Every day | ORAL | Status: DC
  Administered 2024-02-24: 20 mg via ORAL

## 2024-02-23 MED ORDER — LEVOTHYROXINE SODIUM 25 MCG PO TABS: 125.0000 ug | ORAL_TABLET | Freq: Every day | ORAL | Status: DC

## 2024-02-23 MED ORDER — GUAIFENESIN-DM 100-10 MG/5ML PO SYRP: 5.0000 mL | ORAL_SOLUTION | ORAL | Status: DC | PRN

## 2024-02-23 MED ORDER — OSELTAMIVIR PHOSPHATE 30 MG PO CAPS
30.0000 mg | ORAL_CAPSULE | Freq: Two times a day (BID) | ORAL | Status: AC
  Administered 2024-02-23 – 2024-02-27 (×8): 30 mg via ORAL
  Filled 2024-02-23 (×2): qty 1

## 2024-02-23 MED ORDER — SODIUM CHLORIDE 0.9 % IV SOLN: INTRAVENOUS | Status: AC

## 2024-02-23 MED ORDER — ACETAMINOPHEN 325 MG PO TABS: 650.0000 mg | ORAL_TABLET | Freq: Four times a day (QID) | ORAL | Status: DC | PRN

## 2024-02-23 MED ORDER — ATORVASTATIN CALCIUM 40 MG PO TABS: 80.0000 mg | ORAL_TABLET | Freq: Every day | ORAL | Status: DC

## 2024-02-23 MED ORDER — ONDANSETRON HCL 4 MG/2ML IJ SOLN: 4.0000 mg | Freq: Four times a day (QID) | INTRAMUSCULAR | Status: DC | PRN

## 2024-02-23 MED ORDER — IPRATROPIUM-ALBUTEROL 0.5-2.5 (3) MG/3ML IN SOLN
3.0000 mL | Freq: Two times a day (BID) | RESPIRATORY_TRACT | Status: DC
  Administered 2024-02-24: 3 mL via RESPIRATORY_TRACT

## 2024-02-23 MED ORDER — INSULIN ASPART 100 UNIT/ML IJ SOLN
0.0000 [IU] | Freq: Three times a day (TID) | INTRAMUSCULAR | Status: DC
  Administered 2024-02-26 – 2024-02-28 (×2): 1 [IU] via SUBCUTANEOUS

## 2024-02-23 MED ORDER — ENOXAPARIN SODIUM 40 MG/0.4ML IJ SOSY
40.0000 mg | PREFILLED_SYRINGE | Freq: Every day | INTRAMUSCULAR | Status: DC
  Administered 2024-02-24 – 2024-02-28 (×5): 40 mg via SUBCUTANEOUS
  Filled 2024-02-23: qty 0.4

## 2024-02-23 MED ORDER — IPRATROPIUM-ALBUTEROL 0.5-2.5 (3) MG/3ML IN SOLN
3.0000 mL | Freq: Four times a day (QID) | RESPIRATORY_TRACT | Status: DC
  Administered 2024-02-23 (×3): 3 mL via RESPIRATORY_TRACT
  Filled 2024-02-23 (×3): qty 3

## 2024-02-23 MED ORDER — INSULIN ASPART 100 UNIT/ML IJ SOLN: 0.0000 [IU] | Freq: Every day | INTRAMUSCULAR | Status: DC

## 2024-02-23 MED ORDER — LEVOTHYROXINE SODIUM 100 MCG PO TABS
300.0000 ug | ORAL_TABLET | Freq: Every day | ORAL | Status: DC
  Administered 2024-02-24 – 2024-02-28 (×5): 300 ug via ORAL

## 2024-02-23 MED ORDER — OSELTAMIVIR PHOSPHATE 75 MG PO CAPS
75.0000 mg | ORAL_CAPSULE | Freq: Once | ORAL | Status: AC
  Administered 2024-02-23: 75 mg via ORAL
  Filled 2024-02-23: qty 1

## 2024-02-23 MED ORDER — QUETIAPINE FUMARATE 25 MG PO TABS
25.0000 mg | ORAL_TABLET | Freq: Every day | ORAL | Status: DC
  Administered 2024-02-23 – 2024-02-27 (×5): 25 mg via ORAL
  Filled 2024-02-23: qty 1

## 2024-02-23 NOTE — Progress Notes (Signed)
 [facility] Progress Note  Patient: Michele Dawson    FMW:[id number]  DOA: 02/22/2024     Date of Service: the patient was seen and examined on 02/23/2024  Chief Complaint  Patient presents with   Altered Mental Status   Brief hospital course: Michele Dawson is a 83 y.o. female with medical history significant for hypertension, prediabetes, hyperlipidemia, hypothyroidism, who presents to the ER from SNF due to altered mental status for the last 2 days.  Associated with generalized weakness.  The patient was found in another resident's room.  Reportedly, she is new at the facility and has been there for 2 days.  EMS was activated.  Upon EMS arrival, the patient was hypotensive with SBP of 72 and DBP of 44.  Was noted to have cyanosis around her lips and was placed on 4 L nasal cannula to get her sats above 90%.  She received roughly 300 cc normal saline of IV fluid en route via EMS.   In the ER, alert and confused.  Influenza A positive.  Noncontrast head CT negative for any acute intracranial abnormality.  Chest x-ray was nonacute.  [facility], hospitalist service, was asked to admit.   ED Course: Temperature 97.1.  BP 98/73, pulse 59, respiration rate 19, O2 saturation 99% on 4 L nasal cannula.  Assessment and Plan:  Influenza A infection Tamiflu  75 mg twice daily x 5 days. DuoNebs every 6 hours for wheezing and shortness of breath As needed antitussives   Acute metabolic encephalopathy secondary to the above Continue to treat underlying conditions UA: LE moderate, nitrate negative, bacteria few, WBC 11-20 Reorient as needed Fall precautions.   Respiratory distress in the setting of influenza A infection Reportedly the patient was noted with cyanosis around her lips and was placed on 4 L nasal cannula to maintain her O2 saturation above 90%. Wean off O2 supplementation as tolerated Home O2 evaluation on 02/23/2028.   Hypovolemic hyponatremia Serum sodium 132 Hold off home  hydrochlorothiazide Gentle IV fluid hydration NS at 50 cc/h x 1 day. Encourage oral intake   AKI, prerenal in the setting of dehydration Baseline creatinine 0.77 with GFR greater than 60 Presented with creatinine 1.13 with GFR 48 IV fluid hydration NS at 50 cc/h x 1 day Encourage oral intake Monitor urine output Repeat BMP in the morning.   Hypothyroidism Resume home levothyroxine .   Hypertension Hold off home oral antihypertensive due to soft BPs Closely monitor vital signs.   Hyperlipidemia: off stain   Prediabetes with hyperglycemia Last hemoglobin A1c 5.7 in 2021 Obtain hemoglobin A1c Insulin  sliding scale with hyperglycemia   Generalized weakness PT OT evaluation Fall precaution   There is no height or weight on file to calculate BMI.  Interventions:  Diet: Heart healthy carb modified DVT Prophylaxis: Subcutaneous Lovenox    Advance goals of care discussion: DNR-limited  Family Communication: family was present at bedside, at the time of interview.  The pt provided permission to discuss medical plan with the family. Opportunity was given to ask question and all questions were answered satisfactorily.   Disposition:  Pt is from ALF, admitted with AMS, influenza A, hypoxia, still has hypoxia and altered mental status, which precludes a safe discharge. Discharge to ALF, when stable, most likely in 1 to 2 days.  Subjective: No significant events overnight, patient still remained confused, AO x 1, denied any complaints.  Physical Exam: General: NAD, lying comfortably Appear in no distress, affect appropriate Eyes: PERRLA ENT: Oral Mucosa Clear, moist  Neck: no JVD,  Cardiovascular: S1 and S2 Present, no Murmur,  Respiratory: good respiratory effort, Bilateral Air entry equal and Decreased, no Crackles, no wheezes Abdomen: Bowel Sound present, Soft and no tenderness,  Skin: no rashes Extremities: no Pedal edema, no calf tenderness Neurologic: without any new  focal findings Gait not checked due to patient safety concerns  Vitals:   02/23/24 0200 02/23/24 0430 02/23/24 0500 02/23/24 0502  BP: 109/75 104/76 112/69   Pulse: (!) 59 (!) 56 62   Resp: 13 11    Temp:    97.6 F (36.4 C)  TempSrc:    Oral  SpO2: 94% 96% 97%    No intake or output data in the 24 hours ending 02/23/24 0817 There were no vitals filed for this visit.  Data Reviewed: I have personally reviewed and interpreted daily labs, tele strips, imagings as discussed above. I reviewed all nursing notes, pharmacy notes, vitals, pertinent old records I have discussed plan of care as described above with RN and patient/family.  CBC: Recent Labs  Lab 02/22/24 2002 02/23/24 0145 02/23/24 0151  WBC 7.0 8.3  --   HGB 13.3 13.0 13.6  HCT 41.1 39.5 40.0  MCV 94.5 92.3  --   PLT 251 268  --    Basic Metabolic Panel: Recent Labs  Lab 02/22/24 2002 02/23/24 0145 02/23/24 0151  NA 132*  --  137  K 3.9  --  3.7  CL 100  --   --   CO2 20*  --   --   GLUCOSE 168*  --   --   BUN 20  --   --   CREATININE 1.13* 1.14*  --   CALCIUM  8.8*  --   --     Studies: CT Head Wo Contrast Result Date: 02/23/2024 EXAM: CT HEAD WITHOUT CONTRAST 02/22/2024 11:33:25 PM TECHNIQUE: CT of the head was performed without the administration of intravenous contrast. Automated exposure control, iterative reconstruction, and/or weight based adjustment of the mA/kV was utilized to reduce the radiation dose to as low as reasonably achievable. COMPARISON: None available. CLINICAL HISTORY: Mental status change, unknown cause FINDINGS: BRAIN AND VENTRICLES: No acute hemorrhage. No evidence of acute infarct. No hydrocephalus. No extra-axial collection. No mass effect or midline shift. Age-related atrophy. Periventricular and deep white matter hypodensity consistent with chronic small vessel ischemia. ORBITS: No acute abnormality. SINUSES: Mild mucosal thickening of ethmoid air cells. Fluid opacification of  lower left mastoid air cells without superimposed osseous erosion. SOFT TISSUES AND SKULL: No acute soft tissue abnormality. No skull fracture. Atherosclerosis of skullbase vasculature without hyperdense vessel or abnormal calcification. IMPRESSION: 1. No acute intracranial abnormality. 2. Age related atrophy and chronic small vessel ischemic changes . Electronically signed by: Dorethia Molt MD 02/23/2024 12:23 AM EST RP Workstation: [id number]   DG Chest Port 1 View Result Date: 02/22/2024 EXAM: 1 VIEW(S) XRAY OF THE CHEST 02/22/2024 08:34:20 PM COMPARISON: Chest x-ray 01/13/2020, CT chest 07/06/2020. CLINICAL HISTORY: weak FINDINGS: LIMITATIONS/ARTIFACTS: The patient is rotated. LUNGS AND PLEURA: Low lung volumes. No focal pulmonary opacity. No pleural effusion. No pneumothorax. HEART AND MEDIASTINUM: Widening of the mediastinum due to rotation. BONES AND SOFT TISSUES: Bilateral shoulder degenerative changes. IMPRESSION: 1. No acute cardiopulmonary abnormality. Electronically signed by: Morgane Naveau MD 02/22/2024 09:23 PM EST RP Workstation: [id number]    Scheduled Meds:  atorvastatin   80 mg Oral Daily   enoxaparin  (LOVENOX ) injection  40 mg Subcutaneous Daily   insulin  aspart  0-5 Units Subcutaneous QHS  insulin  aspart  0-9 Units Subcutaneous TID WC   ipratropium-albuterol   3 mL Nebulization Q6H   levothyroxine   125 mcg Oral QAC breakfast   oseltamivir   30 mg Oral BID   Continuous Infusions:  sodium chloride  50 mL/hr at 02/23/24 0143   PRN Meds: acetaminophen , guaiFENesin -dextromethorphan, ondansetron  (ZOFRAN ) IV  Time spent: 35 minutes  Author: ELVAN SOR. MD [facility] 02/23/2024 8:17 AM  To reach On-call, see care teams to locate the attending and reach out to them via [email or web address]. If 7PM-7AM, please contact night-coverage If you still have difficulty reaching the attending provider, please page the [facility] (Director on Call) for [facility] on amion for  assistance.

## 2024-02-23 NOTE — ED Notes (Addendum)
 Patient redressed and gotten situated back into bed after finding patient naked on the side of the bed with monitoring devices off. Patient previously had door blinds open so she could be seen, patient now has bed alarm in place. Patient removed 2nd IV.

## 2024-02-23 NOTE — ED Notes (Signed)
 CCMD called.

## 2024-02-23 NOTE — ED Notes (Signed)
 Pt unable to urinate at this time for urine sample, pt made aware sample needed.

## 2024-02-23 NOTE — ED Notes (Signed)
 This RN looked up from nursing station and patient was standing at patient room door naked. Patient had taken all monitoring devices and clothes off. Patient also removed 1 IV.

## 2024-02-23 NOTE — H&P (Addendum)
 " History and Physical  Michele Dawson FMW:[id number] DOB: 13-Nov-1940 DOA: 02/22/2024  Referring physician: Dr. Darra Dawson  PCP: Michele Planas, MD  Outpatient Specialists: None Patient coming from: SNF  Chief Complaint: Altered mental status.  HPI: Michele Dawson is a 83 y.o. female with medical history significant for hypertension, prediabetes, hyperlipidemia, hypothyroidism, who presents to the ER from SNF due to altered mental status for the last 2 days.  Associated with generalized weakness.  The patient was found in another resident's room.  Reportedly, she is new at the facility and has been there for 2 days.  EMS was activated.  Upon EMS arrival, the patient was hypotensive with SBP of 72 and DBP of 44.  Was noted to have cyanosis around her lips and was placed on 4 L nasal cannula to get her sats above 90%.  She received roughly 300 cc normal saline of IV fluid en route via EMS.  In the ER, alert and confused.  Influenza A positive.  Noncontrast head CT negative for any acute intracranial abnormality.  Chest x-ray was nonacute.  TRH, hospitalist service, was asked to admit.  ED Course: Temperature 97.1.  BP 98/73, pulse 59, respiration rate 19, O2 saturation 99% on 4 L nasal cannula.  Review of Systems: Review of systems as noted in the HPI. All other systems reviewed and are negative.   Past Medical History:  Diagnosis Date   Arthritis    Diabetes mellitus    Type II   Diverticulosis    Dyspnea    climbing stairs   Family history of malignant neoplasm of gastrointestinal tract    GERD (gastroesophageal reflux disease)    Maalox prn   Hyperlipidemia    Hypertension    Memory loss    Rectal bleeding 11/12/2018   Past Surgical History:  Procedure Laterality Date   BREAST BIOPSY Right    2017-2018   CARPAL TUNNEL RELEASE Left    left   COLONOSCOPY     DIAGNOSTIC LAPAROSCOPY  10/27/2018   KNEE ARTHROSCOPY Left    LAPAROSCOPY N/A 10/27/2018   Procedure: DIAGNOSTIC  LAPAROSCOPCY;  Surgeon: Vernetta Berg, MD;  Location: [facility] OR;  Service: General;  Laterality: N/A;   NASAL SEPTUM SURGERY     TOTAL KNEE ARTHROPLASTY Left 2009   left   TOTAL KNEE REVISION Left 06/10/2019   Procedure: Left knee polyethylene exchange;  Surgeon: Melodi Lerner, MD;  Location: [facility];  Service: Orthopedics;  Laterality: Left;    VAGINAL HYSTERECTOMY     with anterior and posterior vaginal repair    Social History:  reports that she has never smoked. She has never used smokeless tobacco. She reports current alcohol  use. She reports that she does not use drugs.   Allergies[1]  Family History  Problem Relation Age of Onset   Colon cancer Sister    Breast cancer Mother    Tongue cancer Father    Heart disease Father    Breast cancer Other        aunt x 2      Prior to Admission medications  Medication Sig Start Date End Date Taking? Authorizing Provider  acetaminophen  (TYLENOL ) 500 MG tablet Take 500-1,000 mg by mouth daily as needed (pain or headaches).     [provider]  ALPRAZolam  (XANAX ) 1 MG tablet Take 1-2 tablets thirty minutes prior to MRI.  May take one additional tablet before entering scanner, if needed.  MUST HAVE DRIVER. 0/86/78   Onita Duos,  MD  amoxicillin (AMOXIL) 500 MG capsule Take 2,000 mg by mouth See admin instructions. Take 2,000 mg by mouth one hour prior to dental appointments 11/10/18   [provider]  atorvastatin  (LIPITOR ) 80 MG tablet Take 80 mg by mouth daily.      [provider]  BLINK TEARS 0.25 % SOLN Place 1 drop into both eyes 3 (three) times daily as needed (dry/irritated eyes.).    [provider]  Cholecalciferol  (VITAMIN D -3) 125 MCG (5000 UT) TABS Take 5,000 Units by mouth daily.    [provider]  clobetasol cream (TEMOVATE) 0.05 % Apply 1 application topically daily as needed (vulva irritation.).  01/15/19   [provider]  Cyanocobalamin  (B-12) 5000 MCG CAPS Take  5,000 mcg by mouth daily.    [provider]  fluticasone  (FLONASE ) 50 MCG/ACT nasal spray Place 1-2 sprays into both nostrils daily as needed (for seasonal allergies).     [provider]  hydrochlorothiazide (MICROZIDE) 12.5 MG capsule Take 12.5 mg by mouth daily.    [provider]  levothyroxine  (SYNTHROID , LEVOTHROID) 125 MCG tablet Take 125 mcg by mouth daily before breakfast.     [provider]  lisinopril  (ZESTRIL ) 40 MG tablet Take 80 mg by mouth daily.     [provider]  loratadine (CLARITIN) 10 MG tablet Take 10 mg by mouth daily as needed for allergies.    [provider]  metFORMIN (GLUCOPHAGE) 500 MG tablet Take 500 mg by mouth daily with breakfast.     [provider]  polyethylene glycol (MIRALAX  / GLYCOLAX ) 17 g packet Take 17 g by mouth daily as needed (constipation.).    [provider]  Probiotic Product (PROBIOTIC PO) Take 1 capsule by mouth daily. Met[employer]ics UltraFlora IB    [provider]  simethicone (GAS-X EXTRA STRENGTH) 125 MG chewable tablet Chew 250 mg by mouth every 6 (six) hours as needed for flatulence.    [provider]    Physical Exam: BP 99/67   Pulse (!) 59   Temp (!) 95.4 F (35.2 C) (Temporal)   Resp 18   SpO2 99%   General: 83 y.o. year-old female well developed well nourished in no acute distress.  Alert and confused in the setting of dementia. Cardiovascular: Regular rate and rhythm with no rubs or gallops.  No thyromegaly or JVD noted.  No lower extremity edema. 2/4 pulses in all 4 extremities. Respiratory: Clear to auscultation with no wheezes or rales. Good inspiratory effort. Abdomen: Soft nontender nondistended with normal bowel sounds x4 quadrants. Muskuloskeletal: No cyanosis, clubbing or edema noted bilaterally Neuro: CN II-XII intact, strength, sensation, reflexes Skin: No ulcerative lesions noted or rashes Psychiatry: Judgement and insight  appear altered. Mood is appropriate for condition and setting          Labs on Admission:  Basic Metabolic Panel: Recent Labs  Lab 02/22/24 2002  NA 132*  K 3.9  CL 100  CO2 20*  GLUCOSE 168*  BUN 20  CREATININE 1.13*  CALCIUM  8.8*   Liver Function Tests: Recent Labs  Lab 02/22/24 2002  AST 35  ALT 7  ALKPHOS 57  BILITOT 0.6  PROT 6.8  ALBUMIN 4.0   No results for input(s): LIPASE, AMYLASE in the last 168 hours. No results for input(s): AMMONIA in the last 168 hours. CBC: Recent Labs  Lab 02/22/24 2002  WBC 7.0  HGB 13.3  HCT 41.1  MCV 94.5  PLT 251  Cardiac Enzymes: No results for input(s): CKTOTAL, CKMB, CKMBINDEX, TROPONINI in the last 168 hours.  BNP (last 3 results) No results for input(s): BNP in the last 8760 hours.  ProBNP (last 3 results) No results for input(s): PROBNP in the last 8760 hours.  CBG: Recent Labs  Lab 02/22/24 2036  GLUCAP 169*    Radiological Exams on Admission: CT Head Wo Contrast Result Date: 02/23/2024 EXAM: CT HEAD WITHOUT CONTRAST 02/22/2024 11:33:25 PM TECHNIQUE: CT of the head was performed without the administration of intravenous contrast. Automated exposure control, iterative reconstruction, and/or weight based adjustment of the mA/kV was utilized to reduce the radiation dose to as low as reasonably achievable. COMPARISON: None available. CLINICAL HISTORY: Mental status change, unknown cause FINDINGS: BRAIN AND VENTRICLES: No acute hemorrhage. No evidence of acute infarct. No hydrocephalus. No extra-axial collection. No mass effect or midline shift. Age-related atrophy. Periventricular and deep white matter hypodensity consistent with chronic small vessel ischemia. ORBITS: No acute abnormality. SINUSES: Mild mucosal thickening of ethmoid air cells. Fluid opacification of lower left mastoid air cells without superimposed osseous erosion. SOFT TISSUES AND SKULL: No acute soft tissue abnormality. No skull  fracture. Atherosclerosis of skullbase vasculature without hyperdense vessel or abnormal calcification. IMPRESSION: 1. No acute intracranial abnormality. 2. Age related atrophy and chronic small vessel ischemic changes . Electronically signed by: Dorethia Molt MD 02/23/2024 12:23 AM EST RP Workstation: [id number]   DG Chest Port 1 View Result Date: 02/22/2024 EXAM: 1 VIEW(S) XRAY OF THE CHEST 02/22/2024 08:34:20 PM COMPARISON: Chest x-ray 01/13/2020, CT chest 07/06/2020. CLINICAL HISTORY: weak FINDINGS: LIMITATIONS/ARTIFACTS: The patient is rotated. LUNGS AND PLEURA: Low lung volumes. No focal pulmonary opacity. No pleural effusion. No pneumothorax. HEART AND MEDIASTINUM: Widening of the mediastinum due to rotation. BONES AND SOFT TISSUES: Bilateral shoulder degenerative changes. IMPRESSION: 1. No acute cardiopulmonary abnormality. Electronically signed by: Morgane Naveau MD 02/22/2024 09:23 PM EST RP Workstation: [id number]    EKG: I independently viewed the EKG done and my findings are as followed: Sinus rhythm rate of 76.  PVCs.  QTc 499.  Assessment/Plan Present on Admission:  Influenza A  Principal Problem:   Influenza A  Influenza A infection Tamiflu  75 mg twice daily x 5 days. DuoNebs every 6 hours for wheezing and shortness of breath As needed antitussives  Acute metabolic encephalopathy secondary to the above Continue to treat underlying conditions Follow UA to rule out UTI Reorient as needed Fall precautions.  Respiratory distress in the setting of influenza A infection Reportedly the patient was noted with cyanosis around her lips and was placed on 4 L nasal cannula to maintain her O2 saturation above 90%. Wean off O2 supplementation as tolerated Home O2 evaluation on 02/23/2028.  Hypovolemic hyponatremia Serum sodium 132 Hold off home hydrochlorothiazide Gentle IV fluid hydration NS at 50 cc/h x 1 day. Encourage oral intake  AKI, prerenal in the setting of  dehydration Baseline creatinine 0.77 with GFR greater than 60 Presented with creatinine 1.13 with GFR 48 IV fluid hydration NS at 50 cc/h x 1 day Encourage oral intake Monitor urine output Repeat BMP in the morning.  Hypothyroidism Resume home levothyroxine .  Hypertension Hold off home oral antihypertensive due to soft BPs Closely monitor vital signs.  Hyperlipidemia Resume home statin  Prediabetes with hyperglycemia Last hemoglobin A1c 5.7 in 2021 Obtain hemoglobin A1c Insulin  sliding scale with hyperglycemia  Generalized weakness PT OT evaluation Fall precaution    Time: 75 minutes.   DVT prophylaxis: Subcu Lovenox  daily.  Code Status:  Full code.  Family Communication: None at bedside.  Disposition Plan: Admitted to telemetry unit.  Consults called: None.  Admission status: Observation status.   Status is: Observation    Terry LOISE Hurst MD [facility] Pager [phone]  If 7PM-7AM, please contact night-coverage [email or web address] Password [email or web address]  02/23/2024, 12:45 AM      [1]  Allergies Allergen Reactions   Shellfish Allergy Hives   Sulfa Antibiotics Hives    blotches   Penicillins Rash    Did it involve swelling of the face/tongue/throat, SOB, or low BP? Yes Did it involve sudden or severe rash/hives, skin peeling, or any reaction on the inside of your mouth or nose? Unk Did you need to seek medical attention at a hospital or doctor's office? Unk When did it last happen? 25 years ago If all above answers are NO, may proceed with cephalosporin use.     "

## 2024-02-24 ENCOUNTER — Encounter (HOSPITAL_COMMUNITY): Payer: Self-pay | Admitting: Internal Medicine

## 2024-02-24 DIAGNOSIS — J101 Influenza due to other identified influenza virus with other respiratory manifestations: Secondary | ICD-10-CM | POA: Diagnosis not present

## 2024-02-24 LAB — PHOSPHORUS: Phosphorus: 1.7 mg/dL — ABNORMAL LOW (ref 2.5–4.6)

## 2024-02-24 LAB — CBC
HCT: 35.3 % — ABNORMAL LOW (ref 36.0–46.0)
Hemoglobin: 11.8 g/dL — ABNORMAL LOW (ref 12.0–15.0)
MCH: 30.3 pg (ref 26.0–34.0)
MCHC: 33.4 g/dL (ref 30.0–36.0)
MCV: 90.7 fL (ref 80.0–100.0)
Platelets: 263 K/uL (ref 150–400)
RBC: 3.89 MIL/uL (ref 3.87–5.11)
RDW: 15.1 % (ref 11.5–15.5)
WBC: 5.9 K/uL (ref 4.0–10.5)
nRBC: 0 % (ref 0.0–0.2)

## 2024-02-24 LAB — BASIC METABOLIC PANEL WITH GFR
Anion gap: 12 (ref 5–15)
BUN: 16 mg/dL (ref 8–23)
CO2: 22 mmol/L (ref 22–32)
Calcium: 8.9 mg/dL (ref 8.9–10.3)
Chloride: 101 mmol/L (ref 98–111)
Creatinine, Ser: 0.89 mg/dL (ref 0.44–1.00)
GFR, Estimated: >60 mL/min
Glucose, Bld: 105 mg/dL — ABNORMAL HIGH (ref 70–99)
Potassium: 3.4 mmol/L — ABNORMAL LOW (ref 3.5–5.1)
Sodium: 135 mmol/L (ref 135–145)

## 2024-02-24 LAB — GLUCOSE, CAPILLARY
Glucose-Capillary: 97 mg/dL (ref 70–99)
Glucose-Capillary: 102 mg/dL — ABNORMAL HIGH (ref 70–99)
Glucose-Capillary: 104 mg/dL — ABNORMAL HIGH (ref 70–99)
Glucose-Capillary: 112 mg/dL — ABNORMAL HIGH (ref 70–99)

## 2024-02-24 LAB — MAGNESIUM: Magnesium: 2.1 mg/dL (ref 1.7–2.4)

## 2024-02-24 LAB — MRSA NEXT GEN BY PCR, NASAL: MRSA by PCR Next Gen: NOT DETECTED

## 2024-02-24 MED ORDER — IPRATROPIUM-ALBUTEROL 0.5-2.5 (3) MG/3ML IN SOLN: 3.0000 mL | Freq: Four times a day (QID) | RESPIRATORY_TRACT | Status: DC | PRN

## 2024-02-24 MED ORDER — SODIUM CHLORIDE 0.9 % IV SOLN: INTRAVENOUS | Status: AC

## 2024-02-24 MED ORDER — NITROFURANTOIN MONOHYD MACRO 100 MG PO CAPS
100.0000 mg | ORAL_CAPSULE | Freq: Two times a day (BID) | ORAL | Status: DC
  Administered 2024-02-24 – 2024-02-28 (×8): 100 mg via ORAL
  Filled 2024-02-24 (×9): qty 1

## 2024-02-24 MED ORDER — POTASSIUM PHOSPHATES 15 MMOLE/5ML IV SOLN
30.0000 mmol | Freq: Once | INTRAVENOUS | Status: AC
  Administered 2024-02-24: 30 mmol via INTRAVENOUS
  Filled 2024-02-24: qty 10

## 2024-02-24 MED ORDER — ORAL CARE MOUTH RINSE: 15.0000 mL | OROMUCOSAL | Status: DC | PRN

## 2024-02-24 NOTE — NC FL2 (Signed)
 " [place]  MEDICAID FL2 LEVEL OF CARE FORM     IDENTIFICATION  Patient Name: Michele Dawson Birthdate: 1940-10-09 Sex: female Admission Date (Current Location): 02/22/2024  [facility] and [place] Number:  Producer, Television/film/video and Address:  [facility], [address], [place], [place] [address]      Provider Number: [id number]  Attending Physician Name and Address:  Von Bellis, MD  Relative Name and Phone Number:  Michele Dawson  Daughter,   [phone]    Current Level of Care: Hospital Recommended Level of Care: Assisted Living Facility Prior Approval Number:    Date Approved/Denied:   PASRR Number: [id number]  Discharge Plan: Other (Comment) (ALF)    Current Diagnoses: Patient Active Problem List   Diagnosis Date Noted   Influenza A 02/23/2024   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/2019   History of total knee arthroplasty, left 06/10/2019   Rectal bleeding 11/12/2018   Hypothyroidism 11/12/2018   Toe fracture, left 5th toe 11/12/2018   Diabetes mellitus without complication (HCC) 11/12/2018   Anemia due to acute blood loss 11/12/2018   HTN (hypertension) 11/12/2018   Closed nondisplaced fracture of fifth left metatarsal bone    GERD (gastroesophageal reflux disease)    Small bowel intussusception (HCC) 10/27/2018   Disorder of thyroid  04/22/2007   DM 04/22/2007   HLD (hyperlipidemia) 04/22/2007   Sinusitis, chronic 04/22/2007   Diverticulosis of colon 04/22/2007   Arthropathy 04/22/2007   ELEVATED BLOOD PRESSURE 04/22/2007    Orientation RESPIRATION BLADDER Height & Weight     Self  Normal Incontinent Weight:   Height:     BEHAVIORAL SYMPTOMS/MOOD NEUROLOGICAL BOWEL NUTRITION STATUS      Incontinent Diet (see dc summary)  AMBULATORY STATUS COMMUNICATION OF NEEDS Skin     Verbally Other (Comment) (Ecchymosis)                       Personal Care Assistance Level of Assistance   (See DC summary)           Functional Limitations Info   Sight Sight Info: Impaired        SPECIAL CARE FACTORS FREQUENCY                       Contractures Contractures Info: Not present    Additional Factors Info  Code Status, Allergies Code Status Info: DNR Allergies Info: Shellfish Allergy  Sulfa Antibiotics  Penicillins           Current Medications (02/24/2024):  This is the current hospital active medication list Current Facility-Administered Medications  Medication Dose Route Frequency Provider Last Rate Last Admin   0.9 %  sodium chloride  infusion   Intravenous Continuous Von Bellis, MD 50 mL/hr at 02/24/24 0050 New Bag at 02/24/24 0050   acetaminophen  (TYLENOL ) tablet 650 mg  650 mg Oral Q6H PRN Von Bellis, MD       enoxaparin  (LOVENOX ) injection 40 mg  40 mg Subcutaneous Daily Shona Laurence N, DO   40 mg at 02/24/24 1000   guaiFENesin -dextromethorphan (ROBITUSSIN DM) 100-10 MG/5ML syrup 5 mL  5 mL Oral Q4H PRN Hall, Carole N, DO       insulin  aspart (novoLOG ) injection 0-5 Units  0-5 Units Subcutaneous QHS Hall, Carole N, DO       insulin  aspart (novoLOG ) injection 0-9 Units  0-9 Units Subcutaneous TID WC Hall, Carole N, DO       ipratropium-albuterol  (DUONEB) 0.5-2.5 (3) MG/3ML nebulizer  solution 3 mL  3 mL Nebulization BID Von Bellis, MD   3 mL at 02/24/24 9178   levothyroxine  (SYNTHROID ) tablet 300 mcg  300 mcg Oral Q0600 Von Bellis, MD   300 mcg at 02/24/24 0600   ondansetron  (ZOFRAN ) injection 4 mg  4 mg Intravenous Q6H PRN Von Bellis, MD       Oral care mouth rinse  15 mL Mouth Rinse PRN Von Bellis, MD       oseltamivir  (TAMIFLU ) capsule 30 mg  30 mg Oral BID Hall, Carole N, DO   30 mg at 02/24/24 1000   potassium PHOSPHATE  30 mmol in dextrose  5 % 500 mL infusion  30 mmol Intravenous Once Von Bellis, MD       QUEtiapine  (SEROQUEL ) tablet 25 mg  25 mg Oral QHS Von Bellis, MD   25 mg at 02/23/24 2342     Discharge Medications: Please see discharge summary for a list of discharge  medications.  Relevant Imaging Results:  Relevant Lab Results:   Additional Information SSN: [id number]  Michele Dawson, LCSWA     "

## 2024-02-24 NOTE — Plan of Care (Signed)

## 2024-02-24 NOTE — Progress Notes (Signed)
 [facility] Progress Note  Patient: Michele Dawson    FMW:[id number]  DOA: 02/22/2024     Date of Service: the patient was seen and examined on 02/24/2024  Chief Complaint  Patient presents with   Altered Mental Status   Brief hospital course: Michele Dawson is a 83 y.o. female with medical history significant for hypertension, prediabetes, hyperlipidemia, hypothyroidism, who presents to the ER from SNF due to altered mental status for the last 2 days.  Associated with generalized weakness.  The patient was found in another resident's room.  Reportedly, she is new at the facility and has been there for 2 days.  EMS was activated.  Upon EMS arrival, the patient was hypotensive with SBP of 72 and DBP of 44.  Was noted to have cyanosis around her lips and was placed on 4 L nasal cannula to get her sats above 90%.  She received roughly 300 cc normal saline of IV fluid en route via EMS.   In the ER, alert and confused.  Influenza A positive.  Noncontrast head CT negative for any acute intracranial abnormality.  Chest x-ray was nonacute.  TRH, hospitalist service, was asked to admit.   ED Course: Temperature 97.1.  BP 98/73, pulse 59, respiration rate 19, O2 saturation 99% on 4 L nasal cannula.  Assessment and Plan:  Influenza A infection Tamiflu  75 mg twice daily x 5 days. DuoNebs every 6 hours for wheezing and shortness of breath As needed antitussives   Acute metabolic encephalopathy secondary to the above Continue to treat underlying conditions UA: LE moderate, nitrate negative, bacteria few, WBC 11-20 Reorient as needed Fall precautions. 12/29 c/o dysuria, started Macrobid  100 mg p.o. twice daily for 5 days   Respiratory distress in the setting of influenza A infection Reportedly the patient was noted with cyanosis around her lips and was placed on 4 L nasal cannula to maintain her O2 saturation above 90%. 12/29 respiratory failure resolved, currently saturating well on  room air   Hypovolemic hyponatremia: Resolved Serum sodium 132>135 Hold off home hydrochlorothiazide Gentle IV fluid hydration NS at 50 cc/h x 1 day. Encourage oral intake   AKI, prerenal in the setting of dehydration Baseline creatinine 0.77 with GFR greater than 60 Presented with creatinine 1.13 with GFR 48 IV fluid hydration NS at 50 cc/h x 1 day Encourage oral intake Monitor urine output Repeat BMP in the morning.   Hypophosphatemia due to nutritional deficiency Phos repleted. Hypokalemia, mild, K+ repleted Monitor electrolytes daily and replete as needed.  Hypothyroidism Resume home levothyroxine .   Hypertension Hold off home oral antihypertensive due to soft BPs Closely monitor vital signs. 12/29 soft BP, discontinued antihypertensive for now   Hyperlipidemia: off stain   Prediabetes with hyperglycemia Last hemoglobin A1c 5.7 in 2021 Obtain hemoglobin A1c Insulin  sliding scale with hyperglycemia   Generalized weakness PT OT evaluation Fall precaution   There is no height or weight on file to calculate BMI.  Interventions:  Diet: Heart healthy carb modified DVT Prophylaxis: Subcutaneous Lovenox    Advance goals of care discussion: DNR-limited  Family Communication: family was present at bedside, at the time of interview.  The pt provided permission to discuss medical plan with the family. Opportunity was given to ask question and all questions were answered satisfactorily.   Disposition:  Pt is from ALF, admitted with AMS, influenza A, hypoxia, still has hypoxia and altered mental status, which precludes a safe discharge. Discharge to ALF, when stable, most likely tomorrow a.m.  Subjective: No significant events overnight. Early morning patient was confused and found naked at bedside, patient was redressed by RN. Patient stated that sometime she is having lower back pain and symptom headache and sometimes stomach ache.  Very poor historian. Patient seems  to be resting comfortably, denied any complaints, no shortness of breath and cough.   Physical Exam: General: NAD, lying comfortably Appear in no distress, affect appropriate Eyes: PERRLA ENT: Oral Mucosa Clear, moist  Neck: no JVD,  Cardiovascular: S1 and S2 Present, no Murmur,  Respiratory: good respiratory effort, Bilateral Air entry equal and Decreased, no Crackles, no wheezes Abdomen: Bowel Sound present, Soft and no tenderness,  Skin: no rashes Extremities: no Pedal edema, no calf tenderness Neurologic: without any new focal findings Gait not checked due to patient safety concerns  Vitals:   02/24/24 0500 02/24/24 0821 02/24/24 0831 02/24/24 1623  BP: (!) 83/53  (!) 107/92 106/82  Pulse: (!) 57  (!) 56 (!) 54  Resp: 17  18 18   Temp: 98.1 F (36.7 C)  (!) 97.5 F (36.4 C) 97.9 F (36.6 C)  TempSrc:      SpO2: 93% 90% 94% 91%    Intake/Output Summary (Last 24 hours) at 02/24/2024 1744 Last data filed at 02/24/2024 1701 Gross per 24 hour  Intake 919.95 ml  Output 0 ml  Net 919.95 ml   There were no vitals filed for this visit.  Data Reviewed: I have personally reviewed and interpreted daily labs, tele strips, imagings as discussed above. I reviewed all nursing notes, pharmacy notes, vitals, pertinent old records I have discussed plan of care as described above with RN and patient/family.  CBC: Recent Labs  Lab 02/22/24 2002 02/23/24 0145 02/23/24 0151 02/24/24 0319  WBC 7.0 8.3  --  5.9  HGB 13.3 13.0 13.6 11.8*  HCT 41.1 39.5 40.0 35.3*  MCV 94.5 92.3  --  90.7  PLT 251 268  --  263   Basic Metabolic Panel: Recent Labs  Lab 02/22/24 2002 02/23/24 0145 02/23/24 0151 02/24/24 0319  NA 132*  --  137 135  K 3.9  --  3.7 3.4*  CL 100  --   --  101  CO2 20*  --   --  22  GLUCOSE 168*  --   --  105*  BUN 20  --   --  16  CREATININE 1.13* 1.14*  --  0.89  CALCIUM  8.8*  --   --  8.9  MG  --   --   --  2.1  PHOS  --   --   --  1.7*    Studies: No  results found.   Scheduled Meds:  enoxaparin  (LOVENOX ) injection  40 mg Subcutaneous Daily   insulin  aspart  0-5 Units Subcutaneous QHS   insulin  aspart  0-9 Units Subcutaneous TID WC   levothyroxine   300 mcg Oral Q0600   nitrofurantoin  (macrocrystal-monohydrate)  100 mg Oral BID   oseltamivir   30 mg Oral BID   QUEtiapine   25 mg Oral QHS   Continuous Infusions:  sodium chloride  75 mL/hr at 02/24/24 1701   PRN Meds: acetaminophen , guaiFENesin -dextromethorphan, ipratropium-albuterol , ondansetron  (ZOFRAN ) IV, mouth rinse  Time spent: 35 minutes  Author: ELVAN SOR. MD [facility] 02/24/2024 5:44 PM  To reach On-call, see care teams to locate the attending and reach out to them via [email or web address]. If 7PM-7AM, please contact night-coverage If you still have difficulty reaching the attending provider, please page the [facility] (Director on  Call) for [facility] on amion for assistance.

## 2024-02-24 NOTE — TOC Initial Note (Addendum)
 Transition of Care [facility]) - Initial/Assessment Note    Patient Details  Name: Michele Dawson MRN: [id number] Date of Birth: 1940/08/11  [facility] CM/SW Contact:    Lendia Dais, LCSWA Phone Number: 02/24/2024, 12:44 PM  Clinical Narrative:   Pt is from [facility] and will return upon discharge per [facility]. MD notified the CSW that the pt is medically ready to discharge.  CSW spoke to pt's daughter Arlean via phone. Arlean expressed concern about uncertainity if [facility] could meet the pt's medical needs and wanting more time for her mother to recover from the flu. CSW stated understanding and that they would informed the MD. MD notified.  CSW will continue to monitor.             Expected Discharge Plan: Assisted Living Barriers to Discharge: Continued Medical Work up   Patient Goals and CMS Choice Patient states their goals for this hospitalization and ongoing recovery are:: pt disoriented x3          Expected Discharge Plan and Services In-house Referral: Clinical Social Work     Living arrangements for the past 2 months: Assisted Living Facility                                      Prior Living Arrangements/Services Living arrangements for the past 2 months: Assisted Living Facility Lives with:: Facility Resident Patient language and need for interpreter reviewed:: No Do you feel safe going back to the place where you live?:  (pt disoriented x3)      Need for Family Participation in Patient Care: Yes (Comment) Care giver support system in place?: Yes (comment)   Criminal Activity/Legal Involvement Pertinent to Current Situation/Hospitalization: No - Comment as needed  Activities of Daily Living      Permission Sought/Granted Permission sought to share information with : Family Supports Permission granted to share information with : No  Share Information with NAME: Arlean Schwab     Permission granted to share info  w Relationship: daughter  Permission granted to share info w Contact Information: [phone]  Emotional Assessment Appearance:: Appears stated age Attitude/Demeanor/Rapport: Unable to Assess Affect (typically observed): Unable to Assess Orientation: : Oriented to Self Alcohol  / Substance Use: Not Applicable Psych Involvement: No (comment)  Admission diagnosis:  Influenza A [J10.1] Altered mental status, unspecified altered mental status type [R41.82] Patient Active Problem List   Diagnosis Date Noted   Influenza A 02/23/2024   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/2019   History of total knee arthroplasty, left 06/10/2019   Rectal bleeding 11/12/2018   Hypothyroidism 11/12/2018   Toe fracture, left 5th toe 11/12/2018   Diabetes mellitus without complication (HCC) 11/12/2018   Anemia due to acute blood loss 11/12/2018   HTN (hypertension) 11/12/2018   Closed nondisplaced fracture of fifth left metatarsal bone    GERD (gastroesophageal reflux disease)    Small bowel intussusception (HCC) 10/27/2018   Disorder of thyroid  04/22/2007   DM 04/22/2007   HLD (hyperlipidemia) 04/22/2007   Sinusitis, chronic 04/22/2007   Diverticulosis of colon 04/22/2007   Arthropathy 04/22/2007   ELEVATED BLOOD PRESSURE 04/22/2007   PCP:  Cleotilde Planas, MD Pharmacy:   [facility] [phone] [facility] - [place], [place] - [address] [place] [address] Phone: [phone] Fax: [phone]     Social Drivers of Health (SDOH) Social History: SDOH Screenings   Food  Insecurity: Unknown (02/24/2024)  Housing: Patient Unable To Answer (02/24/2024)  Transportation Needs: Patient Unable To Answer (02/24/2024)  Utilities: Patient Unable To Answer (02/24/2024)  Social Connections: Patient Unable To Answer (02/24/2024)  Tobacco Use: Low Risk (02/24/2024)   SDOH Interventions:     Readmission Risk Interventions     No data to display

## 2024-02-25 ENCOUNTER — Inpatient Hospital Stay (HOSPITAL_COMMUNITY)

## 2024-02-25 ENCOUNTER — Encounter (HOSPITAL_COMMUNITY): Payer: Self-pay | Admitting: Internal Medicine

## 2024-02-25 DIAGNOSIS — R9431 Abnormal electrocardiogram [ECG] [EKG]: Secondary | ICD-10-CM | POA: Diagnosis not present

## 2024-02-25 DIAGNOSIS — J101 Influenza due to other identified influenza virus with other respiratory manifestations: Secondary | ICD-10-CM | POA: Diagnosis not present

## 2024-02-25 DIAGNOSIS — I429 Cardiomyopathy, unspecified: Secondary | ICD-10-CM | POA: Diagnosis not present

## 2024-02-25 LAB — GLUCOSE, CAPILLARY
Glucose-Capillary: 74 mg/dL (ref 70–99)
Glucose-Capillary: 98 mg/dL (ref 70–99)
Glucose-Capillary: 108 mg/dL — ABNORMAL HIGH (ref 70–99)

## 2024-02-25 LAB — BASIC METABOLIC PANEL WITH GFR
Anion gap: 12 (ref 5–15)
BUN: 16 mg/dL (ref 8–23)
CO2: 20 mmol/L — ABNORMAL LOW (ref 22–32)
Calcium: 8.8 mg/dL — ABNORMAL LOW (ref 8.9–10.3)
Chloride: 104 mmol/L (ref 98–111)
Creatinine, Ser: 0.97 mg/dL (ref 0.44–1.00)
GFR, Estimated: 58 mL/min — ABNORMAL LOW
Glucose, Bld: 101 mg/dL — ABNORMAL HIGH (ref 70–99)
Potassium: 3.5 mmol/L (ref 3.5–5.1)
Sodium: 135 mmol/L (ref 135–145)

## 2024-02-25 LAB — MAGNESIUM: Magnesium: 2 mg/dL (ref 1.7–2.4)

## 2024-02-25 LAB — PHOSPHORUS: Phosphorus: 2 mg/dL — ABNORMAL LOW (ref 2.5–4.6)

## 2024-02-25 LAB — ECHOCARDIOGRAM COMPLETE
Calc EF: 40.1 %
Est EF: 30
S' Lateral: 3.1 cm
Single Plane A2C EF: 38.9 %
Single Plane A4C EF: 40.9 %

## 2024-02-25 MED ORDER — POTASSIUM PHOSPHATES 15 MMOLE/5ML IV SOLN
30.0000 mmol | Freq: Once | INTRAVENOUS | Status: AC
  Administered 2024-02-25: 30 mmol via INTRAVENOUS
  Filled 2024-02-25: qty 10

## 2024-02-25 NOTE — Progress Notes (Signed)
 [facility] Progress Note  Patient: Michele Dawson    FMW:[id number]  DOA: 02/22/2024     Date of Service: the patient was seen and examined on 02/25/2024  Chief Complaint  Patient presents with   Altered Mental Status   Brief hospital course: Michele Dawson is a 83 y.o. female with medical history significant for hypertension, prediabetes, hyperlipidemia, hypothyroidism, who presents to the ER from SNF due to altered mental status for the last 2 days.  Associated with generalized weakness.  The patient was found in another resident's room.  Reportedly, she is new at the facility and has been there for 2 days.  EMS was activated.  Upon EMS arrival, the patient was hypotensive with SBP of 72 and DBP of 44.  Was noted to have cyanosis around her lips and was placed on 4 L nasal cannula to get her sats above 90%.  She received roughly 300 cc normal saline of IV fluid en route via EMS.   In the ER, alert and confused.  Influenza A positive.  Noncontrast head CT negative for any acute intracranial abnormality.  Chest x-ray was nonacute.  TRH, hospitalist service, was asked to admit.   ED Course: Temperature 97.1.  BP 98/73, pulse 59, respiration rate 19, O2 saturation 99% on 4 L nasal cannula.  Assessment and Plan:  Influenza A infection Tamiflu  75 mg twice daily x 5 days. DuoNebs every 6 hours for wheezing and shortness of breath As needed antitussives   Acute metabolic encephalopathy secondary to the above Continue to treat underlying conditions UA: LE moderate, nitrate negative, bacteria few, WBC 11-20 Reorient as needed Fall precautions. 12/29 c/o dysuria, started Macrobid  100 mg p.o. twice daily for 5 days  Bradycardia EKG looks abnormal, PACs. Continue monitor on telemetry TTE LVEF 30%, global hypokinesis, mild concentric LVH grade 2 DD Cardiology consulted   Respiratory distress in the setting of influenza A infection Reportedly the patient was noted with cyanosis  around her lips and was placed on 4 L nasal cannula to maintain her O2 saturation above 90%. 12/29 respiratory failure resolved, currently saturating well on room air    Hypovolemic hyponatremia: Resolved Serum sodium 132>135 Hold off home hydrochlorothiazide Gentle IV fluid hydration NS at 50 cc/h x 1 day. Encourage oral intake   AKI, prerenal in the setting of dehydration Baseline creatinine 0.77 with GFR greater than 60 Presented with creatinine 1.13 with GFR 48 IV fluid hydration NS at 50 cc/h x 1 day Encourage oral intake Monitor urine output Repeat BMP in the morning.   Hypophosphatemia due to nutritional deficiency Phos repleted. Hypokalemia, mild, K+ repleted Monitor electrolytes daily and replete as needed.  Hypothyroidism Resume home levothyroxine .   Hypertension Hold off home oral antihypertensive due to soft BPs Closely monitor vital signs. 12/29 soft BP, discontinued antihypertensive for now   Hyperlipidemia: off stain   Prediabetes with hyperglycemia Last hemoglobin A1c 5.7 in 2021 Obtain hemoglobin A1c Insulin  sliding scale with hyperglycemia   Generalized weakness PT OT evaluation Fall precaution   Body mass index is 23.98 kg/m.  Interventions:  Diet: Heart healthy carb modified DVT Prophylaxis: Subcutaneous Lovenox    Advance goals of care discussion: DNR-limited  Family Communication: family was present at bedside, at the time of interview.  The pt provided permission to discuss medical plan with the family. Opportunity was given to ask question and all questions were answered satisfactorily.   Disposition:  Pt is from ALF, admitted with AMS, influenza A, hypoxia, still has  hypoxia and altered mental status, which precludes a safe discharge. Discharge to ALF, when stable, most likely tomorrow a.m.  Subjective: No significant events overnight. Patient is awake and alert, denied any active issues, no chest pain or palpitation, no shortness of  breath.    Physical Exam: General: NAD, lying comfortably Appear in no distress, affect appropriate Eyes: PERRLA ENT: Oral Mucosa Clear, moist  Neck: no JVD,  Cardiovascular: S1 and S2 Present, no Murmur,  Respiratory: good respiratory effort, Bilateral Air entry equal and Decreased, no Crackles, no wheezes Abdomen: Bowel Sound present, Soft and no tenderness,  Skin: no rashes Extremities: no Pedal edema, no calf tenderness Neurologic: without any new focal findings Gait not checked due to patient safety concerns  Vitals:   02/25/24 0756 02/25/24 1217 02/25/24 1500 02/25/24 1634  BP: 96/73 (!) 115/98  101/71  Pulse: (!) 46 60  73  Resp: 18 17    Temp:  (!) 97.5 F (36.4 C)  98.2 F (36.8 C)  TempSrc:  Oral  Oral  SpO2: 99% 93%    Weight:   67.4 kg   Height:   5' 6 (1.676 m)     Intake/Output Summary (Last 24 hours) at 02/25/2024 1641 Last data filed at 02/25/2024 0500 Gross per 24 hour  Intake 542.17 ml  Output 0 ml  Net 542.17 ml   Filed Weights   02/25/24 1500  Weight: 67.4 kg    Data Reviewed: I have personally reviewed and interpreted daily labs, tele strips, imagings as discussed above. I reviewed all nursing notes, pharmacy notes, vitals, pertinent old records I have discussed plan of care as described above with RN and patient/family.  CBC: Recent Labs  Lab 02/22/24 2002 02/23/24 0145 02/23/24 0151 02/24/24 0319  WBC 7.0 8.3  --  5.9  HGB 13.3 13.0 13.6 11.8*  HCT 41.1 39.5 40.0 35.3*  MCV 94.5 92.3  --  90.7  PLT 251 268  --  263   Basic Metabolic Panel: Recent Labs  Lab 02/22/24 2002 02/23/24 0145 02/23/24 0151 02/24/24 0319 02/25/24 0330  NA 132*  --  137 135 135  K 3.9  --  3.7 3.4* 3.5  CL 100  --   --  101 104  CO2 20*  --   --  22 20*  GLUCOSE 168*  --   --  105* 101*  BUN 20  --   --  16 16  CREATININE 1.13* 1.14*  --  0.89 0.97  CALCIUM  8.8*  --   --  8.9 8.8*  MG  --   --   --  2.1 2.0  PHOS  --   --   --  1.7* 2.0*     Studies: ECHOCARDIOGRAM COMPLETE Result Date: 02/25/2024    ECHOCARDIOGRAM REPORT   Patient Name:   Michele Dawson Date of Exam: 02/25/2024 Medical Rec #:  [id number]        Height:       66.0 in Accession #:    [id number]       Weight:       148.5 lb Date of Birth:  10-17-1940        BSA:          1.762 m Patient Age:    83 years         BP:           96/73 mmHg Patient Gender: F  HR:           80 bpm. Exam Location:  Inpatient Procedure: 2D Echo, Color Doppler and Cardiac Doppler (Both Spectral and Color            Flow Doppler were utilized during procedure). Indications:    Abnormal ECG R94.31  History:        Patient has no prior history of Echocardiogram examinations.                 Abnormal ECG; Risk Factors:Dyslipidemia, Hypertension, Diabetes                 and GERD.  Sonographer:    Koleen Popper RDCS Referring Phys: [id number] [facility]  Sonographer Comments: Suboptimal parasternal window. Image acquisition challenging due to patient body habitus. IMPRESSIONS  1. Left ventricular ejection fraction, by estimation, is 30%. The left ventricle has moderate to severely decreased function. The left ventricle demonstrates global hypokinesis. There is mild concentric left ventricular hypertrophy. Left ventricular diastolic parameters are consistent with Grade II diastolic dysfunction (pseudonormalization).  2. Right ventricular systolic function is mildly reduced. The right ventricular size is normal. Tricuspid regurgitation signal is inadequate for assessing PA pressure.  3. Left atrial size was mildly dilated.  4. The mitral valve is degenerative. Trivial mitral valve regurgitation. No evidence of mitral stenosis. Moderate mitral annular calcification.  5. The aortic valve was not well visualized. There is moderate calcification of the aortic valve. Aortic valve regurgitation is mild. Aortic valve sclerosis/calcification is present, without any evidence of aortic stenosis.  6. The  inferior vena cava is normal in size with greater than 50% respiratory variability, suggesting right atrial pressure of 3 mmHg. FINDINGS  Left Ventricle: Left ventricular ejection fraction, by estimation, is 30%. The left ventricle has moderate to severely decreased function. The left ventricle demonstrates global hypokinesis. The left ventricular internal cavity size was normal in size. There is mild concentric left ventricular hypertrophy. Left ventricular diastolic parameters are consistent with Grade II diastolic dysfunction (pseudonormalization). Right Ventricle: The right ventricular size is normal. No increase in right ventricular wall thickness. Right ventricular systolic function is mildly reduced. Tricuspid regurgitation signal is inadequate for assessing PA pressure. The tricuspid regurgitant velocity is 1.45 m/s, and with an assumed right atrial pressure of 3 mmHg, the estimated right ventricular systolic pressure is 11.4 mmHg. Left Atrium: Left atrial size was mildly dilated. Right Atrium: Right atrial size was normal in size. Pericardium: There is no evidence of pericardial effusion. Mitral Valve: The mitral valve is degenerative in appearance. There is moderate calcification of the mitral valve leaflet(s). Moderate mitral annular calcification. Trivial mitral valve regurgitation. No evidence of mitral valve stenosis. Tricuspid Valve: The tricuspid valve is normal in structure. Tricuspid valve regurgitation is trivial. Aortic Valve: The aortic valve was not well visualized. There is moderate calcification of the aortic valve. Aortic valve regurgitation is mild. Aortic valve sclerosis/calcification is present, without any evidence of aortic stenosis. Pulmonic Valve: The pulmonic valve was normal in structure. Pulmonic valve regurgitation is not visualized. Aorta: The aortic root is normal in size and structure. Venous: The inferior vena cava is normal in size with greater than 50% respiratory  variability, suggesting right atrial pressure of 3 mmHg. IAS/Shunts: No atrial level shunt detected by color flow Doppler.  LEFT VENTRICLE PLAX 2D LVIDd:         4.90 cm      Diastology LVIDs:         3.10 cm  LV e' medial:  2.75 cm/s LV PW:         1.00 cm      LV e' lateral: 9.56 cm/s LV IVS:        1.40 cm LVOT diam:     2.30 cm LV SV:         81 LV SV Index:   46 LVOT Area:     4.15 cm  LV Volumes (MOD) LV vol d, MOD A2C: 138.0 ml LV vol d, MOD A4C: 186.0 ml LV vol s, MOD A2C: 84.3 ml LV vol s, MOD A4C: 110.0 ml LV SV MOD A2C:     53.7 ml LV SV MOD A4C:     186.0 ml LV SV MOD BP:      65.3 ml RIGHT VENTRICLE             IVC RV Basal diam:  3.30 cm     IVC diam: 1.40 cm RV S prime:     13.00 cm/s TAPSE (M-mode): 1.2 cm LEFT ATRIUM             Index LA diam:        4.30 cm 2.44 cm/m LA Vol (A2C):   44.5 ml 25.25 ml/m LA Vol (A4C):   56.1 ml 31.84 ml/m LA Biplane Vol: 55.2 ml 31.33 ml/m  AORTIC VALVE LVOT Vmax:   110.00 cm/s LVOT Vmean:  69.200 cm/s LVOT VTI:    0.194 m  AORTA Ao Root diam: 3.60 cm Ao Asc diam:  3.40 cm TRICUSPID VALVE TR Peak grad:   8.4 mmHg TR Vmax:        145.00 cm/s  SHUNTS Systemic VTI:  0.19 m Systemic Diam: 2.30 cm Dalton McleanMD Electronically signed by Ezra Kanner Signature Date/Time: 02/25/2024/4:14:32 PM    Final      Scheduled Meds:  enoxaparin  (LOVENOX ) injection  40 mg Subcutaneous Daily   insulin  aspart  0-5 Units Subcutaneous QHS   insulin  aspart  0-9 Units Subcutaneous TID WC   levothyroxine   300 mcg Oral Q0600   nitrofurantoin  (macrocrystal-monohydrate)  100 mg Oral BID   oseltamivir   30 mg Oral BID   QUEtiapine   25 mg Oral QHS   Continuous Infusions:   PRN Meds: acetaminophen , guaiFENesin -dextromethorphan, ipratropium-albuterol , ondansetron  (ZOFRAN ) IV, mouth rinse  Time spent: 35 minutes  Author: ELVAN SOR. MD [facility] 02/25/2024 4:41 PM  To reach On-call, see care teams to locate the attending and reach out to them via  [email or web address]. If 7PM-7AM, please contact night-coverage If you still have difficulty reaching the attending provider, please page the [facility] (Director on Call) for [facility] on amion for assistance.

## 2024-02-25 NOTE — Consult Note (Addendum)
 "  Cardiology Consultation   Patient ID: Michele Dawson MRN: [id number]; DOB: Dec 14, 1940  Admit date: 02/22/2024 Date of Consult: 02/25/2024  PCP:  Cleotilde Planas, MD   [facility] Providers Cardiologist:  Michele Schilling, MD      Patient Profile: Michele Dawson is a 83 y.o. female with a hx of hypertension, type 2 diabetes, hypothyroidism, hypercholesterolemia, who is being seen 02/25/2024 for the evaluation of abnormal EKG at the request of Von Bellis MD.  History of Present Illness: Michele Dawson is an 83 year old female who per chart review has not previously been seen by cardiology.  A CT chest on 06/2020 showed coronary artery calcifications.  Patient presented to the hospital from an assisted living facility for concerns of altered mental status.  Testing showed that she had an influenza A infection.  She did develop some respiratory distress requiring 4 L [place].  This has since resolved.  On interview patient was confused and thought that she is in [place] and did not know what year it currently is.  She also appeared to not know why she was currently in the hospital.  The patient reported that she sometimes walks or runs 1/2 to 1 mile outside.  I question whether this is accurate given patient's altered mental status.  Denies nicotine use, alcohol  use, or illicit substance use.  Denies chest pain, shortness of breath, orthopnea, lower extremity edema, or PND.  Labs showed elevated hemoglobin A1c of 6.5, potassium of 3.5, creatinine of 0.97, magnesium of 2.0, WBC count of 5.9, and anemia with a hemoglobin of 11.8.  EKG showed sinus arrhythmia with a rate of 62.  Appears to be possibly 1 conducted PAC and 1 nonconducted PAC.  Chest x-ray on 02/22/2024 showed no acute cardiopulmonary abnormality.  Echo pending  Past Medical History:  Diagnosis Date   Arthritis    Diabetes mellitus    Type II   Diverticulosis    Dyspnea    climbing stairs   Family history of  malignant neoplasm of gastrointestinal tract    GERD (gastroesophageal reflux disease)    Maalox prn   Hyperlipidemia    Hypertension    Memory loss    Rectal bleeding 11/12/2018    Past Surgical History:  Procedure Laterality Date   BREAST BIOPSY Right    2017-2018   CARPAL TUNNEL RELEASE Left    left   COLONOSCOPY     DIAGNOSTIC LAPAROSCOPY  10/27/2018   KNEE ARTHROSCOPY Left    LAPAROSCOPY N/A 10/27/2018   Procedure: DIAGNOSTIC LAPAROSCOPCY;  Surgeon: Vernetta Berg, MD;  Location: [facility] OR;  Service: General;  Laterality: N/A;   NASAL SEPTUM SURGERY     TOTAL KNEE ARTHROPLASTY Left 2009   left   TOTAL KNEE REVISION Left 06/10/2019   Procedure: Left knee polyethylene exchange;  Surgeon: Melodi Lerner, MD;  Location: [facility];  Service: Orthopedics;  Laterality: Left;    VAGINAL HYSTERECTOMY     with anterior and posterior vaginal repair     Home Medications:  Prior to Admission medications  Medication Sig Start Date End Date Taking? Authorizing Provider  levothyroxine  (SYNTHROID ) 300 MCG tablet Take 300 mcg by mouth daily.   Yes [provider]  lisinopril  (ZESTRIL ) 20 MG tablet Take 20 mg by mouth daily.   Yes [provider]  QUEtiapine  (SEROQUEL ) 25 MG tablet Take 25 mg by mouth at bedtime. 02/05/24  Yes [provider]    Scheduled Meds:  enoxaparin  (LOVENOX ) injection  40  mg Subcutaneous Daily   insulin  aspart  0-5 Units Subcutaneous QHS   insulin  aspart  0-9 Units Subcutaneous TID WC   levothyroxine   300 mcg Oral Q0600   nitrofurantoin  (macrocrystal-monohydrate)  100 mg Oral BID   oseltamivir   30 mg Oral BID   QUEtiapine   25 mg Oral QHS   Continuous Infusions:  sodium chloride  75 mL/hr at 02/25/24 1005   potassium PHOSPHATE  IVPB (in mmol) 30 mmol (02/25/24 1021)   PRN Meds: acetaminophen , guaiFENesin -dextromethorphan, ipratropium-albuterol , ondansetron  (ZOFRAN ) IV, mouth rinse  Allergies:   Allergies[1]  Social History:    Social History   Socioeconomic History   Marital status: Divorced    Spouse name: Not on file   Number of children: 2   Years of education: college - some graduate work   [employer] education level: Bachelor's degree (e.g., BA, AB, BS)  Occupational History   Occupation: retired  Tobacco Use   Smoking status: Never   Smokeless tobacco: Never  Vaping Use   Vaping status: Never Used  Substance and Sexual Activity   Alcohol  use: Yes    Comment: social   Drug use: Never   Sexual activity: Not on file  Other Topics Concern   Not on file  Social History Narrative   Lives at home with her pet cat.   Left-handed.   Caffeine use: 2 cups per day.   Social Drivers of Health   Tobacco Use: Low Risk (02/24/2024)   Patient History    Smoking Tobacco Use: Never    Smokeless Tobacco Use: Never    Passive Exposure: Not on file  Financial Resource Strain: Not on file  Food Insecurity: Unknown (02/24/2024)   Epic    Worried About Programme Researcher, Broadcasting/film/video in the Last Year: Not on file    [employer] of Food in the Last Year: Patient unable to answer  Transportation Needs: Patient Unable To Answer (02/24/2024)   Epic    Lack of Transportation (Medical): Patient unable to answer    Lack of Transportation (Non-Medical): Patient unable to answer  Physical Activity: Not on file  Stress: Not on file  Social Connections: Patient Unable To Answer (02/24/2024)   Social Connection and Isolation Panel    Frequency of Communication with Friends and Family: Patient unable to answer    Frequency of Social Gatherings with Friends and Family: Patient unable to answer    Attends Religious Services: Patient unable to answer    Active Member of Clubs or Organizations: Patient unable to answer    Attends Banker Meetings: Patient unable to answer    Marital Status: Patient unable to answer  Intimate Partner Violence: Patient Unable To Answer (02/24/2024)   Epic    Fear of Current or Ex-Partner:  Patient unable to answer    Emotionally Abused: Patient unable to answer    Physically Abused: Patient unable to answer    Sexually Abused: Patient unable to answer  Depression (EYV7-0): Not on file  Alcohol  Screen: Not on file  Housing: Patient Unable To Answer (02/24/2024)   Epic    Unable to Pay for Housing in the Last Year: Patient unable to answer    Number of Times Moved in the Last Year: Not on file    Homeless in the Last Year: Patient unable to answer  Utilities: Patient Unable To Answer (02/24/2024)   Epic    Threatened with loss of utilities: Patient unable to answer  Health Literacy: Not on file  Family History:    Family History  Problem Relation Age of Onset   Colon cancer Sister    Breast cancer Mother    Tongue cancer Father    Heart disease Father    Breast cancer Other        aunt x 2     ROS:  Please see the history of present illness.   All other ROS reviewed and negative.     Physical Exam/Data: Vitals:   02/24/24 2033 02/25/24 0522 02/25/24 0756 02/25/24 1217  BP: 93/64 100/83 96/73 (!) 115/98  Pulse: 62 (!) 50 (!) 46 60  Resp: 18 18 18 17   Temp: 97.8 F (36.6 C) (!) 97.4 F (36.3 C)  (!) 97.5 F (36.4 C)  TempSrc:  Oral  Oral  SpO2: 95% 90% 99% 93%    Intake/Output Summary (Last 24 hours) at 02/25/2024 1448 Last data filed at 02/25/2024 0500 Gross per 24 hour  Intake 542.17 ml  Output 0 ml  Net 542.17 ml      11/05/2019   10:57 AM 06/10/2019   12:50 PM 06/10/2019    7:48 AM  Last 3 Weights  Weight (lbs) 148 lb 8 oz 171 lb 1.2 oz 171 lb 1.2 oz  Weight (kg) 67.359 kg 77.6 kg 77.6 kg     There is no height or weight on file to calculate BMI.  General:  Well nourished, in no acute distress.  Patient was confused and disorientated.  She knew her name and date of birth but did not know where she was at or what year it currently is.  Patient was on about 1 L nasal cannula. HEENT: normal Neck: no JVD Vascular: No carotid bruits; Distal  pulses 2+ bilaterally Cardiac:  normal S1, S2; RRR; no murmur. Lungs: Diffuse wheezing heard throughout the lungs. Abd: soft, nontender, no hepatomegaly  Ext: no edema Musculoskeletal:  No deformities Skin: warm and dry  Neuro:   no focal abnormalities noted Psych:  Normal affect   EKG:  The EKG was personally reviewed and demonstrates:  sinus arrhythmia with a rate of 62.  Appears to be possibly 1 conducted PAC and 1 nonconducted PAC. Telemetry:  Telemetry was personally reviewed and demonstrates: Sinus arrhythmia in the 50s and 70s.  There are frequent PACs.  Appears like they are multifocal P waves.  Relevant CV Studies: Echo pending  Laboratory Data: High Sensitivity Troponin:  No results for input(s): TROPONINIHS in the last 720 hours. No results for input(s): TRNPT in the last 720 hours.    Chemistry Recent Labs  Lab 02/22/24 2002 02/23/24 0145 02/23/24 0151 02/24/24 0319 02/25/24 0330  NA 132*  --  137 135 135  K 3.9  --  3.7 3.4* 3.5  CL 100  --   --  101 104  CO2 20*  --   --  22 20*  GLUCOSE 168*  --   --  105* 101*  BUN 20  --   --  16 16  CREATININE 1.13* 1.14*  --  0.89 0.97  CALCIUM  8.8*  --   --  8.9 8.8*  MG  --   --   --  2.1 2.0  GFRNONAA 48* 48*  --  >60 58*  ANIONGAP 12  --   --  12 12    Recent Labs  Lab 02/22/24 2002  PROT 6.8  ALBUMIN 4.0  AST 35  ALT 7  ALKPHOS 57  BILITOT 0.6   Lipids No results for  input(s): CHOL, TRIG, HDL, LABVLDL, LDLCALC, CHOLHDL in the last 168 hours.  Hematology Recent Labs  Lab 02/22/24 2002 02/23/24 0145 02/23/24 0151 02/24/24 0319  WBC 7.0 8.3  --  5.9  RBC 4.35 4.28  --  3.89  HGB 13.3 13.0 13.6 11.8*  HCT 41.1 39.5 40.0 35.3*  MCV 94.5 92.3  --  90.7  MCH 30.6 30.4  --  30.3  MCHC 32.4 32.9  --  33.4  RDW 15.0 15.1  --  15.1  PLT 251 268  --  263   Thyroid  No results for input(s): TSH, FREET4 in the last 168 hours.  BNPNo results for input(s): BNP, PROBNP in the last 168  hours.  DDimer No results for input(s): DDIMER in the last 168 hours.  Radiology/Studies:  CT Head Wo Contrast Result Date: 02/23/2024 EXAM: CT HEAD WITHOUT CONTRAST 02/22/2024 11:33:25 PM TECHNIQUE: CT of the head was performed without the administration of intravenous contrast. Automated exposure control, iterative reconstruction, and/or weight based adjustment of the mA/kV was utilized to reduce the radiation dose to as low as reasonably achievable. COMPARISON: None available. CLINICAL HISTORY: Mental status change, unknown cause FINDINGS: BRAIN AND VENTRICLES: No acute hemorrhage. No evidence of acute infarct. No hydrocephalus. No extra-axial collection. No mass effect or midline shift. Age-related atrophy. Periventricular and deep white matter hypodensity consistent with chronic small vessel ischemia. ORBITS: No acute abnormality. SINUSES: Mild mucosal thickening of ethmoid air cells. Fluid opacification of lower left mastoid air cells without superimposed osseous erosion. SOFT TISSUES AND SKULL: No acute soft tissue abnormality. No skull fracture. Atherosclerosis of skullbase vasculature without hyperdense vessel or abnormal calcification. IMPRESSION: 1. No acute intracranial abnormality. 2. Age related atrophy and chronic small vessel ischemic changes . Electronically signed by: Dorethia Molt MD 02/23/2024 12:23 AM EST RP Workstation: [id number]   DG Chest Port 1 View Result Date: 02/22/2024 EXAM: 1 VIEW(S) XRAY OF THE CHEST 02/22/2024 08:34:20 PM COMPARISON: Chest x-ray 01/13/2020, CT chest 07/06/2020. CLINICAL HISTORY: weak FINDINGS: LIMITATIONS/ARTIFACTS: The patient is rotated. LUNGS AND PLEURA: Low lung volumes. No focal pulmonary opacity. No pleural effusion. No pneumothorax. HEART AND MEDIASTINUM: Widening of the mediastinum due to rotation. BONES AND SOFT TISSUES: Bilateral shoulder degenerative changes. IMPRESSION: 1. No acute cardiopulmonary abnormality. Electronically signed by: Morgane  Naveau MD 02/22/2024 09:23 PM EST RP Workstation: [id number]     Assessment and Plan: Michele Dawson is a 83 y.o. female with a hx of hypertension, type 2 diabetes, hypothyroidism, hypercholesterolemia, who is being seen 02/25/2024 for the evaluation of abnormal EKG at the request of Von Bellis MD.  Influenza A Respiratory hypoxia -resolved Altered mental status Patient presented to the hospital for concerns of altered mental status.  Testing showed that she had an influenza A infection.  She did develop some respiratory distress requiring 4 L [place].  This has since resolved. Chest x-ray on 02/22/2024 showed no acute cardiopulmonary abnormality.    Abnormal EKG Sinus arrhythmia EKG showed sinus arrhythmia with a rate of 62.  Appears to be possibly 1 conducted PAC and 1 nonconducted PAC.  Telemetry also shows sinus arrhythmia with frequent PACs.   Type 2 diabetes Labs showed elevated hemoglobin A1c of 6.5 Management per primary   Otherwise management per primary   Risk Assessment/Risk Scores:    For questions or updates, please contact [facility] Please consult [email or web address] for contact info under    Signed, Morse Clause, PA-C  02/25/2024 2:48 PM  History and all data above reviewed.  I personally took the history today, performed the physical exam and formulated the substantive portion of the assessment and plan.  I reviewed all relevant tests and studies.  The patient has significant confusion and is unable to get any history and physical.  The reason for presentation comes from the ER notes.  I also was able to review the EMS run sheet.  The patient was at her nursing home and had some altered mental status.  She was sitting in in another resident's room and was slumped forward when staff found her.  She had confusion.  She has dementia and by her daughters report sundowning.  The patient does not describe any acute symptoms.  We are asked to see because of an  abnormal EKG.  This is described below.  She is also noted now on echocardiogram to have a reduced ejection fraction.  By our understanding she has not had prior cardiac workup.  She is being managed for influenza A.      Patient examined.  I agree with the findings as above.      The patient exam reveals COR:RRR with ectopy.   ,  Lungs: Clear  ,  Abd: Positive bowel sounds, no rebound no guarding, Ext No edema   .    All available labs, radiology testing, previous records reviewed. Agree with documented assessment and plan.    Arrhythmia: I reviewed her EKG and she has sinus rhythm, sinus bradycardia, Mobitz type I, PACs nonconducted.  There is no evidence of atrial fibrillation.  She has had no symptoms related to any bradycardia arrhythmias.  She will continue on telemetry.    Cardiomyopathy: This seems to be a new diagnosis.  I reviewed the echocardiogram images.  She is not having any overt heart failure symptoms.  I would manage this conservatively for now and follow-up with echocardiograms in the future and consider further workup pending her mental status and repeat echoes.  For now she has low blood pressure which would preclude med titration.  At present I am not considering an ischemia workup but would consider this pending again her mental status and ability to participate in an evaluation in the future.  Michele Dawson  5:17 PM  02/25/2024     [1]  Allergies Allergen Reactions   Shellfish Allergy Hives   Sulfa Antibiotics Hives    blotches   Penicillins Rash        "

## 2024-02-25 NOTE — Plan of Care (Signed)
   Problem: Education: Goal: Ability to describe self-care measures that may prevent or decrease complications (Diabetes Survival Skills Education) will improve Outcome: Progressing Goal: Individualized Educational Video(s) Outcome: Progressing   Problem: Coping: Goal: Ability to adjust to condition or change in health will improve Outcome: Progressing

## 2024-02-25 NOTE — Progress Notes (Signed)
" °  Echocardiogram 2D Echocardiogram has been performed.  Koleen KANDICE Popper, RDCS 02/25/2024, 1:59 PM "

## 2024-02-26 DIAGNOSIS — I429 Cardiomyopathy, unspecified: Secondary | ICD-10-CM | POA: Diagnosis not present

## 2024-02-26 LAB — GLUCOSE, CAPILLARY
Glucose-Capillary: 89 mg/dL (ref 70–99)
Glucose-Capillary: 110 mg/dL — ABNORMAL HIGH (ref 70–99)
Glucose-Capillary: 150 mg/dL — ABNORMAL HIGH (ref 70–99)
Glucose-Capillary: 169 mg/dL — ABNORMAL HIGH (ref 70–99)

## 2024-02-26 MED ORDER — LOSARTAN POTASSIUM 25 MG PO TABS
12.5000 mg | ORAL_TABLET | Freq: Every day | ORAL | Status: DC
  Administered 2024-02-26 – 2024-02-27 (×2): 12.5 mg via ORAL
  Filled 2024-02-26 (×2): qty 1

## 2024-02-26 NOTE — Progress Notes (Addendum)
 "  Progress Note  Patient Name: Michele Dawson Date of Encounter: 02/26/2024 [facility] Cardiologist: Lynwood Schilling, MD   Interval Summary   Sitting up in bed comfortably during the interview.  Conversational but still slightly confused at times. Denies any shortness of breath, orthopnea, PND, lower extremity edema.  Reported having some intermittent chest pain but difficult to assess given that she is a poor historian.  Vital Signs Vitals:   02/25/24 2021 02/26/24 0533 02/26/24 0533 02/26/24 0739  BP: 108/74 103/86 103/86 114/78  Pulse: 74 76 (!) 56 (!) 54  Resp: 15 16  18   Temp: (!) 97.5 F (36.4 C) 97.6 F (36.4 C)  (!) 97.3 F (36.3 C)  TempSrc: Oral Oral  Oral  SpO2: 100% 98% 98% 98%  Weight:      Height:        Intake/Output Summary (Last 24 hours) at 02/26/2024 0909 Last data filed at 02/26/2024 0549 Gross per 24 hour  Intake --  Output 0 ml  Net 0 ml      02/25/2024    3:00 PM 11/05/2019   10:57 AM 06/10/2019   12:50 PM  Last 3 Weights  Weight (lbs) 148 lb 9.4 oz 148 lb 8 oz 171 lb 1.2 oz  Weight (kg) 67.4 kg 67.359 kg 77.6 kg      Telemetry/ECG  Sinus arrhythmia with frequent PACs and heart rates in the 60s to 80s- Personally Reviewed  Physical Exam  GEN: No acute distress.   Neck: No JVD Cardiac: Irregular rhythm, no murmurs, rubs, or gallops.  Respiratory: Diffuse wheezing heard throughout the lungs. GI: Soft, nontender, non-distended  MS: No edema  Assessment & Plan  Michele Dawson is a 83 y.o. female with a hx of hypertension, type 2 diabetes, hypothyroidism, hypercholesterolemia, who is being seen 02/25/2024 for the evaluation of abnormal EKG at the request of Von Bellis MD.   Influenza A Respiratory hypoxia -resolved Altered mental status Patient presented to the hospital for concerns of altered mental status.  Testing showed that she had an influenza A infection.  She did develop some respiratory distress requiring 4 L [place].   This has since resolved.  Question if patient also has some underlying dementia as well. Chest x-ray on 02/22/2024 showed no acute cardiopulmonary abnormality.    Cardiomyopathy TTE on 02/25/2024 showed a reduced LVEF of 30%, global hypokinesis, mild concentric LVH, mildly reduced RV systolic function, G2 DD, moderate MAC, moderate aortic valve calcification, moderate aortic valve calcification, mild aortic regurgitation, and IVC with greater than 50% respiratory variability. Patient's weight on 02/25/2024 was 67 KGs.  On 10/2019 it was 67 KGs. Patient appears euvolemic on exam. Labs showed potassium of 3.5, creatinine of 0.97, magnesium of 2.  Blood pressures are slightly soft at times.  Most recent BP at around 7:30 AM this morning was 114/78. GDMT Will avoid starting SGLT2 for now given is currently being treated for a UTI. May consider starting losartan 25 mg daily. If patient blood pressure tolerates losartan in the future may also consider starting low-dose metoprolol succinate and low-dose Aldactone.    Abnormal EKG Sinus arrhythmia EKG showed sinus arrhythmia with a rate of 62.  Appears to be possibly 1 conducted PAC and 1 nonconducted PAC.  Telemetry also shows sinus arrhythmia with frequent PACs.     Type 2 diabetes Labs showed elevated hemoglobin A1c of 6.5 Management per primary     Otherwise management per primary   For questions or updates,  please contact [facility] Please consult [email or web address] for contact info under    Signed, Morse Clause, PA-C   History and all data above reviewed.  I personally took the history today, performed the physical exam and formulated the substantive portion of the assessment and plan.  I reviewed all relevant tests and studies. Patient examined.  I agree with the findings as above. She has dementia and is unable to give any history.  However, she does not appear to have any acute complaints.   The patient exam reveals COR:RRR  ,   Lungs: CTA  ,  Abd: Positive bowel sounds, no rebound no guarding, Ext No edema  .  All available labs, radiology testing, previous records reviewed. Agree with documented assessment and plan. Arrhythmias:  No atrial fib.  No significant arrhythmias.  No change in therapy.  CM:  I am going to try to start with a very low dose ARB.  Thankfully she seems to be euvolemic.    Lynwood Lesley Galentine  11:14 AM  02/26/2024   "

## 2024-02-26 NOTE — Plan of Care (Signed)

## 2024-02-26 NOTE — Progress Notes (Signed)
 " PROGRESS NOTE    Michele Dawson  FMW:[id number] DOB: 12-27-1940 DOA: 02/22/2024 PCP: Cleotilde Planas, MD    Brief Narrative:   83 y.o. female with medical history significant for hypertension, prediabetes, hyperlipidemia, hypothyroidism, who presents to the ER from SNF due to altered mental status for the last 2 days.  Associated with generalized weakness.  The patient was found in another resident's room.  Reportedly, she is new at the facility and has been there for 2 days.  EMS was activated.  Upon EMS arrival, the patient was hypotensive with SBP of 72 and DBP of 44.  Was noted to have cyanosis around her lips and was placed on 4 L nasal cannula to get her sats above 90%.  She received roughly 300 cc normal saline of IV fluid en route via EMS. In the ER, alert and confused. Temperature 97.1.  BP 98/73, pulse 59, respiration rate 19, O2 saturation 99% on 4 L nasal cannula. Influenza A positive.  Noncontrast head CT negative for any acute intracranial abnormality.  Chest x-ray was nonacute. She was    Assessment and Plan:  Influenza A infection Tamiflu  30 mg twice daily x 5 days. DuoNebs every 6 hours for wheezing and shortness of breath As needed antitussives  Acute cystitis UA: LE moderate, nitrate negative, bacteria few, WBC 11-20 12/29 c/o dysuria No urine culture collected Continue Macrobid  100 mg p.o. twice daily for 5 days  Acute metabolic encephalopathy secondary to the above Continue to treat underlying conditions Reorient as needed Fall precautions.  AKI, prerenal in the setting of dehydration Baseline creatinine 0.77 with GFR greater than 60 Presented with creatinine 1.13 with GFR 48 Resolved with IVFs Encourage oral intake Cr stable  Bradycardia Cardiology following, reported no concerns at this time as patient is asymptomatic Continue monitor on telemetry  Cardiomyopathy TTE LVEF 30%, global hypokinesis, mild concentric LVH grade 2 DD Cardiology  No  signs/symptoms of overt heart failure Cardiology following, deferring ischemic workup at this time due to mental status, recommending conservative management with follow up echos Started Losartan 12.5 mg  Avoiding SGLT2i given UTIs   Respiratory distress in the setting of influenza A infection - resolved Reportedly the patient was noted with cyanosis around her lips and was placed on 4 L nasal cannula to maintain her O2 saturation above 90%. 12/29 respiratory failure resolved, currently saturating well on room air   Hypovolemic hyponatremia: Resolved Serum sodium 132>135 Hold off home hydrochlorothiazide Corrected with IVFs Encourage oral intake   Hypophosphatemia due to nutritional deficiency Phos repleted. Hypokalemia, mild, K+ repleted Monitor electrolytes daily and replete as needed.   Hypothyroidism Resume home levothyroxine .   Hypertension Home oral antihypertensive initially held due to soft BPs BP improving Cardiology started Losartan 12.5 mg  Monitor BP   Hyperlipidemia: off stain   Prediabetes with hyperglycemia Last hemoglobin A1c 5.7 in 2021 Obtain hemoglobin A1c Insulin  sliding scale with hyperglycemia   Generalized weakness PT OT evaluation Fall precaution   Scheduled Meds:  enoxaparin  (LOVENOX ) injection  40 mg Subcutaneous Daily   insulin  aspart  0-5 Units Subcutaneous QHS   insulin  aspart  0-9 Units Subcutaneous TID WC   levothyroxine   300 mcg Oral Q0600   losartan  12.5 mg Oral Daily   nitrofurantoin  (macrocrystal-monohydrate)  100 mg Oral BID   oseltamivir   30 mg Oral BID   QUEtiapine   25 mg Oral QHS   Continuous Infusions: PRN Meds:.acetaminophen , guaiFENesin -dextromethorphan, ipratropium-albuterol , ondansetron  (ZOFRAN ) IV, mouth rinse  Current Outpatient Medications  Medication Instructions   levothyroxine  (SYNTHROID ) 300 mcg, Oral, Daily   lisinopril  (ZESTRIL ) 20 mg, Oral, Daily   QUEtiapine  (SEROQUEL ) 25 mg, Oral, Daily at bedtime     DVT prophylaxis: enoxaparin  (LOVENOX ) injection 40 mg Start: 02/23/24 1000   Code Status:   Code Status: Limited: Do not attempt resuscitation (DNR) -DNR-LIMITED -Do Not Intubate/DNI   Family Communication: None  Disposition Plan: Will likely return to ALF tomorrow if BP remains stable  PT -   -   OT -   -    Level of care: Telemetry  Consultants:  Cardiology  Procedures:  None  Antimicrobials: Macrobid    Subjective: NAEO. Patient oriented to person only. No acute concerns  Objective: Vitals:   02/26/24 0533 02/26/24 0739 02/26/24 1658 02/26/24 1948  BP: 103/86 114/78 104/65 (!) 132/90  Pulse: (!) 56 (!) 54 84 (!) 54  Resp:  18 18 18   Temp:  (!) 97.3 F (36.3 C) 98.1 F (36.7 C) 98 F (36.7 C)  TempSrc:  Oral    SpO2: 98% 98% 90% 90%  Weight:      Height:        Intake/Output Summary (Last 24 hours) at 02/26/2024 2259 Last data filed at 02/26/2024 0549 Gross per 24 hour  Intake --  Output 0 ml  Net 0 ml   Filed Weights   02/25/24 1500  Weight: 67.4 kg    Examination:  Gen: NAD, A&Ox 1 (perosn) HEENT: NCAT Neck: Supple, CV: RRR, no murmurs Resp: normal WOB, CTAB, no w/r/r Abd: Soft, NTND, no guarding, BS normoactive Ext: No LE edema Skin: Warm, dry, no rashes/lesions Neuro: Could not adequately assess given mental status    Data Reviewed: I have personally reviewed following labs and imaging studies  CBC: Recent Labs  Lab 02/22/24 2002 02/23/24 0145 02/23/24 0151 02/24/24 0319  WBC 7.0 8.3  --  5.9  HGB 13.3 13.0 13.6 11.8*  HCT 41.1 39.5 40.0 35.3*  MCV 94.5 92.3  --  90.7  PLT 251 268  --  263   Basic Metabolic Panel: Recent Labs  Lab 02/22/24 2002 02/23/24 0145 02/23/24 0151 02/24/24 0319 02/25/24 0330  NA 132*  --  137 135 135  K 3.9  --  3.7 3.4* 3.5  CL 100  --   --  101 104  CO2 20*  --   --  22 20*  GLUCOSE 168*  --   --  105* 101*  BUN 20  --   --  16 16  CREATININE 1.13* 1.14*  --  0.89 0.97  CALCIUM  8.8*   --   --  8.9 8.8*  MG  --   --   --  2.1 2.0  PHOS  --   --   --  1.7* 2.0*   GFR: Estimated Creatinine Clearance: 41.9 mL/min (by C-G formula based on SCr of 0.97 mg/dL). Liver Function Tests: Recent Labs  Lab 02/22/24 2002  AST 35  ALT 7  ALKPHOS 57  BILITOT 0.6  PROT 6.8  ALBUMIN 4.0   No results for input(s): LIPASE, AMYLASE in the last 168 hours. No results for input(s): AMMONIA in the last 168 hours. Coagulation Profile: No results for input(s): INR, PROTIME in the last 168 hours. Cardiac Enzymes: No results for input(s): CKTOTAL, CKMB, CKMBINDEX, TROPONINI in the last 168 hours. BNP (last 3 results) No results for input(s): PROBNP in the last 8760 hours. HbA1C: No results for input(s): HGBA1C in the last 72 hours.  CBG: Recent Labs  Lab 02/25/24 1628 02/26/24 0738 02/26/24 1118 02/26/24 1657 02/26/24 1946  GLUCAP 108* 89 110* 150* 169*   Lipid Profile: No results for input(s): CHOL, HDL, LDLCALC, TRIG, CHOLHDL, LDLDIRECT in the last 72 hours. Thyroid  Function Tests: No results for input(s): TSH, T4TOTAL, FREET4, T3FREE, THYROIDAB in the last 72 hours. Anemia Panel: No results for input(s): VITAMINB12, FOLATE, FERRITIN, TIBC, IRON, RETICCTPCT in the last 72 hours. Sepsis Labs: No results for input(s): PROCALCITON, LATICACIDVEN in the last 168 hours.  Recent Results (from the past 240 hours)  Resp panel by RT-PCR (RSV, Flu A&B, Covid) Anterior Nasal Swab     Status: Abnormal   Collection Time: 02/22/24  8:37 PM   Specimen: Anterior Nasal Swab  Result Value Ref Range Status   SARS Coronavirus 2 by RT PCR NEGATIVE NEGATIVE Final   Influenza A by PCR POSITIVE (A) NEGATIVE Final   Influenza B by PCR NEGATIVE NEGATIVE Final    Comment: (NOTE) The Xpert Xpress SARS-CoV-2/FLU/RSV plus assay is intended as an aid in the diagnosis of influenza from Nasopharyngeal swab specimens and should not be used as a  sole basis for treatment. Nasal washings and aspirates are unacceptable for Xpert Xpress SARS-CoV-2/FLU/RSV testing.  Fact Sheet for Patients: bloggercourse.com  Fact Sheet for Healthcare Providers: seriousbroker.it  This test is not yet approved or cleared by the [place]  FDA and has been authorized for detection and/or diagnosis of SARS-CoV-2 by FDA under an Emergency Use Authorization (EUA). This EUA will remain in effect (meaning this test can be used) for the duration of the COVID-19 declaration under Section 564(b)(1) of the Act, 21 U.S.C. section 360bbb-3(b)(1), unless the authorization is terminated or revoked.     Resp Syncytial Virus by PCR NEGATIVE NEGATIVE Final    Comment: (NOTE) Fact Sheet for Patients: bloggercourse.com  Fact Sheet for Healthcare Providers: seriousbroker.it  This test is not yet approved or cleared by the [place]  FDA and has been authorized for detection and/or diagnosis of SARS-CoV-2 by FDA under an Emergency Use Authorization (EUA). This EUA will remain in effect (meaning this test can be used) for the duration of the COVID-19 declaration under Section 564(b)(1) of the Act, 21 U.S.C. section 360bbb-3(b)(1), unless the authorization is terminated or revoked.  Performed at [facility], [address] [place], [place] [address]   MRSA Next Gen by PCR, Nasal     Status: None   Collection Time: 02/24/24 12:59 AM   Specimen: Nasal Mucosa; Nasal Swab  Result Value Ref Range Status   MRSA by PCR Next Gen NOT DETECTED NOT DETECTED Final    Comment: (NOTE) The GeneXpert MRSA Assay (FDA approved for NASAL specimens only), is one component of a comprehensive MRSA colonization surveillance program. It is not intended to diagnose MRSA infection nor to guide or monitor treatment for MRSA infections. Test performance is not FDA approved  in patients less than 74 years old. Performed at [facility], [address] [place], [place] [address]      Radiology Studies: ECHOCARDIOGRAM COMPLETE Result Date: 02/25/2024    ECHOCARDIOGRAM REPORT   Patient Name:   Michele Dawson Date of Exam: 02/25/2024 Medical Rec #:  [id number]        Height:       66.0 in Accession #:    [id number]       Weight:       148.5 lb Date of Birth:  08-22-1940  BSA:          1.762 m Patient Age:    83 years         BP:           96/73 mmHg Patient Gender: F                HR:           80 bpm. Exam Location:  Inpatient Procedure: 2D Echo, Color Doppler and Cardiac Doppler (Both Spectral and Color            Flow Doppler were utilized during procedure). Indications:    Abnormal ECG R94.31  History:        Patient has no prior history of Echocardiogram examinations.                 Abnormal ECG; Risk Factors:Dyslipidemia, Hypertension, Diabetes                 and GERD.  Sonographer:    Koleen Popper RDCS Referring Phys: [id number] [facility]  Sonographer Comments: Suboptimal parasternal window. Image acquisition challenging due to patient body habitus. IMPRESSIONS  1. Left ventricular ejection fraction, by estimation, is 30%. The left ventricle has moderate to severely decreased function. The left ventricle demonstrates global hypokinesis. There is mild concentric left ventricular hypertrophy. Left ventricular diastolic parameters are consistent with Grade II diastolic dysfunction (pseudonormalization).  2. Right ventricular systolic function is mildly reduced. The right ventricular size is normal. Tricuspid regurgitation signal is inadequate for assessing PA pressure.  3. Left atrial size was mildly dilated.  4. The mitral valve is degenerative. Trivial mitral valve regurgitation. No evidence of mitral stenosis. Moderate mitral annular calcification.  5. The aortic valve was not well visualized. There is moderate calcification of the aortic valve. Aortic  valve regurgitation is mild. Aortic valve sclerosis/calcification is present, without any evidence of aortic stenosis.  6. The inferior vena cava is normal in size with greater than 50% respiratory variability, suggesting right atrial pressure of 3 mmHg. FINDINGS  Left Ventricle: Left ventricular ejection fraction, by estimation, is 30%. The left ventricle has moderate to severely decreased function. The left ventricle demonstrates global hypokinesis. The left ventricular internal cavity size was normal in size. There is mild concentric left ventricular hypertrophy. Left ventricular diastolic parameters are consistent with Grade II diastolic dysfunction (pseudonormalization). Right Ventricle: The right ventricular size is normal. No increase in right ventricular wall thickness. Right ventricular systolic function is mildly reduced. Tricuspid regurgitation signal is inadequate for assessing PA pressure. The tricuspid regurgitant velocity is 1.45 m/s, and with an assumed right atrial pressure of 3 mmHg, the estimated right ventricular systolic pressure is 11.4 mmHg. Left Atrium: Left atrial size was mildly dilated. Right Atrium: Right atrial size was normal in size. Pericardium: There is no evidence of pericardial effusion. Mitral Valve: The mitral valve is degenerative in appearance. There is moderate calcification of the mitral valve leaflet(s). Moderate mitral annular calcification. Trivial mitral valve regurgitation. No evidence of mitral valve stenosis. Tricuspid Valve: The tricuspid valve is normal in structure. Tricuspid valve regurgitation is trivial. Aortic Valve: The aortic valve was not well visualized. There is moderate calcification of the aortic valve. Aortic valve regurgitation is mild. Aortic valve sclerosis/calcification is present, without any evidence of aortic stenosis. Pulmonic Valve: The pulmonic valve was normal in structure. Pulmonic valve regurgitation is not visualized. Aorta: The aortic root  is normal in size and structure. Venous: The inferior vena cava is normal in  size with greater than 50% respiratory variability, suggesting right atrial pressure of 3 mmHg. IAS/Shunts: No atrial level shunt detected by color flow Doppler.  LEFT VENTRICLE PLAX 2D LVIDd:         4.90 cm      Diastology LVIDs:         3.10 cm      LV e' medial:  2.75 cm/s LV PW:         1.00 cm      LV e' lateral: 9.56 cm/s LV IVS:        1.40 cm LVOT diam:     2.30 cm LV SV:         81 LV SV Index:   46 LVOT Area:     4.15 cm  LV Volumes (MOD) LV vol d, MOD A2C: 138.0 ml LV vol d, MOD A4C: 186.0 ml LV vol s, MOD A2C: 84.3 ml LV vol s, MOD A4C: 110.0 ml LV SV MOD A2C:     53.7 ml LV SV MOD A4C:     186.0 ml LV SV MOD BP:      65.3 ml RIGHT VENTRICLE             IVC RV Basal diam:  3.30 cm     IVC diam: 1.40 cm RV S prime:     13.00 cm/s TAPSE (M-mode): 1.2 cm LEFT ATRIUM             Index LA diam:        4.30 cm 2.44 cm/m LA Vol (A2C):   44.5 ml 25.25 ml/m LA Vol (A4C):   56.1 ml 31.84 ml/m LA Biplane Vol: 55.2 ml 31.33 ml/m  AORTIC VALVE LVOT Vmax:   110.00 cm/s LVOT Vmean:  69.200 cm/s LVOT VTI:    0.194 m  AORTA Ao Root diam: 3.60 cm Ao Asc diam:  3.40 cm TRICUSPID VALVE TR Peak grad:   8.4 mmHg TR Vmax:        145.00 cm/s  SHUNTS Systemic VTI:  0.19 m Systemic Diam: 2.30 cm Dalton McleanMD Electronically signed by Ezra Kanner Signature Date/Time: 02/25/2024/4:14:32 PM    Final     Scheduled Meds:  enoxaparin  (LOVENOX ) injection  40 mg Subcutaneous Daily   insulin  aspart  0-5 Units Subcutaneous QHS   insulin  aspart  0-9 Units Subcutaneous TID WC   levothyroxine   300 mcg Oral Q0600   losartan  12.5 mg Oral Daily   nitrofurantoin  (macrocrystal-monohydrate)  100 mg Oral BID   oseltamivir   30 mg Oral BID   QUEtiapine   25 mg Oral QHS   Continuous Infusions:   Unresulted Labs (From admission, onward)     Start     Ordered   03/01/24 0500  Creatinine, serum  (enoxaparin  (LOVENOX )    CrCl >/= 30 ml/min)  Weekly,    R     Comments: while on enoxaparin  therapy    02/23/24 0014   02/27/24 0500  Phosphorus  Tomorrow morning,   R        02/26/24 2308   02/27/24 0500  Basic metabolic panel with GFR  Tomorrow morning,   R        02/26/24 2309             LOS:  LOS: 3 days   Time Spent: 35 minutes  Amenah Tucci Al-Sultani, MD [facility]  If 7PM-7AM, please contact night-coverage  02/26/2024, 10:59 PM      "

## 2024-02-26 NOTE — Progress Notes (Signed)
 Heart Failure Navigator Progress Note  Assessed for Heart & Vascular TOC clinic readiness.  Patient does not meet criteria due to per MD she has a history of Dementia. No HF TOC. .   Navigator will sign off at this time.   Stephane Haddock, BSN, Scientist, Clinical (histocompatibility And Immunogenetics) Only

## 2024-02-27 LAB — BASIC METABOLIC PANEL WITH GFR
Anion gap: 10 (ref 5–15)
BUN: 10 mg/dL (ref 8–23)
CO2: 21 mmol/L — ABNORMAL LOW (ref 22–32)
Calcium: 9 mg/dL (ref 8.9–10.3)
Chloride: 102 mmol/L (ref 98–111)
Creatinine, Ser: 0.79 mg/dL (ref 0.44–1.00)
GFR, Estimated: >60 mL/min
Glucose, Bld: 125 mg/dL — ABNORMAL HIGH (ref 70–99)
Potassium: 3.7 mmol/L (ref 3.5–5.1)
Sodium: 132 mmol/L — ABNORMAL LOW (ref 135–145)

## 2024-02-27 LAB — GLUCOSE, CAPILLARY
Glucose-Capillary: 110 mg/dL — ABNORMAL HIGH (ref 70–99)
Glucose-Capillary: 106 mg/dL — ABNORMAL HIGH (ref 70–99)
Glucose-Capillary: 119 mg/dL — ABNORMAL HIGH (ref 70–99)
Glucose-Capillary: 123 mg/dL — ABNORMAL HIGH (ref 70–99)

## 2024-02-27 LAB — PHOSPHORUS: Phosphorus: 2.6 mg/dL (ref 2.5–4.6)

## 2024-02-27 MED ORDER — ALBUMIN HUMAN 25 % IV SOLN
25.0000 g | INTRAVENOUS | Status: AC
  Administered 2024-02-27: 25 g via INTRAVENOUS
  Filled 2024-02-27: qty 100

## 2024-02-27 MED ORDER — LACTATED RINGERS IV BOLUS
250.0000 mL | Freq: Once | INTRAVENOUS | Status: AC
  Administered 2024-02-27: 250 mL via INTRAVENOUS

## 2024-02-27 NOTE — TOC Progression Note (Signed)
 Transition of Care [facility]) - Progression Note    Patient Details  Name: Michele Dawson MRN: [id number] Date of Birth: 02/09/1941  Transition of Care [facility]) CM/SW Contact  Luise JAYSON Pan, [place] Phone Number: 02/27/2024, 9:41 AM  Clinical Narrative:   MD inquired with CSW about patient discharging tomorrow to [facility]. CSW reached out to Pleasant Rosella at [facility] about patient discharging tomorrow. Ms. Rosella stated that is okay as long as they get DC summary. Per Ms. Rosella, admissions will be back in office tomorrow. CSW informed MD.  CSW will continue to follow.    Expected Discharge Plan: Assisted Living Barriers to Discharge: Continued Medical Work up               Expected Discharge Plan and Services In-house Referral: Clinical Social Work     Living arrangements for the past 2 months: Assisted Living Facility                                       Social Drivers of Health (SDOH) Interventions SDOH Screenings   Food Insecurity: No Food Insecurity (02/25/2024)  Housing: Low Risk (02/25/2024)  Transportation Needs: No Transportation Needs (02/25/2024)  Utilities: Not At Risk (02/25/2024)  Social Connections: Patient Unable To Answer (02/24/2024)  Tobacco Use: Low Risk (02/24/2024)    Readmission Risk Interventions     No data to display

## 2024-02-27 NOTE — Progress Notes (Signed)
 " PROGRESS NOTE    Michele Dawson  FMW:[id number] DOB: [birth year] DOA: 02/22/2024 PCP: Cleotilde Planas, MD    Brief Narrative:   84 y.o. female with medical history significant for hypertension, prediabetes, hyperlipidemia, hypothyroidism, who presents to the ER from SNF due to altered mental status for the last 2 days.  Associated with generalized weakness.  The patient was found in another resident's room.  Reportedly, she is new at the facility and has been there for 2 days.  EMS was activated.  Upon EMS arrival, the patient was hypotensive with SBP of 72 and DBP of 44.  Was noted to have cyanosis around her lips and was placed on 4 L nasal cannula to get her sats above 90%.  She received roughly 300 cc normal saline of IV fluid en route via EMS. In the ER, alert and confused. Temperature 97.1.  BP 98/73, pulse 59, respiration rate 19, O2 saturation 99% on 4 L nasal cannula. Influenza A positive.  Noncontrast head CT negative for any acute intracranial abnormality.  Chest x-ray was nonacute. She was admitted for AMS in the setting of acute influenza A infection   Assessment and Plan:  Influenza A infection Tamiflu  30 mg twice daily x 5 days. DuoNebs every 6 hours for wheezing and shortness of breath As needed antitussives  Acute cystitis UA: LE moderate, nitrate negative, bacteria few, WBC 11-20 12/29 c/o dysuria No urine culture collected Continue Macrobid  100 mg p.o. twice daily for 5 days  Acute metabolic encephalopathy secondary to the above Continue to treat underlying conditions Reorient as needed Fall precautions.  AKI, prerenal in the setting of dehydration Baseline creatinine 0.77 with GFR greater than 60 Presented with creatinine 1.13 with GFR 48 Resolved with IVFs Encourage oral intake Cr stable  Bradycardia Cardiology following, reported no concerns at this time as patient is asymptomatic Continue monitor on telemetry  Cardiomyopathy TTE LVEF 30%, global  hypokinesis, mild concentric LVH grade 2 DD Cardiology  No signs/symptoms of overt heart failure Cardiology following, deferring ischemic workup at this time due to mental status, recommending conservative management with follow up echos Avoiding SGLT2i given UTIs Discontinued Losartan 12.5 mg given hypotension overnight  Hypotension BP dropped to 86/61 overnight Received LR 250 mL bolus and IV Albumin 25% 25g This was in the setting of starting Losartan yesterday, now discontinued   Respiratory distress in the setting of influenza A infection - resolved Reportedly the patient was noted with cyanosis around her lips and was placed on 4 L nasal cannula to maintain her O2 saturation above 90%. 12/29 respiratory failure resolved, currently saturating well on room air   Hypovolemic hyponatremia: Resolved Serum sodium 132>135 Hold off home hydrochlorothiazide Corrected with IVFs Encourage oral intake   Hypophosphatemia due to nutritional deficiency Phos repleted.  Hypokalemia, mild, K+ repleted Monitor electrolytes daily and replete as needed.   Hypothyroidism Continue  home levothyroxine .   Hypertension Home oral antihypertensive initially held due to soft BPs BP improving Cardiology started Losartan 12.5 mg  Monitor BP   Hyperlipidemia: off stain   Prediabetes with hyperglycemia Hgb A1c 6.5 (12/28) Insulin  sliding scale with hyperglycemia  Generalized weakness PT OT evaluated patient, recommended SNF Fall precaution   Scheduled Meds:  enoxaparin  (LOVENOX ) injection  40 mg Subcutaneous Daily   insulin  aspart  0-5 Units Subcutaneous QHS   insulin  aspart  0-9 Units Subcutaneous TID WC   levothyroxine   300 mcg Oral Q0600   nitrofurantoin  (macrocrystal-monohydrate)  100 mg Oral BID  QUEtiapine   25 mg Oral QHS   Continuous Infusions: PRN Meds:.acetaminophen , guaiFENesin -dextromethorphan, ipratropium-albuterol , ondansetron  (ZOFRAN ) IV, mouth rinse  Current Outpatient  Medications  Medication Instructions   levothyroxine  (SYNTHROID ) 300 mcg, Oral, Daily   lisinopril  (ZESTRIL ) 20 mg, Oral, Daily   QUEtiapine  (SEROQUEL ) 25 mg, Oral, Daily at bedtime    DVT prophylaxis: enoxaparin  (LOVENOX ) injection 40 mg Start: 02/23/24 1000   Code Status:   Code Status: Limited: Do not attempt resuscitation (DNR) -DNR-LIMITED -Do Not Intubate/DNI   Family Communication: None  Disposition Plan: Will likely return to ALF tomorrow if BP remains stable  PT -   -   OT -   -    Level of care: Telemetry  Consultants:  Cardiology  Procedures:  None  Antimicrobials: Macrobid    Subjective: Had episode of hypotension to 86/61 overnight requiring LR 250 mL bolus and 25 g of IV Albumin 25%. BP improved. Patient oriented to person only. At baseline mentation this morning without acute concerns.   Objective: Vitals:   02/27/24 1025 02/27/24 1302 02/27/24 1623 02/27/24 2002  BP: 112/80 102/80 118/74 109/78  Pulse: 86  70 65  Resp: 19 16 18 18   Temp: 97.8 F (36.6 C)  98 F (36.7 C) 97.8 F (36.6 C)  TempSrc: Oral  Oral Oral  SpO2:   94% 96%  Weight:      Height:        Intake/Output Summary (Last 24 hours) at 02/27/2024 2238 Last data filed at 02/27/2024 1624 Gross per 24 hour  Intake 236 ml  Output 0 ml  Net 236 ml   Filed Weights   02/25/24 1500  Weight: 67.4 kg    Examination:  Gen: NAD, A&Ox 1 (perosn) HEENT: NCAT Neck: Supple, CV: RRR, no murmurs Resp: normal WOB, CTAB, no w/r/r Abd: Soft, NTND, no guarding, BS normoactive Ext: No LE edema Skin: Warm, dry, no rashes/lesions Neuro: Could not adequately assess given mental status    Data Reviewed: I have personally reviewed following labs and imaging studies  CBC: Recent Labs  Lab 02/22/24 2002 02/23/24 0145 02/23/24 0151 02/24/24 0319  WBC 7.0 8.3  --  5.9  HGB 13.3 13.0 13.6 11.8*  HCT 41.1 39.5 40.0 35.3*  MCV 94.5 92.3  --  90.7  PLT 251 268  --  263   Basic Metabolic  Panel: Recent Labs  Lab 02/22/24 2002 02/23/24 0145 02/23/24 0151 02/24/24 0319 02/25/24 0330 02/27/24 0214  NA 132*  --  137 135 135 132*  K 3.9  --  3.7 3.4* 3.5 3.7  CL 100  --   --  101 104 102  CO2 20*  --   --  22 20* 21*  GLUCOSE 168*  --   --  105* 101* 125*  BUN 20  --   --  16 16 10   CREATININE 1.13* 1.14*  --  0.89 0.97 0.79  CALCIUM  8.8*  --   --  8.9 8.8* 9.0  MG  --   --   --  2.1 2.0  --   PHOS  --   --   --  1.7* 2.0* 2.6   GFR: Estimated Creatinine Clearance: 50.8 mL/min (by C-G formula based on SCr of 0.79 mg/dL). Liver Function Tests: Recent Labs  Lab 02/22/24 2002  AST 35  ALT 7  ALKPHOS 57  BILITOT 0.6  PROT 6.8  ALBUMIN 4.0   No results for input(s): LIPASE, AMYLASE in the last 168 hours. No results for  input(s): AMMONIA in the last 168 hours. Coagulation Profile: No results for input(s): INR, PROTIME in the last 168 hours. Cardiac Enzymes: No results for input(s): CKTOTAL, CKMB, CKMBINDEX, TROPONINI in the last 168 hours. BNP (last 3 results) No results for input(s): PROBNP in the last 8760 hours. HbA1C: No results for input(s): HGBA1C in the last 72 hours. CBG: Recent Labs  Lab 02/26/24 1946 02/27/24 0749 02/27/24 1220 02/27/24 1627 02/27/24 2004  GLUCAP 169* 110* 106* 119* 123*   Lipid Profile: No results for input(s): CHOL, HDL, LDLCALC, TRIG, CHOLHDL, LDLDIRECT in the last 72 hours. Thyroid  Function Tests: No results for input(s): TSH, T4TOTAL, FREET4, T3FREE, THYROIDAB in the last 72 hours. Anemia Panel: No results for input(s): VITAMINB12, FOLATE, FERRITIN, TIBC, IRON, RETICCTPCT in the last 72 hours. Sepsis Labs: No results for input(s): PROCALCITON, LATICACIDVEN in the last 168 hours.  Recent Results (from the past 240 hours)  Resp panel by RT-PCR (RSV, Flu A&B, Covid) Anterior Nasal Swab     Status: Abnormal   Collection Time: 02/22/24  8:37 PM   Specimen:  Anterior Nasal Swab  Result Value Ref Range Status   SARS Coronavirus 2 by RT PCR NEGATIVE NEGATIVE Final   Influenza A by PCR POSITIVE (A) NEGATIVE Final   Influenza B by PCR NEGATIVE NEGATIVE Final    Comment: (NOTE) The Xpert Xpress SARS-CoV-2/FLU/RSV plus assay is intended as an aid in the diagnosis of influenza from Nasopharyngeal swab specimens and should not be used as a sole basis for treatment. Nasal washings and aspirates are unacceptable for Xpert Xpress SARS-CoV-2/FLU/RSV testing.  Fact Sheet for Patients: bloggercourse.com  Fact Sheet for Healthcare Providers: seriousbroker.it  This test is not yet approved or cleared by the [place]  FDA and has been authorized for detection and/or diagnosis of SARS-CoV-2 by FDA under an Emergency Use Authorization (EUA). This EUA will remain in effect (meaning this test can be used) for the duration of the COVID-19 declaration under Section 564(b)(1) of the Act, 21 U.S.C. section 360bbb-3(b)(1), unless the authorization is terminated or revoked.     Resp Syncytial Virus by PCR NEGATIVE NEGATIVE Final    Comment: (NOTE) Fact Sheet for Patients: bloggercourse.com  Fact Sheet for Healthcare Providers: seriousbroker.it  This test is not yet approved or cleared by the [place]  FDA and has been authorized for detection and/or diagnosis of SARS-CoV-2 by FDA under an Emergency Use Authorization (EUA). This EUA will remain in effect (meaning this test can be used) for the duration of the COVID-19 declaration under Section 564(b)(1) of the Act, 21 U.S.C. section 360bbb-3(b)(1), unless the authorization is terminated or revoked.  Performed at [facility], [address], [place], [place] [address]   MRSA Next Gen by PCR, Nasal     Status: None   Collection Time: 02/24/24 12:59 AM   Specimen: Nasal Mucosa; Nasal Swab   Result Value Ref Range Status   MRSA by PCR Next Gen NOT DETECTED NOT DETECTED Final    Comment: (NOTE) The GeneXpert MRSA Assay (FDA approved for NASAL specimens only), is one component of a comprehensive MRSA colonization surveillance program. It is not intended to diagnose MRSA infection nor to guide or monitor treatment for MRSA infections. Test performance is not FDA approved in patients less than 95 years old. Performed at [facility], [address], [place], [place] [address]      Radiology Studies: No results found.   Scheduled Meds:  enoxaparin  (LOVENOX ) injection  40 mg  Subcutaneous Daily   insulin  aspart  0-5 Units Subcutaneous QHS   insulin  aspart  0-9 Units Subcutaneous TID WC   levothyroxine   300 mcg Oral Q0600   nitrofurantoin  (macrocrystal-monohydrate)  100 mg Oral BID   QUEtiapine   25 mg Oral QHS   Continuous Infusions:   Unresulted Labs (From admission, onward)     Start     Ordered   03/01/24 0500  Creatinine, serum  (enoxaparin  (LOVENOX )    CrCl >/= 30 ml/min)  Weekly,   R     Comments: while on enoxaparin  therapy    02/23/24 0014             LOS:  LOS: 4 days   Time Spent: 35 minutes  Jaspreet Bodner Al-Sultani, MD [facility]  If 7PM-7AM, please contact night-coverage  02/27/2024, 10:38 PM      "

## 2024-02-28 DIAGNOSIS — F03B0 Unspecified dementia, moderate, without behavioral disturbance, psychotic disturbance, mood disturbance, and anxiety: Secondary | ICD-10-CM

## 2024-02-28 DIAGNOSIS — I959 Hypotension, unspecified: Secondary | ICD-10-CM

## 2024-02-28 DIAGNOSIS — I429 Cardiomyopathy, unspecified: Secondary | ICD-10-CM

## 2024-02-28 DIAGNOSIS — N179 Acute kidney failure, unspecified: Secondary | ICD-10-CM

## 2024-02-28 DIAGNOSIS — J101 Influenza due to other identified influenza virus with other respiratory manifestations: Secondary | ICD-10-CM | POA: Diagnosis not present

## 2024-02-28 DIAGNOSIS — R0603 Acute respiratory distress: Secondary | ICD-10-CM

## 2024-02-28 DIAGNOSIS — N3000 Acute cystitis without hematuria: Secondary | ICD-10-CM | POA: Insufficient documentation

## 2024-02-28 DIAGNOSIS — G9341 Metabolic encephalopathy: Secondary | ICD-10-CM

## 2024-02-28 DIAGNOSIS — I498 Other specified cardiac arrhythmias: Secondary | ICD-10-CM

## 2024-02-28 LAB — GLUCOSE, CAPILLARY
Glucose-Capillary: 118 mg/dL — ABNORMAL HIGH (ref 70–99)
Glucose-Capillary: 123 mg/dL — ABNORMAL HIGH (ref 70–99)

## 2024-02-28 MED ORDER — NITROFURANTOIN MONOHYD MACRO 100 MG PO CAPS: 100.0000 mg | ORAL_CAPSULE | Freq: Two times a day (BID) | ORAL | Status: AC

## 2024-02-28 NOTE — TOC Transition Note (Addendum)
 Transition of Care [facility]) - Discharge Note   Patient Details  Name: Michele Dawson MRN: [id number] Date of Birth: [birth year]  [facility] CM/SW Contact:  Lendia Dais, LCSWA Phone Number: 02/28/2024, 12:09 PM   Clinical Narrative:  Pt is returning to [facility]. RN report to [phone]. CSW faxed DC summary to [phone].  CSW spoke to Arlean (daughter) via phone who stated they can provide transportation to the ALF. CSW informed Arlean that the pt is going to the DC lounge.  No further TOC needs.    Final next level of care: Assisted Living Barriers to Discharge: Barriers Resolved   Patient Goals and CMS Choice Patient states their goals for this hospitalization and ongoing recovery are:: pt disoriented x3          Discharge Placement              Patient chooses bed at: Other - please specify in the comment section below: [facility]) Patient to be transferred to facility by: Family Name of family member notified: Arlean (daughter) Patient and family notified of of transfer: 02/28/24  Discharge Plan and Services Additional resources added to the After Visit Summary for   In-house Referral: Clinical Social Work                                   Social Drivers of Health (SDOH) Interventions SDOH Screenings   Food Insecurity: No Food Insecurity (02/25/2024)  Housing: Low Risk (02/25/2024)  Transportation Needs: No Transportation Needs (02/25/2024)  Utilities: Not At Risk (02/25/2024)  Social Connections: Patient Unable To Answer (02/24/2024)  Tobacco Use: Low Risk (02/24/2024)     Readmission Risk Interventions     No data to display

## 2024-02-28 NOTE — Discharge Summary (Signed)
 " Physician Discharge Summary   Patient: Michele Dawson MRN: [id number] DOB: 1940/04/23  Admit date:     02/22/2024  Discharge date: 02/28/2024  Discharge Physician: Duffy Al-Sultani   PCP: Cleotilde Planas, MD   Recommendations at discharge:   Follow up with PCP within 1 week of discharge - repeat lab work, monitor K, Phos, Mg, creatinine  Keep a twice daily blood pressure log to monitor trend in blood pressure and assess for appropriateness of resumption of antihypertensives Follow up with cardiology as scheduled by the cardiology team  Discharge Diagnoses: Principal Problem:   Influenza A Active Problems:   Hypothyroidism   Acute cystitis   Hypotension   Cardiomyopathy (HCC)   Sinus arrhythmia seen on electrocardiogram   Moderate dementia (HCC)  Resolved Problems:   Respiratory distress   Acute metabolic encephalopathy   Hypophosphatasia   AKI (acute kidney injury)  Hospital Course: The patient is an 84 year old female with PMHx of moderate dementia, T2DM, HTN, CKD stage 3a, hypothyroidism, who presented from [facility] to the ED on 02/22/2024 due to altered mental status. The patient was a new resident at the facility and was found with altered mental status and lethargy in another resident's room. On EMS arrival, the patient was noted to be lethargic, hypotensive to 72/44, with circumoral cyanosis, was placed on 4L O2 to maintain SpO2> 90% and given a 300 mL NS bolus.   In the ED, she was noted to be alert, conversational, but confused. She was afebrile with a temp of 95.4 F, RR 20, HR 76, BP 104/7, SpO2 100% on 4L [place]. CBC was unremarkable on arrival. Na 133 (corrected), bicarb 20, Cr 1.13. 4-plex PCR was positive for Influenza A.   CXR showed no acute cardiopulmonary abnormalities. CT head showed no acute intracranial abnormalities, noting age-related atrophy and chronic small vessel ischemic changes.    She was admitted for acute encephalopathy in the setting  of influenza A infection.   # Acute metabolic encephalopathy in the setting of underlying moderate dementia - This was thought to be secondary to influenza A infection and possibly acute cystitis complicated by the patient's underlying moderate dementia - She is currently awake, alert, and oriented to person only. This appears to be close to but not exactly her prior baseline. She is confused and will make Suspect an element of delirium which may take a bit of time to improve given her dementia.  - Recommend close follow up with PCP to continue monitoring for improvement  # Influenza A - Completed 5 days of Tamiflu  - No signs of residual respiratory distress, SOB, or wheezing on exam  # Acute cystitis - UA on admission showed moderate LE, few bacteria, WBC 11-20, with 11-20 squamous epithelial cells concerning for contamination - No urine culture was sent - However, the patient complained of dysuria the day after admission and was started on Macrobid  100 mg BID. She will need 2 more doses at the time of discharge to complete 5-day course.   # Respiratory distress in the setting of influenza A infection - resolved - Per EMS's report, patient had circumoral cyanosis and was placed on 4L [place] to keep SpO2 > 90%. On presentation, SpO2 was 100% on 4L. She was weaned off oxygen. Prior to discharge, ambulatory SpO2 were checked, the patient walked 100 ft with SpO2 starting at 96% at rest and dropping to 91% on RA.   # AKI - resolved - Presented with Cr 1.13, baseline Cr 0.77 -  Resolved with gentle IVFs - Cr 0.79 at time of discharge  # Sinus arrhythmia - Cardiology was consulted for EKG showing sinus arrhythmia with a rate of 62.  Cardiology indicated that she is in sinus rhythm, sinus bradycardia, with Mobitz type I, nonconducted PACs.  No evidence of A-fib.  Recommend continue telemetry as patient has no symptoms related to bradycardia today.  #Cardiomyopathy - TTE on 02/25/2024 showed LVEF 30%,  global hypokinesis, mild concentric LVH, mildly reduced RV systolic function, G2 DD, moderate aortic calcification, mild AR - She has not no notable overt symptoms of heart failure and appears euvolemic on exam - Cardiology deferred any ischemic workup given her mental status, reporting this could be reconsidered at a different time when the patient is able to participate in the evaluation - Patient's low blood pressures and UTI restrict ability to initiate GDMT - Losartan 12.5 mg was started on 02/26/2024 but the patient became hypotensive to 86/61 - Discussed with cardiology plans to discontinue losartan, fortunately were agreeable to, indicating this can be titrated on outpatient basis - Cardiology team to arrange for outpatient follow-up  #Hypotension - resolved #Primary hypertension by history - Patient presented with hypotension on admission but improved with IV fluids.  She remained with low normal blood pressures without any antihypertensive medications.  She was briefly started on losartan on 12/31 with subsequent drop in blood pressure to 86/61 necessitating IVF bolus and IV albumin.  As such, no antihypertensives will be continued at time of discharge.  Patient will benefit from monitoring of blood pressure twice daily.  Cardiology or PCP can determine appropriateness of starting antihypertensive based on blood pressure trends.  # Hypophosphatemia - Repleted and resolved     Consultants: Cardiology Procedures performed: None  Disposition: Assisted living Diet recommendation:  Diet Orders (From admission, onward)     Start     Ordered   02/23/24 0017  Diet heart healthy/carb modified Room service appropriate? Yes; Fluid consistency: Thin  Diet effective now       Question Answer Comment  Diet-HS Snack? Nothing   Room service appropriate? Yes   Fluid consistency: Thin      02/23/24 0016            DISCHARGE MEDICATION: Allergies as of 02/28/2024       Reactions    Shellfish Allergy Hives   Sulfa Antibiotics Hives   blotches   Penicillins Rash           Medication List     STOP taking these medications    lisinopril  20 MG tablet Commonly known as: ZESTRIL        TAKE these medications    levothyroxine  300 MCG tablet Commonly known as: SYNTHROID  Take 300 mcg by mouth daily.   nitrofurantoin  (macrocrystal-monohydrate) 100 MG capsule Commonly known as: MACROBID  Take 1 capsule (100 mg total) by mouth 2 (two) times daily for 1 day.   QUEtiapine  25 MG tablet Commonly known as: SEROQUEL  Take 25 mg by mouth at bedtime.         Discharge Exam: Filed Weights   02/25/24 1500  Weight: 67.4 kg   Blood pressure 109/78, pulse 65, temperature 97.8 F (36.6 C), temperature source Oral, resp. rate 18, height 5' 6 (1.676 m), weight 67.4 kg, SpO2 96%.   Gen: NAD, pleasant HEENT: NCAT Neck: Supple, CV: RRR, no murmurs Resp: normal WOB, CTAB, no w/r/r Abd: Soft, NTND, no guarding, BS normoactive Ext: No LE edema Skin: Warm, dry, no rashes/lesions Neuro: Awake,  alert, oriented only to person. No dysarthria or aphasia. Moves all 4 extremities spontaneously with no appreciable weakness, no focal deficits.   Condition at discharge: fair  The results of significant diagnostics from this hospitalization (including imaging, microbiology, ancillary and laboratory) are listed below for reference.   Imaging Studies: ECHOCARDIOGRAM COMPLETE Result Date: 02/25/2024    ECHOCARDIOGRAM REPORT   Patient Name:   AZYIAH BO Date of Exam: 02/25/2024 Medical Rec #:  [id number]        Height:       66.0 in Accession #:    [id number]       Weight:       148.5 lb Date of Birth:  24-Oct-1940        BSA:          1.762 m Patient Age:    83 years         BP:           96/73 mmHg Patient Gender: F                HR:           80 bpm. Exam Location:  Inpatient Procedure: 2D Echo, Color Doppler and Cardiac Doppler (Both Spectral and Color            Flow  Doppler were utilized during procedure). Indications:    Abnormal ECG R94.31  History:        Patient has no prior history of Echocardiogram examinations.                 Abnormal ECG; Risk Factors:Dyslipidemia, Hypertension, Diabetes                 and GERD.  Sonographer:    Koleen Popper RDCS Referring Phys: [id number] [facility]  Sonographer Comments: Suboptimal parasternal window. Image acquisition challenging due to patient body habitus. IMPRESSIONS  1. Left ventricular ejection fraction, by estimation, is 30%. The left ventricle has moderate to severely decreased function. The left ventricle demonstrates global hypokinesis. There is mild concentric left ventricular hypertrophy. Left ventricular diastolic parameters are consistent with Grade II diastolic dysfunction (pseudonormalization).  2. Right ventricular systolic function is mildly reduced. The right ventricular size is normal. Tricuspid regurgitation signal is inadequate for assessing PA pressure.  3. Left atrial size was mildly dilated.  4. The mitral valve is degenerative. Trivial mitral valve regurgitation. No evidence of mitral stenosis. Moderate mitral annular calcification.  5. The aortic valve was not well visualized. There is moderate calcification of the aortic valve. Aortic valve regurgitation is mild. Aortic valve sclerosis/calcification is present, without any evidence of aortic stenosis.  6. The inferior vena cava is normal in size with greater than 50% respiratory variability, suggesting right atrial pressure of 3 mmHg. FINDINGS  Left Ventricle: Left ventricular ejection fraction, by estimation, is 30%. The left ventricle has moderate to severely decreased function. The left ventricle demonstrates global hypokinesis. The left ventricular internal cavity size was normal in size. There is mild concentric left ventricular hypertrophy. Left ventricular diastolic parameters are consistent with Grade II diastolic dysfunction  (pseudonormalization). Right Ventricle: The right ventricular size is normal. No increase in right ventricular wall thickness. Right ventricular systolic function is mildly reduced. Tricuspid regurgitation signal is inadequate for assessing PA pressure. The tricuspid regurgitant velocity is 1.45 m/s, and with an assumed right atrial pressure of 3 mmHg, the estimated right ventricular systolic pressure is 11.4 mmHg. Left Atrium: Left atrial size was mildly dilated. Right  Atrium: Right atrial size was normal in size. Pericardium: There is no evidence of pericardial effusion. Mitral Valve: The mitral valve is degenerative in appearance. There is moderate calcification of the mitral valve leaflet(s). Moderate mitral annular calcification. Trivial mitral valve regurgitation. No evidence of mitral valve stenosis. Tricuspid Valve: The tricuspid valve is normal in structure. Tricuspid valve regurgitation is trivial. Aortic Valve: The aortic valve was not well visualized. There is moderate calcification of the aortic valve. Aortic valve regurgitation is mild. Aortic valve sclerosis/calcification is present, without any evidence of aortic stenosis. Pulmonic Valve: The pulmonic valve was normal in structure. Pulmonic valve regurgitation is not visualized. Aorta: The aortic root is normal in size and structure. Venous: The inferior vena cava is normal in size with greater than 50% respiratory variability, suggesting right atrial pressure of 3 mmHg. IAS/Shunts: No atrial level shunt detected by color flow Doppler.  LEFT VENTRICLE PLAX 2D LVIDd:         4.90 cm      Diastology LVIDs:         3.10 cm      LV e' medial:  2.75 cm/s LV PW:         1.00 cm      LV e' lateral: 9.56 cm/s LV IVS:        1.40 cm LVOT diam:     2.30 cm LV SV:         81 LV SV Index:   46 LVOT Area:     4.15 cm  LV Volumes (MOD) LV vol d, MOD A2C: 138.0 ml LV vol d, MOD A4C: 186.0 ml LV vol s, MOD A2C: 84.3 ml LV vol s, MOD A4C: 110.0 ml LV SV MOD A2C:      53.7 ml LV SV MOD A4C:     186.0 ml LV SV MOD BP:      65.3 ml RIGHT VENTRICLE             IVC RV Basal diam:  3.30 cm     IVC diam: 1.40 cm RV S prime:     13.00 cm/s TAPSE (M-mode): 1.2 cm LEFT ATRIUM             Index LA diam:        4.30 cm 2.44 cm/m LA Vol (A2C):   44.5 ml 25.25 ml/m LA Vol (A4C):   56.1 ml 31.84 ml/m LA Biplane Vol: 55.2 ml 31.33 ml/m  AORTIC VALVE LVOT Vmax:   110.00 cm/s LVOT Vmean:  69.200 cm/s LVOT VTI:    0.194 m  AORTA Ao Root diam: 3.60 cm Ao Asc diam:  3.40 cm TRICUSPID VALVE TR Peak grad:   8.4 mmHg TR Vmax:        145.00 cm/s  SHUNTS Systemic VTI:  0.19 m Systemic Diam: 2.30 cm Dalton McleanMD Electronically signed by Ezra Kanner Signature Date/Time: 02/25/2024/4:14:32 PM    Final    CT Head Wo Contrast Result Date: 02/23/2024 EXAM: CT HEAD WITHOUT CONTRAST 02/22/2024 11:33:25 PM TECHNIQUE: CT of the head was performed without the administration of intravenous contrast. Automated exposure control, iterative reconstruction, and/or weight based adjustment of the mA/kV was utilized to reduce the radiation dose to as low as reasonably achievable. COMPARISON: None available. CLINICAL HISTORY: Mental status change, unknown cause FINDINGS: BRAIN AND VENTRICLES: No acute hemorrhage. No evidence of acute infarct. No hydrocephalus. No extra-axial collection. No mass effect or midline shift. Age-related atrophy. Periventricular and deep white matter hypodensity consistent with  chronic small vessel ischemia. ORBITS: No acute abnormality. SINUSES: Mild mucosal thickening of ethmoid air cells. Fluid opacification of lower left mastoid air cells without superimposed osseous erosion. SOFT TISSUES AND SKULL: No acute soft tissue abnormality. No skull fracture. Atherosclerosis of skullbase vasculature without hyperdense vessel or abnormal calcification. IMPRESSION: 1. No acute intracranial abnormality. 2. Age related atrophy and chronic small vessel ischemic changes . Electronically signed  by: Dorethia Molt MD 02/23/2024 12:23 AM EST RP Workstation: [id number]   DG Chest Port 1 View Result Date: 02/22/2024 EXAM: 1 VIEW(S) XRAY OF THE CHEST 02/22/2024 08:34:20 PM COMPARISON: Chest x-ray 01/13/2020, CT chest 07/06/2020. CLINICAL HISTORY: weak FINDINGS: LIMITATIONS/ARTIFACTS: The patient is rotated. LUNGS AND PLEURA: Low lung volumes. No focal pulmonary opacity. No pleural effusion. No pneumothorax. HEART AND MEDIASTINUM: Widening of the mediastinum due to rotation. BONES AND SOFT TISSUES: Bilateral shoulder degenerative changes. IMPRESSION: 1. No acute cardiopulmonary abnormality. Electronically signed by: Morgane Naveau MD 02/22/2024 09:23 PM EST RP Workstation: [id number]    Microbiology: Results for orders placed or performed during the hospital encounter of 02/22/24  Resp panel by RT-PCR (RSV, Flu A&B, Covid) Anterior Nasal Swab     Status: Abnormal   Collection Time: 02/22/24  8:37 PM   Specimen: Anterior Nasal Swab  Result Value Ref Range Status   SARS Coronavirus 2 by RT PCR NEGATIVE NEGATIVE Final   Influenza A by PCR POSITIVE (A) NEGATIVE Final   Influenza B by PCR NEGATIVE NEGATIVE Final    Comment: (NOTE) The Xpert Xpress SARS-CoV-2/FLU/RSV plus assay is intended as an aid in the diagnosis of influenza from Nasopharyngeal swab specimens and should not be used as a sole basis for treatment. Nasal washings and aspirates are unacceptable for Xpert Xpress SARS-CoV-2/FLU/RSV testing.  Fact Sheet for Patients: bloggercourse.com  Fact Sheet for Healthcare Providers: seriousbroker.it  This test is not yet approved or cleared by the [place]  FDA and has been authorized for detection and/or diagnosis of SARS-CoV-2 by FDA under an Emergency Use Authorization (EUA). This EUA will remain in effect (meaning this test can be used) for the duration of the COVID-19 declaration under Section 564(b)(1) of the Act, 21  U.S.C. section 360bbb-3(b)(1), unless the authorization is terminated or revoked.     Resp Syncytial Virus by PCR NEGATIVE NEGATIVE Final    Comment: (NOTE) Fact Sheet for Patients: bloggercourse.com  Fact Sheet for Healthcare Providers: seriousbroker.it  This test is not yet approved or cleared by the [place]  FDA and has been authorized for detection and/or diagnosis of SARS-CoV-2 by FDA under an Emergency Use Authorization (EUA). This EUA will remain in effect (meaning this test can be used) for the duration of the COVID-19 declaration under Section 564(b)(1) of the Act, 21 U.S.C. section 360bbb-3(b)(1), unless the authorization is terminated or revoked.  Performed at [facility], [address], [place], [place] [address]   MRSA Next Gen by PCR, Nasal     Status: None   Collection Time: 02/24/24 12:59 AM   Specimen: Nasal Mucosa; Nasal Swab  Result Value Ref Range Status   MRSA by PCR Next Gen NOT DETECTED NOT DETECTED Final    Comment: (NOTE) The GeneXpert MRSA Assay (FDA approved for NASAL specimens only), is one component of a comprehensive MRSA colonization surveillance program. It is not intended to diagnose MRSA infection nor to guide or monitor treatment for MRSA infections. Test performance is not FDA approved in patients less than 16 years old. Performed at [facility],  [address] [place], [place] [address]     Labs: CBC: Recent Labs  Lab 02/22/24 2002 02/23/24 0145 02/23/24 0151 02/24/24 0319  WBC 7.0 8.3  --  5.9  HGB 13.3 13.0 13.6 11.8*  HCT 41.1 39.5 40.0 35.3*  MCV 94.5 92.3  --  90.7  PLT 251 268  --  263   Basic Metabolic Panel: Recent Labs  Lab 02/22/24 2002 02/23/24 0145 02/23/24 0151 02/24/24 0319 02/25/24 0330 02/27/24 0214  NA 132*  --  137 135 135 132*  K 3.9  --  3.7 3.4* 3.5 3.7  CL 100  --   --  101 104 102  CO2 20*  --   --  22 20* 21*  GLUCOSE  168*  --   --  105* 101* 125*  BUN 20  --   --  16 16 10   CREATININE 1.13* 1.14*  --  0.89 0.97 0.79  CALCIUM  8.8*  --   --  8.9 8.8* 9.0  MG  --   --   --  2.1 2.0  --   PHOS  --   --   --  1.7* 2.0* 2.6   Liver Function Tests: Recent Labs  Lab 02/22/24 2002  AST 35  ALT 7  ALKPHOS 57  BILITOT 0.6  PROT 6.8  ALBUMIN 4.0   CBG: Recent Labs  Lab 02/26/24 1946 02/27/24 0749 02/27/24 1220 02/27/24 1627 02/27/24 2004  GLUCAP 169* 110* 106* 119* 123*    Discharge time spent: Time Coordinating Discharge: I spent a total of 35 minutes engaged in face-to-face discussion with the patient and/or caregivers regarding the patients care, assessment, plan, and discharge disposition. Over 50% of this time was dedicated to counseling the patient on the risks and benefits of treatment options and the discharge plan, as well as coordinating post-discharge care.   Signed: Devone Tousley Al-Sultani, MD [facility] 02/28/2024         "

## 2024-02-28 NOTE — Progress Notes (Signed)
 "   Progress Note  Patient Name: Michele Dawson Date of Encounter: 02/28/2024  Primary Cardiologist:   Lynwood Schilling, MD   Subjective   Very confused.  Not reporting any overt symptoms.    Inpatient Medications    Scheduled Meds:  enoxaparin  (LOVENOX ) injection  40 mg Subcutaneous Daily   insulin  aspart  0-5 Units Subcutaneous QHS   insulin  aspart  0-9 Units Subcutaneous TID WC   levothyroxine   300 mcg Oral Q0600   nitrofurantoin  (macrocrystal-monohydrate)  100 mg Oral BID   QUEtiapine   25 mg Oral QHS   Continuous Infusions:  PRN Meds: acetaminophen , guaiFENesin -dextromethorphan, ipratropium-albuterol , ondansetron  (ZOFRAN ) IV, mouth rinse   Vital Signs    Vitals:   02/27/24 1623 02/27/24 2002 02/28/24 0515 02/28/24 0836  BP: 118/74 109/78 93/68 98/65   Pulse: 70 65 (!) 57 (!) 58  Resp: 18 18 18 17   Temp: 98 F (36.7 C) 97.8 F (36.6 C) 97.8 F (36.6 C) 98.1 F (36.7 C)  TempSrc: Oral Oral Oral Oral  SpO2: 94% 96% 97% 94%  Weight:      Height:        Intake/Output Summary (Last 24 hours) at 02/28/2024 1131 Last data filed at 02/28/2024 1059 Gross per 24 hour  Intake 358 ml  Output 0 ml  Net 358 ml   Filed Weights   02/25/24 1500  Weight: 67.4 kg    Telemetry    NA, patient is not on tele - Personally Reviewed  ECG    NA - Personally Reviewed  Physical Exam   GEN: No acute distress.    Labs    Chemistry Recent Labs  Lab 02/22/24 2002 02/23/24 0145 02/24/24 0319 02/25/24 0330 02/27/24 0214  NA 132*   < > 135 135 132*  K 3.9   < > 3.4* 3.5 3.7  CL 100  --  101 104 102  CO2 20*  --  22 20* 21*  GLUCOSE 168*  --  105* 101* 125*  BUN 20  --  16 16 10   CREATININE 1.13*   < > 0.89 0.97 0.79  CALCIUM  8.8*  --  8.9 8.8* 9.0  PROT 6.8  --   --   --   --   ALBUMIN 4.0  --   --   --   --   AST 35  --   --   --   --   ALT 7  --   --   --   --   ALKPHOS 57  --   --   --   --   BILITOT 0.6  --   --   --   --   GFRNONAA 48*   < > >60 58* >60   ANIONGAP 12  --  12 12 10    < > = values in this interval not displayed.     Hematology Recent Labs  Lab 02/22/24 2002 02/23/24 0145 02/23/24 0151 02/24/24 0319  WBC 7.0 8.3  --  5.9  RBC 4.35 4.28  --  3.89  HGB 13.3 13.0 13.6 11.8*  HCT 41.1 39.5 40.0 35.3*  MCV 94.5 92.3  --  90.7  MCH 30.6 30.4  --  30.3  MCHC 32.4 32.9  --  33.4  RDW 15.0 15.1  --  15.1  PLT 251 268  --  263    Cardiac EnzymesNo results for input(s): TROPONINI in the last 168 hours. No results for input(s): TROPIPOC in the last 168 hours.  BNPNo results for input(s): BNP, PROBNP in the last 168 hours.   DDimer No results for input(s): DDIMER in the last 168 hours.   Radiology    No results found.  Cardiac Studies   Echo:  1. Left ventricular ejection fraction, by estimation, is 30%. The left  ventricle has moderate to severely decreased function. The left ventricle  demonstrates global hypokinesis. There is mild concentric left ventricular  hypertrophy. Left ventricular  diastolic parameters are consistent with Grade II diastolic dysfunction  (pseudonormalization).   2. Right ventricular systolic function is mildly reduced. The right  ventricular size is normal. Tricuspid regurgitation signal is inadequate  for assessing PA pressure.   3. Left atrial size was mildly dilated.   4. The mitral valve is degenerative. Trivial mitral valve regurgitation.  No evidence of mitral stenosis. Moderate mitral annular calcification.   5. The aortic valve was not well visualized. There is moderate  calcification of the aortic valve. Aortic valve regurgitation is mild.  Aortic valve sclerosis/calcification is present, without any evidence of  aortic stenosis.   6. The inferior vena cava is normal in size with greater than 50%  respiratory variability, suggesting right atrial pressure of 3 mmHg   Patient Profile     84 y.o. female with a hx of hypertension, type 2 diabetes, hypothyroidism,  hypercholesterolemia, who is being seen 02/25/2024 for the evaluation of abnormal EKG at the request of Von Bellis MD.   Assessment & Plan    Arrhythmias:  Currently not no tele.  No atrial fib this admission.  No change in therapy.  Cardiomyopathy:  Etiology is not clear.  I will manage this conservatively.  Started low dose ARB.  However, BPs were low overnight and this was discontinued.  She seems to be euvolemic.  No other suggestions.  Please call with further questions.   We will sign off.   For questions or updates, please contact [facility] Please consult [email or web address] for contact info under Cardiology/STEMI.   Signed, Lynwood Schilling, MD  02/28/2024, 11:31 AM    "

## 2024-02-28 NOTE — TOC Progression Note (Signed)
 Transition of Care [facility]) - Progression Note    Patient Details  Name: [name] MRN: [id number] Date of Birth: 1940-07-22  [facility] CM/SW Contact  Lendia Dais, [place] Phone Number: 02/28/2024, 11:30 AM  Clinical Narrative:  CSW is actively working discharge. CSW attempted to reach out to [facility] for report via phone and CSW left a VM.  CSW will re-attempt.    Expected Discharge Plan: Assisted Living Barriers to Discharge: Continued Medical Work up               Expected Discharge Plan and Services In-house Referral: Clinical Social Work     Living arrangements for the past 2 months: Assisted Living Facility Expected Discharge Date: 02/28/24                                     Social Drivers of Health (SDOH) Interventions SDOH Screenings   Food Insecurity: No Food Insecurity (02/25/2024)  Housing: Low Risk (02/25/2024)  Transportation Needs: No Transportation Needs (02/25/2024)  Utilities: Not At Risk (02/25/2024)  Social Connections: Patient Unable To Answer (02/24/2024)  Tobacco Use: Low Risk (02/24/2024)    Readmission Risk Interventions     No data to display

## 2024-03-06 ENCOUNTER — Telehealth: Payer: Self-pay

## 2024-03-06 DIAGNOSIS — F03B0 Unspecified dementia, moderate, without behavioral disturbance, psychotic disturbance, mood disturbance, and anxiety: Secondary | ICD-10-CM

## 2024-03-11 ENCOUNTER — Telehealth: Payer: Self-pay | Admitting: *Deleted

## 2024-03-11 NOTE — Progress Notes (Signed)
 Complex Care Management Note Care Guide Note  03/11/2024 Name: Michele Dawson MRN: [id number] DOB: Jan 13, 1941   Complex Care Management Outreach Attempts: An unsuccessful telephone outreach was attempted today to offer the patient information about available complex care management services.  Follow Up Plan:  Additional outreach attempts will be made to offer the patient complex care management information and services.   Encounter Outcome:  No Answer  Michele Dawson  [facility]  [facility], [facility] Guide  Direct Dial: [phone]  Fax [phone]

## 2024-03-13 NOTE — Progress Notes (Unsigned)
 Complex Care Management Care Guide Note  03/13/2024 Name: Michele Dawson MRN: [id number] DOB: Aug 30, 1940  Michele Dawson is a 84 y.o. year old female who is a primary care patient of Cleotilde Planas, MD and is actively engaged with the care management team. I reached out to Dorothyann JINNY Hutchinson by phone today to assist with re-scheduling  with the RN Case Manager.  Follow up plan: Unsuccessful telephone outreach attempt made. A HIPAA compliant phone message was left for the patient providing contact information and requesting a return call.  Harlene Satterfield  [facility]  [facility], [facility] Guide  Direct Dial: [phone]  Fax [phone]

## 2024-03-16 NOTE — Progress Notes (Addendum)
 Complex Care Management Note  Care Guide Note 03/16/2024 Name: [name] MRN: [id number] DOB: [birth year]  Michele Dawson is a 84 y.o. year old female who sees Cleotilde Planas, MD for primary care. I reached out to Michele Dawson daughter Michele Dawson by phone today to offer complex care management services.  Ms. Chirino daughter Michele Dawson was given information about Complex Care Management services today including:   The Complex Care Management services include support from the care team which includes your Nurse Care Manager, Clinical Social Worker, or Pharmacist.  The Complex Care Management team is here to help remove barriers to the health concerns and goals most important to you. Complex Care Management services are voluntary, and the patient may decline or stop services at any time by request to their care team member.   Complex Care Management Consent Status: Patient daughter Michele Dawson agreed to services and verbal consent obtained.   Follow up plan:  Telephone appointment with complex care management team member scheduled for:  1/26 With RNCM and 1/29 with SW   Encounter Outcome:  Patient Scheduled  Harlene Satterfield  [facility]  [facility], [facility] Guide  Direct Dial: [phone]  Fax [phone]

## 2024-03-16 NOTE — Progress Notes (Signed)
 Complex Care Management Note Care Guide Note  03/16/2024 Name: Michele Dawson MRN: [id number] DOB: 03-31-1940   Complex Care Management Outreach Attempts: A third unsuccessful outreach was attempted today to offer the patient with information about available complex care management services.  Follow Up Plan:  No further outreach attempts will be made at this time. We have been unable to contact the patient to offer or enroll patient in complex care management services.  Encounter Outcome:  No Answer  Harlene Satterfield  [facility]  [facility], [facility] Guide  Direct Dial: [phone]  Fax [phone]

## 2024-03-23 ENCOUNTER — Other Ambulatory Visit: Payer: Self-pay

## 2024-03-23 NOTE — Patient Outreach (Signed)
 Call placed to patient's daughter Arlean Schwab for scheduled CCM initial visit. Patient's daughter reports that she received a call from staff at [facility] last week that patient's dementia is beyond what they can provide care for at ALF. Patient will be moved to a memory care unit at their sister facility in [place] until a bed beecomes available at [facility] memory care unit. Patient's daughter reports this move will likely be occurring within the next week.   Consulted with Cena Ligas, LCSW, regarding patient's upcoming move. Due to patient moving to a memory care facility, there are no other services in the county or community that would provide additional support than what she will be receiving. Initial visit with Cena Ligas cancelled, and CMRN initial visit not completed. Patient will not be enrolled in CCM at this time. Patient's daughter verbalizes understanding.  Rosaline Finlay, RN MSN [place]  [facility] Population Health RN Care Manager Direct Dial: [phone]  Fax: [phone]

## 2024-03-26 ENCOUNTER — Telehealth: Admitting: Licensed Clinical Social Worker
